# Patient Record
Sex: Male | Born: 1950
Health system: Southern US, Community
[De-identification: ages and names within clinical notes are randomized; demographics above are authoritative.]

## PROBLEM LIST (undated history)

## (undated) DIAGNOSIS — E119 Type 2 diabetes mellitus without complications: Secondary | ICD-10-CM

## (undated) DIAGNOSIS — K219 Gastro-esophageal reflux disease without esophagitis: Secondary | ICD-10-CM

## (undated) DIAGNOSIS — F79 Unspecified intellectual disabilities: Secondary | ICD-10-CM

## (undated) HISTORY — DX: Gastro-esophageal reflux disease without esophagitis: K21.9

## (undated) HISTORY — PX: OTHER SURGICAL HISTORY: SHX169

## (undated) HISTORY — PX: TONSILLECTOMY: SUR1361

## (undated) HISTORY — DX: Type 2 diabetes mellitus without complications: E11.9

## (undated) HISTORY — PX: ANKLE FRACTURE SURGERY: SHX122

## (undated) HISTORY — DX: Unspecified intellectual disabilities: F79

---

## 2005-10-14 ENCOUNTER — Ambulatory Visit: Payer: Self-pay | Admitting: Internal Medicine

## 2005-10-30 HISTORY — PX: ESOPHAGOGASTRODUODENOSCOPY: SHX1529

## 2005-11-03 ENCOUNTER — Ambulatory Visit (HOSPITAL_COMMUNITY): Admission: RE | Admit: 2005-11-03 | Discharge: 2005-11-03 | Payer: Self-pay | Admitting: Internal Medicine

## 2005-11-03 ENCOUNTER — Ambulatory Visit: Payer: Self-pay | Admitting: Internal Medicine

## 2010-12-20 ENCOUNTER — Encounter (INDEPENDENT_AMBULATORY_CARE_PROVIDER_SITE_OTHER): Payer: Self-pay | Admitting: Internal Medicine

## 2011-04-10 ENCOUNTER — Emergency Department (HOSPITAL_COMMUNITY): Payer: Medicare HMO

## 2011-04-10 ENCOUNTER — Emergency Department (HOSPITAL_COMMUNITY)
Admission: EM | Admit: 2011-04-10 | Discharge: 2011-04-10 | Disposition: A | Discharge status: Home / Self Care | Payer: Medicare HMO | Attending: Emergency Medicine | Admitting: Emergency Medicine

## 2011-04-10 DIAGNOSIS — IMO0002 Reserved for concepts with insufficient information to code with codable children: Secondary | ICD-10-CM | POA: Insufficient documentation

## 2011-04-10 DIAGNOSIS — S0990XA Unspecified injury of head, initial encounter: Secondary | ICD-10-CM | POA: Insufficient documentation

## 2011-04-10 DIAGNOSIS — S0180XA Unspecified open wound of other part of head, initial encounter: Secondary | ICD-10-CM | POA: Insufficient documentation

## 2011-04-29 ENCOUNTER — Encounter: Payer: Self-pay | Admitting: Gastroenterology

## 2011-04-29 ENCOUNTER — Ambulatory Visit (INDEPENDENT_AMBULATORY_CARE_PROVIDER_SITE_OTHER): Payer: Medicare HMO | Admitting: Gastroenterology

## 2011-04-29 VITALS — BP 140/77 | HR 66 | Temp 98.4°F | Ht 72.0 in | Wt 197.4 lb

## 2011-04-29 DIAGNOSIS — R197 Diarrhea, unspecified: Secondary | ICD-10-CM

## 2011-04-29 DIAGNOSIS — R1319 Other dysphagia: Secondary | ICD-10-CM

## 2011-04-29 DIAGNOSIS — R1314 Dysphagia, pharyngoesophageal phase: Secondary | ICD-10-CM

## 2011-04-29 DIAGNOSIS — R1310 Dysphagia, unspecified: Secondary | ICD-10-CM | POA: Insufficient documentation

## 2011-04-29 DIAGNOSIS — Z1211 Encounter for screening for malignant neoplasm of colon: Secondary | ICD-10-CM | POA: Insufficient documentation

## 2011-04-29 DIAGNOSIS — K219 Gastro-esophageal reflux disease without esophagitis: Secondary | ICD-10-CM

## 2011-04-29 NOTE — Assessment & Plan Note (Signed)
Chronic postprandial fecal urgency and loose stool. Bowel movement within 45 minutes after each meal. No prior colonoscopy. Recommend colonoscopy primarily for colorectal cancer screening. I have discussed the risks, alternatives, benefits with regards to but not limited to the risk of reaction to medication, bleeding, infection, perforation and the patient is agreeable to proceed. Written consent to be obtained.

## 2011-04-29 NOTE — Progress Notes (Signed)
Primary Care Physician:  Purcell Nails, MD  Primary Gastroenterologist:  Roetta Sessions, MD  Chief Complaint  Patient presents with  . Dysphagia    vomiting when eating sometimes  . Hiatal Hernia  . Diarrhea    HPI:  Henry [name] is a 60 y.o. male to schedule colonoscopy at the request of Dr. Fransico Him. He has never had a colonoscopy. He has problems with postprandial fecal urgency and "blow outs". No melena, brbpr. No real abdominal pain. Appetite good. No weight loss. No heartburn/indigestion. Vomiting most days with meals. Sisters thinks is because of a hiatal hernia. Patient does complain of some dysphagia. Has a history of esophageal stricture requiring dilation. EGD report by Dr. Karilyn Cota back in 2006 mentioned something about achalasia but never had the followup barium pill esophagram.     Current Outpatient Prescriptions  Medication Sig Dispense Refill  . diphenhydramine-acetaminophen (TYLENOL PM) 25-500 MG TABS Take 1 tablet by mouth at bedtime as needed.        Marland Kitchen omeprazole (PRILOSEC) 20 MG capsule Take 20 mg by mouth daily.          Allergies as of 04/29/2011  . (No Known Allergies)    Past Medical History  Diagnosis Date  . Mental disability     Past Surgical History  Procedure Date  . Tonsillectomy   . Cervical fracture     wore halo brace  . Ankle fracture surgery     pin  . Esophagogastroduodenoscopy 12/06    distal esophageal narrowing s/p dilation up to 19mm. ?achalasia?    Family History  Problem Relation Age of Onset  . Colon cancer Neg Hx     History   Social History  . Marital Status: Single    Spouse Name: N/A    Number of Children: 0  . Years of Education: N/A   Occupational History  . unemployed    Social History Main Topics  . Smoking status: Never Smoker   . Smokeless tobacco: Current User    Types: Snuff   Comment: uses dip  . Alcohol Use: No     one beer a week  . Drug Use: No  . Sexually Active: Not on file   Other  Topics Concern  . Not on file   Social History Narrative   Lives with sister.      ROS:  General: Negative for anorexia, weight loss, fever, chills, fatigue, weakness. Eyes: Negative for vision changes.  ENT: Negative for hoarseness, difficulty swallowing , nasal congestion. CV: Negative for chest pain, angina, palpitations, dyspnea on exertion, peripheral edema.  Respiratory: Negative for dyspnea at rest, dyspnea on exertion, cough, sputum, wheezing.  GI: See history of present illness. GU:  Negative for dysuria, hematuria, urinary incontinence, urinary frequency, nocturnal urination.  MS: Negative for joint pain, low back pain.  Derm: Negative for rash or itching.  Neuro: Negative for weakness, abnormal sensation, seizure, frequent headaches, memory loss, confusion.  Psych: Negative for anxiety, depression, suicidal ideation, hallucinations.  Endo: Negative for unusual weight change.  Heme: Negative for bruising or bleeding. Allergy: Negative for rash or hives.    Physical Examination:  BP 140/77  Pulse 66  Temp(Src) 98.4 F (36.9 C) (Temporal)  Ht 6' (1.829 m)  Wt 197 lb 6.4 oz (89.54 kg)  BMI 26.77 kg/m2   General: Well-nourished, well-developed in no acute distress.  Head: Normocephalic, atraumatic.   Eyes: Conjunctiva pink, no icterus. Mouth: Oropharyngeal mucosa moist and pink , no lesions erythema or exudate.  Neck: Supple without thyromegaly, masses, or lymphadenopathy.  Lungs: Clear to auscultation bilaterally.  Heart: Regular rate and rhythm, no murmurs rubs or gallops.  Abdomen: Bowel sounds are normal, nontender, nondistended, no hepatosplenomegaly or masses, no abdominal bruits or    hernia , no rebound or guarding.   Extremities: No lower extremity edema.  Neuro: Alert and oriented x 4 , grossly normal neurologically.  Skin: Warm and dry, no rash or jaundice.   Psych: Alert and cooperative, normal mood and affect.

## 2011-04-29 NOTE — Assessment & Plan Note (Signed)
Postprandial vomiting. Patient mentally disabled but does have a history of subtle stricture. Suspect recurrent esophageal stricture versus achalasia. Patient denies associated abdominal pain. Recommend EGD with dilation in the near future. I have discussed the risks, alternatives, benefits with regards to but not limited to the risk of reaction to medication, bleeding, infection, perforation and the patient is agreeable to proceed. Written consent to be obtained.

## 2011-04-29 NOTE — Assessment & Plan Note (Signed)
Typical GERD symptoms well controlled with PPI. Continue current regimen.

## 2011-04-30 NOTE — Progress Notes (Signed)
Cc to PCP 

## 2011-05-18 ENCOUNTER — Other Ambulatory Visit: Payer: Self-pay | Admitting: Internal Medicine

## 2011-05-18 ENCOUNTER — Ambulatory Visit (HOSPITAL_COMMUNITY)
Admission: RE | Admit: 2011-05-18 | Discharge: 2011-05-18 | Disposition: A | Discharge status: Home / Self Care | Payer: Medicare HMO | Source: Ambulatory Visit | Attending: Internal Medicine | Admitting: Internal Medicine

## 2011-05-18 ENCOUNTER — Encounter: Payer: Medicare HMO | Admitting: Internal Medicine

## 2011-05-18 DIAGNOSIS — K5730 Diverticulosis of large intestine without perforation or abscess without bleeding: Secondary | ICD-10-CM

## 2011-05-18 DIAGNOSIS — Z1211 Encounter for screening for malignant neoplasm of colon: Secondary | ICD-10-CM

## 2011-05-18 DIAGNOSIS — R197 Diarrhea, unspecified: Secondary | ICD-10-CM | POA: Insufficient documentation

## 2011-05-18 DIAGNOSIS — K222 Esophageal obstruction: Secondary | ICD-10-CM | POA: Insufficient documentation

## 2011-05-18 DIAGNOSIS — R1310 Dysphagia, unspecified: Secondary | ICD-10-CM

## 2011-05-18 DIAGNOSIS — K219 Gastro-esophageal reflux disease without esophagitis: Secondary | ICD-10-CM

## 2011-05-18 DIAGNOSIS — K2980 Duodenitis without bleeding: Secondary | ICD-10-CM | POA: Insufficient documentation

## 2011-05-18 HISTORY — PX: COLONOSCOPY: SHX174

## 2011-05-18 HISTORY — PX: ESOPHAGOGASTRODUODENOSCOPY: SHX1529

## 2011-06-15 NOTE — Op Note (Signed)
Henry [name], Henry [name]                  ACCOUNT NO.:  [id number]  MEDICAL RECORD NO.:  [id number]  LOCATION:  [facility]                          FACILITY:  [facility]  PHYSICIAN:  R. Roetta Sessions, MD FACP FACGDATE OF BIRTH:  12-27-1950  DATE OF PROCEDURE:  05/18/2011 DATE OF DISCHARGE:                              OPERATIVE REPORT   PROCEDURE:  Esophagogastroduodenoscopy with Elease Hashimoto dilation followed by a small bowel biopsy, followed by ileocolonoscopy screening.  INDICATIONS FOR PROCEDURE:  A 60 year old gentleman with intermittent esophageal dysphagia in the background setting, on background of chronic GERD, well controlled with omeprazole 20 mg orally daily.  Here for further evaluation of intermittent esophageal dysphagia, intermittent postprandial loose stools, but no chronic diarrhea, hematochezia, or other change in bowel function.  No family history of colon cancer.  No prior colonoscopy.  EGD and colonoscopy now being done.  Risks, benefits, limitations, alternatives, and imponderables have been discussed, questions answered.  Please see the documentation medical record.  PROCEDURE NOTE:  O2 saturation, blood pressure, pulse, and respirations were monitored throughout the entirety of the procedure.  CONSCIOUS SEDATION:  Versed 7 mg IV and Demerol 50 mg IV in divided doses.  INSTRUMENT:  Pentax video chip system.  Cetacaine spray for topical pharyngeal anesthesia.  FINDINGS:  Examination of the tubular esophagus revealed normal mucosa. There was a submucosal ring at the GE junction which the scope "caught on" on passage of the EG junction and this appeared to be benign finding.  The EG junction was patent at this level, however.  Stomach: Gastric cavity was emptied and insufflated well with air.  Thorough examination of the gastric mucosa including retroflex view of proximal stomach and esophagogastric junction demonstrated a 45-cm hiatal hernia only.  Pylorus was patent, easily  traversed.  Examination of the bulb, second and third portion revealed a questionable scalloping of some of the tips of folds, otherwise mucosa appeared normal. Therapeutic/diagnostic maneuvers performed.  Scope was withdrawn.  A 61- French Maloney dilator passed full insertion and a 58-French Maloney dilator subsequently passed with slight resistance.  Look back revealed rupturing of the ring at the 5 o'clock position without apparent complication and minimal bleeding.  Subsequent biopsies of the duodenal mucosa were taken for histology.  The patient tolerated this procedure and was prepared for colonoscopy.  Digital rectal exam revealed no abnormalities.  Endoscopic findings, prep was good.  Colon:  Colonic mucosa was surveyed from the rectosigmoid junction through the left transverse right colon to the appendiceal orifice, ileocecal valve/cecum.  These structures were well seen and photographed for the record.  Terminal was intubated to 5 cm.  From this level, scope was slowly and cautiously withdrawn.  All previously mentioned mucosal surfaces were seen.  The patient had left-sided diverticulum.  Remaining colonic mucosa appeared normal.  There were 3 capsule fragments in the base of the cecum which were not totally washout or I could not pull them way but the cecal mucosa was felt to have been seen adequately. The remainder of the colonic mucosa appeared normal as did terminal mucosa.  The scope was pulled down the rectum where thorough examination of the rectal mucosa including retroflex  view of the anal verge demonstrated no abnormalities.  The patient tolerated the procedure well.  Cecal withdrawal time 7 minutes.  IMPRESSION: 1. Esophagogastroduodenoscopy, submucosal, esophageal ring in the GE     junction status post disruption as described above, otherwise     normal-appearing esophagus. 2. Hiatal hernia, otherwise normal stomach, questions scalloping at D2     and D3,  status post biopsy.  Colonoscopy findings, normal rectum,     left-sided diverticula.  Remaining of the colonic mucosa and     terminal mucosa appeared normal.  RECOMMENDATIONS: 1. Continue omeprazole 20 mg orally daily. 2. Add Align probiotic 1 capsule daily to regimen to help normalize     bowel function. 3. Repeat screening colonoscopy in 10 years. 4. Literature on diverticulosis, GERD and hiatal hernia provided to     Mr. Orea 5. Follow up on path.     Jonathon Bellows, MD Caleen Essex     RMR/MEDQ  D:  05/18/2011  T:  05/18/2011  Job:  [id number]  cc:   Purcell Nails, MD Fax: [phone]  Electronically Signed by Lorrin Goodell M.D. on 06/15/2011 09:18:35 AM

## 2017-02-04 DIAGNOSIS — J189 Pneumonia, unspecified organism: Secondary | ICD-10-CM | POA: Diagnosis not present

## 2017-02-04 DIAGNOSIS — N390 Urinary tract infection, site not specified: Secondary | ICD-10-CM | POA: Diagnosis not present

## 2017-02-08 DIAGNOSIS — J189 Pneumonia, unspecified organism: Secondary | ICD-10-CM | POA: Diagnosis not present

## 2017-02-08 DIAGNOSIS — H6123 Impacted cerumen, bilateral: Secondary | ICD-10-CM | POA: Diagnosis not present

## 2017-02-08 DIAGNOSIS — N390 Urinary tract infection, site not specified: Secondary | ICD-10-CM | POA: Diagnosis not present

## 2017-03-05 DIAGNOSIS — L728 Other follicular cysts of the skin and subcutaneous tissue: Secondary | ICD-10-CM | POA: Diagnosis not present

## 2017-03-05 DIAGNOSIS — L814 Other melanin hyperpigmentation: Secondary | ICD-10-CM | POA: Diagnosis not present

## 2017-03-05 DIAGNOSIS — D485 Neoplasm of uncertain behavior of skin: Secondary | ICD-10-CM | POA: Diagnosis not present

## 2017-03-05 DIAGNOSIS — L02411 Cutaneous abscess of right axilla: Secondary | ICD-10-CM | POA: Diagnosis not present

## 2017-03-05 DIAGNOSIS — L570 Actinic keratosis: Secondary | ICD-10-CM | POA: Diagnosis not present

## 2017-04-15 DIAGNOSIS — Z6826 Body mass index (BMI) 26.0-26.9, adult: Secondary | ICD-10-CM | POA: Diagnosis not present

## 2017-04-15 DIAGNOSIS — C4490 Unspecified malignant neoplasm of skin, unspecified: Secondary | ICD-10-CM | POA: Diagnosis not present

## 2017-04-21 DIAGNOSIS — L03116 Cellulitis of left lower limb: Secondary | ICD-10-CM | POA: Diagnosis not present

## 2017-04-21 DIAGNOSIS — Z6827 Body mass index (BMI) 27.0-27.9, adult: Secondary | ICD-10-CM | POA: Diagnosis not present

## 2017-05-06 DIAGNOSIS — E162 Hypoglycemia, unspecified: Secondary | ICD-10-CM | POA: Diagnosis not present

## 2017-05-06 DIAGNOSIS — K219 Gastro-esophageal reflux disease without esophagitis: Secondary | ICD-10-CM | POA: Diagnosis not present

## 2017-05-10 DIAGNOSIS — F79 Unspecified intellectual disabilities: Secondary | ICD-10-CM | POA: Diagnosis not present

## 2017-05-10 DIAGNOSIS — Z0001 Encounter for general adult medical examination with abnormal findings: Secondary | ICD-10-CM | POA: Diagnosis not present

## 2017-05-10 DIAGNOSIS — K219 Gastro-esophageal reflux disease without esophagitis: Secondary | ICD-10-CM | POA: Diagnosis not present

## 2017-05-10 DIAGNOSIS — Z6827 Body mass index (BMI) 27.0-27.9, adult: Secondary | ICD-10-CM | POA: Diagnosis not present

## 2017-05-26 ENCOUNTER — Telehealth: Payer: Self-pay

## 2017-05-26 NOTE — Telephone Encounter (Signed)
[phone] patient sister freda called to schedule his tcs   Received letter

## 2017-05-27 NOTE — Telephone Encounter (Signed)
I spoke to [place] and she said pt is not having any problems now and no family hx of colon cancer.  She was just not sure when his next colonoscopy was due. Last one was done by Dr. Gala Romney on 05/18/2011 and he recommended the next one in 10 years. She is aware pt is on Recall.

## 2017-07-08 DIAGNOSIS — C4442 Squamous cell carcinoma of skin of scalp and neck: Secondary | ICD-10-CM | POA: Diagnosis not present

## 2017-07-08 DIAGNOSIS — Z6827 Body mass index (BMI) 27.0-27.9, adult: Secondary | ICD-10-CM | POA: Diagnosis not present

## 2017-10-04 DIAGNOSIS — M25551 Pain in right hip: Secondary | ICD-10-CM | POA: Diagnosis not present

## 2017-10-04 DIAGNOSIS — Z6827 Body mass index (BMI) 27.0-27.9, adult: Secondary | ICD-10-CM | POA: Diagnosis not present

## 2018-05-19 DIAGNOSIS — E162 Hypoglycemia, unspecified: Secondary | ICD-10-CM | POA: Diagnosis not present

## 2018-05-19 DIAGNOSIS — K219 Gastro-esophageal reflux disease without esophagitis: Secondary | ICD-10-CM | POA: Diagnosis not present

## 2018-05-19 DIAGNOSIS — C4442 Squamous cell carcinoma of skin of scalp and neck: Secondary | ICD-10-CM | POA: Diagnosis not present

## 2018-05-19 DIAGNOSIS — F79 Unspecified intellectual disabilities: Secondary | ICD-10-CM | POA: Diagnosis not present

## 2018-05-24 DIAGNOSIS — Z6828 Body mass index (BMI) 28.0-28.9, adult: Secondary | ICD-10-CM | POA: Diagnosis not present

## 2018-05-24 DIAGNOSIS — Z0001 Encounter for general adult medical examination with abnormal findings: Secondary | ICD-10-CM | POA: Diagnosis not present

## 2018-05-24 DIAGNOSIS — K219 Gastro-esophageal reflux disease without esophagitis: Secondary | ICD-10-CM | POA: Diagnosis not present

## 2018-05-24 DIAGNOSIS — Z23 Encounter for immunization: Secondary | ICD-10-CM | POA: Diagnosis not present

## 2018-05-24 DIAGNOSIS — Z1339 Encounter for screening examination for other mental health and behavioral disorders: Secondary | ICD-10-CM | POA: Diagnosis not present

## 2018-05-24 DIAGNOSIS — F79 Unspecified intellectual disabilities: Secondary | ICD-10-CM | POA: Diagnosis not present

## 2019-06-14 DIAGNOSIS — F79 Unspecified intellectual disabilities: Secondary | ICD-10-CM | POA: Diagnosis not present

## 2019-06-14 DIAGNOSIS — K219 Gastro-esophageal reflux disease without esophagitis: Secondary | ICD-10-CM | POA: Diagnosis not present

## 2019-06-14 DIAGNOSIS — E162 Hypoglycemia, unspecified: Secondary | ICD-10-CM | POA: Diagnosis not present

## 2019-06-21 DIAGNOSIS — C4442 Squamous cell carcinoma of skin of scalp and neck: Secondary | ICD-10-CM | POA: Diagnosis not present

## 2019-06-21 DIAGNOSIS — Z6828 Body mass index (BMI) 28.0-28.9, adult: Secondary | ICD-10-CM | POA: Diagnosis not present

## 2019-06-21 DIAGNOSIS — Z0001 Encounter for general adult medical examination with abnormal findings: Secondary | ICD-10-CM | POA: Diagnosis not present

## 2019-06-21 DIAGNOSIS — H9190 Unspecified hearing loss, unspecified ear: Secondary | ICD-10-CM | POA: Diagnosis not present

## 2019-06-21 DIAGNOSIS — K449 Diaphragmatic hernia without obstruction or gangrene: Secondary | ICD-10-CM | POA: Diagnosis not present

## 2019-06-21 DIAGNOSIS — K219 Gastro-esophageal reflux disease without esophagitis: Secondary | ICD-10-CM | POA: Diagnosis not present

## 2019-06-21 DIAGNOSIS — R7303 Prediabetes: Secondary | ICD-10-CM | POA: Diagnosis not present

## 2019-06-21 DIAGNOSIS — K5730 Diverticulosis of large intestine without perforation or abscess without bleeding: Secondary | ICD-10-CM | POA: Diagnosis not present

## 2019-10-25 ENCOUNTER — Other Ambulatory Visit: Payer: Self-pay

## 2019-10-31 HISTORY — PX: ESOPHAGOGASTRODUODENOSCOPY: SHX1529

## 2019-11-24 DIAGNOSIS — Z20828 Contact with and (suspected) exposure to other viral communicable diseases: Secondary | ICD-10-CM | POA: Diagnosis not present

## 2019-11-24 DIAGNOSIS — R633 Feeding difficulties: Secondary | ICD-10-CM | POA: Diagnosis not present

## 2019-11-24 DIAGNOSIS — H109 Unspecified conjunctivitis: Secondary | ICD-10-CM | POA: Diagnosis not present

## 2019-11-24 DIAGNOSIS — Z79899 Other long term (current) drug therapy: Secondary | ICD-10-CM | POA: Diagnosis not present

## 2019-11-24 DIAGNOSIS — M6281 Muscle weakness (generalized): Secondary | ICD-10-CM | POA: Diagnosis not present

## 2019-11-24 DIAGNOSIS — K219 Gastro-esophageal reflux disease without esophagitis: Secondary | ICD-10-CM | POA: Diagnosis not present

## 2019-11-24 DIAGNOSIS — K224 Dyskinesia of esophagus: Secondary | ICD-10-CM | POA: Diagnosis not present

## 2019-11-24 DIAGNOSIS — R29810 Facial weakness: Secondary | ICD-10-CM | POA: Diagnosis not present

## 2019-11-24 DIAGNOSIS — R2990 Unspecified symptoms and signs involving the nervous system: Secondary | ICD-10-CM | POA: Diagnosis not present

## 2019-11-24 DIAGNOSIS — Z23 Encounter for immunization: Secondary | ICD-10-CM | POA: Diagnosis not present

## 2019-11-24 DIAGNOSIS — R1312 Dysphagia, oropharyngeal phase: Secondary | ICD-10-CM | POA: Diagnosis not present

## 2019-11-24 DIAGNOSIS — R1310 Dysphagia, unspecified: Secondary | ICD-10-CM | POA: Diagnosis not present

## 2019-11-24 DIAGNOSIS — R7303 Prediabetes: Secondary | ICD-10-CM | POA: Diagnosis not present

## 2019-11-24 DIAGNOSIS — G510 Bell's palsy: Secondary | ICD-10-CM | POA: Diagnosis not present

## 2019-11-24 DIAGNOSIS — K228 Other specified diseases of esophagus: Secondary | ICD-10-CM | POA: Diagnosis not present

## 2019-11-24 DIAGNOSIS — Z8249 Family history of ischemic heart disease and other diseases of the circulatory system: Secondary | ICD-10-CM | POA: Diagnosis not present

## 2019-11-24 DIAGNOSIS — F17290 Nicotine dependence, other tobacco product, uncomplicated: Secondary | ICD-10-CM | POA: Diagnosis not present

## 2019-11-24 DIAGNOSIS — H6123 Impacted cerumen, bilateral: Secondary | ICD-10-CM | POA: Diagnosis not present

## 2019-11-24 DIAGNOSIS — F79 Unspecified intellectual disabilities: Secondary | ICD-10-CM | POA: Diagnosis not present

## 2019-11-24 DIAGNOSIS — R4189 Other symptoms and signs involving cognitive functions and awareness: Secondary | ICD-10-CM | POA: Diagnosis not present

## 2019-11-24 DIAGNOSIS — I6523 Occlusion and stenosis of bilateral carotid arteries: Secondary | ICD-10-CM | POA: Diagnosis not present

## 2019-11-24 DIAGNOSIS — Z83438 Family history of other disorder of lipoprotein metabolism and other lipidemia: Secondary | ICD-10-CM | POA: Diagnosis not present

## 2019-11-24 DIAGNOSIS — K449 Diaphragmatic hernia without obstruction or gangrene: Secondary | ICD-10-CM | POA: Diagnosis not present

## 2019-11-24 DIAGNOSIS — R29818 Other symptoms and signs involving the nervous system: Secondary | ICD-10-CM | POA: Diagnosis not present

## 2019-11-25 DIAGNOSIS — R29810 Facial weakness: Secondary | ICD-10-CM | POA: Diagnosis not present

## 2019-11-25 DIAGNOSIS — F79 Unspecified intellectual disabilities: Secondary | ICD-10-CM | POA: Diagnosis not present

## 2019-11-25 DIAGNOSIS — R29818 Other symptoms and signs involving the nervous system: Secondary | ICD-10-CM | POA: Diagnosis not present

## 2019-11-25 DIAGNOSIS — R633 Feeding difficulties: Secondary | ICD-10-CM | POA: Diagnosis not present

## 2019-11-25 DIAGNOSIS — R1310 Dysphagia, unspecified: Secondary | ICD-10-CM | POA: Diagnosis not present

## 2019-11-25 DIAGNOSIS — I6523 Occlusion and stenosis of bilateral carotid arteries: Secondary | ICD-10-CM | POA: Diagnosis not present

## 2019-11-26 DIAGNOSIS — R29810 Facial weakness: Secondary | ICD-10-CM | POA: Diagnosis not present

## 2019-11-27 DIAGNOSIS — K219 Gastro-esophageal reflux disease without esophagitis: Secondary | ICD-10-CM | POA: Diagnosis not present

## 2019-11-27 DIAGNOSIS — I6523 Occlusion and stenosis of bilateral carotid arteries: Secondary | ICD-10-CM | POA: Diagnosis not present

## 2019-11-27 DIAGNOSIS — G510 Bell's palsy: Secondary | ICD-10-CM | POA: Diagnosis not present

## 2019-11-27 DIAGNOSIS — R633 Feeding difficulties: Secondary | ICD-10-CM | POA: Diagnosis not present

## 2019-11-27 DIAGNOSIS — K224 Dyskinesia of esophagus: Secondary | ICD-10-CM | POA: Diagnosis not present

## 2019-11-27 DIAGNOSIS — R29810 Facial weakness: Secondary | ICD-10-CM | POA: Diagnosis not present

## 2019-11-27 DIAGNOSIS — R4189 Other symptoms and signs involving cognitive functions and awareness: Secondary | ICD-10-CM | POA: Diagnosis not present

## 2019-11-27 DIAGNOSIS — R1310 Dysphagia, unspecified: Secondary | ICD-10-CM | POA: Diagnosis not present

## 2019-11-27 DIAGNOSIS — R29818 Other symptoms and signs involving the nervous system: Secondary | ICD-10-CM | POA: Diagnosis not present

## 2019-11-28 DIAGNOSIS — R1310 Dysphagia, unspecified: Secondary | ICD-10-CM | POA: Diagnosis not present

## 2019-11-28 DIAGNOSIS — K228 Other specified diseases of esophagus: Secondary | ICD-10-CM | POA: Diagnosis not present

## 2019-11-28 DIAGNOSIS — R29810 Facial weakness: Secondary | ICD-10-CM | POA: Diagnosis not present

## 2019-11-28 HISTORY — PX: ESOPHAGOGASTRODUODENOSCOPY: SHX1529

## 2020-01-04 DIAGNOSIS — R1310 Dysphagia, unspecified: Secondary | ICD-10-CM | POA: Diagnosis not present

## 2020-01-04 DIAGNOSIS — Z6828 Body mass index (BMI) 28.0-28.9, adult: Secondary | ICD-10-CM | POA: Diagnosis not present

## 2020-01-04 DIAGNOSIS — G510 Bell's palsy: Secondary | ICD-10-CM | POA: Diagnosis not present

## 2020-01-04 DIAGNOSIS — H18892 Other specified disorders of cornea, left eye: Secondary | ICD-10-CM | POA: Diagnosis not present

## 2020-01-04 DIAGNOSIS — K228 Other specified diseases of esophagus: Secondary | ICD-10-CM | POA: Diagnosis not present

## 2020-01-04 DIAGNOSIS — K219 Gastro-esophageal reflux disease without esophagitis: Secondary | ICD-10-CM | POA: Diagnosis not present

## 2020-01-15 ENCOUNTER — Telehealth (HOSPITAL_COMMUNITY): Payer: Self-pay | Admitting: Speech Pathology

## 2020-01-15 NOTE — Telephone Encounter (Signed)
L/m to cx this apptment due to needing clairification from MD it patient needs a MBSS.

## 2020-01-15 NOTE — Telephone Encounter (Signed)
Called to cx and rs once we get clear inforamtion from MD no answer will call again lat

## 2020-01-18 ENCOUNTER — Encounter (HOSPITAL_COMMUNITY): Payer: Self-pay

## 2020-01-18 ENCOUNTER — Ambulatory Visit (HOSPITAL_COMMUNITY): Payer: PPO | Admitting: Speech Pathology

## 2020-01-18 ENCOUNTER — Ambulatory Visit (HOSPITAL_COMMUNITY): Payer: PPO | Attending: Family Medicine | Admitting: Speech Pathology

## 2020-01-18 DIAGNOSIS — R1319 Other dysphagia: Secondary | ICD-10-CM | POA: Insufficient documentation

## 2020-01-25 ENCOUNTER — Encounter (HOSPITAL_COMMUNITY): Payer: Self-pay | Admitting: Speech Pathology

## 2020-01-25 ENCOUNTER — Other Ambulatory Visit: Payer: Self-pay

## 2020-01-25 ENCOUNTER — Ambulatory Visit (HOSPITAL_COMMUNITY): Payer: PPO | Admitting: Speech Pathology

## 2020-01-25 DIAGNOSIS — R1319 Other dysphagia: Secondary | ICD-10-CM

## 2020-01-25 NOTE — Therapy (Signed)
[place], [place], [address] Phone: [phone]   Fax:  [phone]  Speech Language Pathology Evaluation/Clinical Swallow Evaluation  Patient Details  Name: Henry [name] Date of Birth: 19-Apr-1951 No data recorded  Encounter Date: 01/25/2020  End of Session - 01/25/20 1158    Visit Number  1    Number of Visits  1    Authorization Type  [employer]    SLP Start Time  1000    SLP Stop Time   1032    SLP Time Calculation (min)  32 min    Activity Tolerance  Patient tolerated treatment well       Past Medical History:  Diagnosis Date  . Mental disability     Past Surgical History:  Procedure Laterality Date  . ANKLE FRACTURE SURGERY     pin  . cervical fracture     wore halo brace  . ESOPHAGOGASTRODUODENOSCOPY  12/06   distal esophageal narrowing s/p dilation up to 69mm. ?achalasia?  . TONSILLECTOMY      There were no vitals filed for this visit.  Subjective Assessment - 01/25/20 1002    Subjective  "Sometimes I throw up after I eat."    Patient is accompained by:  Family member   Sister   Currently in Pain?  No/denies       Prior Functional Status - 01/25/20 1136      Prior Functional Status   Cognitive/Linguistic Baseline  Baseline deficits    Baseline deficit details  h/o developmental cognitive impairment    Available Help at Discharge  Family      General - 01/25/20 1150      General Information   Date of Onset  11/27/19    HPI  Henry [name] is a 69 yo male who was referred by Dr. Judd Lien for a clinical swallow evaluation due to h/o Bell's Palsy in December with completion of MBSS (11/27/19) and EGD (11/28/19) at [facility] in [place]). The MBSS reportedly showed penetration without aspiration and asymmetry with patulous pyriform recesses. EGD showed the lumen of the upper third of the esophagus and middle third of the esophagus moderately  dilated, no dilatable stricture seen. A recommendation for consideration of esophageal manometry was made to rule out achalasia vs nonspecific esophageal dysmotility. Pt was discharged home on a puree diet with NTL, however family resumed soft diet and thin liquids at the end of January. Pt is accompanied to today's evaluation by his sister, caregiver. Pt is HOH and has limited literacy skills. His sister reports that they supervise Henry [name] during meals to remind him to slow down. GI consult with Dr. Laural Golden is pending, per sister.    Type of Study  Bedside Swallow Evaluation    Previous Swallow Assessment  MBSS at [facility] 11/27/19 and EGD 11/28/19    Diet Prior to this Study  Dysphagia 3 (soft);Thin liquids    Temperature Spikes Noted  No    Respiratory Status  Room air    History of Recent Intubation  No    Behavior/Cognition  Alert;Cooperative;Pleasant mood    Oral Cavity Assessment  Within Functional Limits    Oral Care Completed by SLP  No    Oral Cavity - Dentition  Dentures, top;Dentures, bottom    Vision  Functional for self-feeding    Self-Feeding Abilities  Able to feed self    Patient Positioning  Upright in chair  Baseline Vocal Quality  Normal    Volitional Cough  Strong    Volitional Swallow  Able to elicit       Oral Motor/Sensory Function - 01/25/20 1156      Oral Motor/Sensory Function   Overall Oral Motor/Sensory Function  Within functional limits      Ice Chips - 01/25/20 1157      Ice Chips   Ice chips  Not tested      Thin Liquid - 01/25/20 1157      Thin Liquid   Thin Liquid  Within functional limits    Presentation  Cup;Straw;Self Fed        Puree - 01/25/20 1157      Puree   Presentation  Self Fed;Spoon      Solid - 01/25/20 1157      Solid   Solid  Within functional limits    Presentation  Self Fed      EATING ASSESSMENT TOOL (EAT-10)  The patient was asked to rate to what extent the following statements are  problematic on a scale of 0-4. 0 = No problem; 4 = Severe problem. A total score of 3 or higher is considered abnormal. My swallowing problem has caused me to lose weight. 0 My swallowing problem interferes with my ability to go out for meals. 2 Swallowing liquids takes extra effort. 0 Swallowing solids takes extra effort. 1 Swallowing pills takes extra effort. 1 Swallowing is painful. 0  The pleasure of eating is affected by my swallowing. 1 When I swallow food sticks in my throat. 0 I cough when I eat. 2 Swallowing is stressful. 0  TOTAL SCORE: 7 [x]  Abnormal (raw score >3) []  Normal   Pt's sister assisted in filling out the EAT-10 above  SLP Education - 01/25/20 1135    Education Details  Written and verbal information provided regarding aspiration, dysphagia, and esophageal precautions    Person(s) Educated  Patient;Caregiver(s)   Sister   Methods  Explanation;Handout    Comprehension  Verbalized understanding       SLP Short Term Goals - 01/25/20 1202      SLP SHORT TERM GOAL #1   Title  N/A; evaluation only         Plan - 01/25/20 1159    Clinical Impression Statement  Clinical swallow evaluation completed in room with sister present. Pt is HOH, limited literacy skills, and has developmental cognitive impairment, and has intermittent supervsion/assist from family primarily to encourage safe swallow precautions. Previous records from OSH were reviewed via [facility] tab (see above for test results). Oral motor examination is remarkable for U/L dentures and mild reduced coordination with lingual lateralization. Pt had previous neck injury and has reduced neck movement now. Pt shows no overt signs or symptoms of aspiration during presentation of thin liquids, puree, and regular textures. He reports occasional "vomiting" after consuming solid foods (sounds like regurgitation).   Pt and sister were provided information regarding aspiration, dysphagia, soft  solid recommendations, and reflux precautions. SLP suggested that family focus on having him eat slowly and take small bites to mitigate cognitive overload. Pt and family were appreciative of recommendations. Pt's symptoms appear more consistent with esophageal dysphagia and f/u with GI is recommended. His sister indicates that Henry [name] has had 4 episodes of PNA in the past few years, which could be due to post prandial aspiration given esophageal concerns. SLP can complete MBSS if MD desires, however do not feel it is indicated  at this time. Pt/family were given my contact information should they have further questions or concerns.    Treatment/Interventions  Patient/family education;SLP instruction and feedback;Aspiration precaution training    Potential to Achieve Goals  Good    SLP Home Exercise Plan  N/A; Evaluation only    Consulted and Agree with Plan of Care  Patient;Family member/caregiver    Family Member Consulted  Sister       Patient will benefit from skilled therapeutic intervention in order to improve the following deficits and impairments:   Other dysphagia    Problem List Patient Active Problem List   Diagnosis Date Noted  . GERD (gastroesophageal reflux disease) 04/29/2011  . Colon cancer screening 04/29/2011  . Esophageal dysphagia 04/29/2011  . Diarrhea 04/29/2011   Thank you,  Genene Churn, [place]  [facility] 01/25/2020, 12:03 PM  [place] [address] [place], [place], [address] Phone: [phone]   Fax:  [phone]  Name: Henry [name] MRN: [name] Date of Birth: [birth year]

## 2020-03-04 ENCOUNTER — Encounter (INDEPENDENT_AMBULATORY_CARE_PROVIDER_SITE_OTHER): Payer: Self-pay | Admitting: Gastroenterology

## 2020-03-04 NOTE — Progress Notes (Signed)
Patient needs office visit regarding dysphagia

## 2020-06-10 ENCOUNTER — Ambulatory Visit (INDEPENDENT_AMBULATORY_CARE_PROVIDER_SITE_OTHER): Payer: PPO | Admitting: Gastroenterology

## 2020-06-21 DIAGNOSIS — K219 Gastro-esophageal reflux disease without esophagitis: Secondary | ICD-10-CM | POA: Diagnosis not present

## 2020-06-21 DIAGNOSIS — R5382 Chronic fatigue, unspecified: Secondary | ICD-10-CM | POA: Diagnosis not present

## 2020-06-21 DIAGNOSIS — Z13220 Encounter for screening for lipoid disorders: Secondary | ICD-10-CM | POA: Diagnosis not present

## 2020-06-21 DIAGNOSIS — K5730 Diverticulosis of large intestine without perforation or abscess without bleeding: Secondary | ICD-10-CM | POA: Diagnosis not present

## 2020-06-21 DIAGNOSIS — G510 Bell's palsy: Secondary | ICD-10-CM | POA: Diagnosis not present

## 2020-06-21 DIAGNOSIS — R7303 Prediabetes: Secondary | ICD-10-CM | POA: Diagnosis not present

## 2020-06-25 DIAGNOSIS — F79 Unspecified intellectual disabilities: Secondary | ICD-10-CM | POA: Diagnosis not present

## 2020-06-25 DIAGNOSIS — Z0001 Encounter for general adult medical examination with abnormal findings: Secondary | ICD-10-CM | POA: Diagnosis not present

## 2020-06-25 DIAGNOSIS — E1165 Type 2 diabetes mellitus with hyperglycemia: Secondary | ICD-10-CM | POA: Diagnosis not present

## 2020-06-25 DIAGNOSIS — R1310 Dysphagia, unspecified: Secondary | ICD-10-CM | POA: Diagnosis not present

## 2020-06-25 DIAGNOSIS — G510 Bell's palsy: Secondary | ICD-10-CM | POA: Diagnosis not present

## 2020-06-25 DIAGNOSIS — H6123 Impacted cerumen, bilateral: Secondary | ICD-10-CM | POA: Diagnosis not present

## 2020-06-25 DIAGNOSIS — Z23 Encounter for immunization: Secondary | ICD-10-CM | POA: Diagnosis not present

## 2020-06-25 DIAGNOSIS — T162XXA Foreign body in left ear, initial encounter: Secondary | ICD-10-CM | POA: Diagnosis not present

## 2021-01-20 DIAGNOSIS — E162 Hypoglycemia, unspecified: Secondary | ICD-10-CM | POA: Diagnosis not present

## 2021-01-20 DIAGNOSIS — Z13220 Encounter for screening for lipoid disorders: Secondary | ICD-10-CM | POA: Diagnosis not present

## 2021-01-20 DIAGNOSIS — K219 Gastro-esophageal reflux disease without esophagitis: Secondary | ICD-10-CM | POA: Diagnosis not present

## 2021-01-20 DIAGNOSIS — R5382 Chronic fatigue, unspecified: Secondary | ICD-10-CM | POA: Diagnosis not present

## 2021-01-20 DIAGNOSIS — E1165 Type 2 diabetes mellitus with hyperglycemia: Secondary | ICD-10-CM | POA: Diagnosis not present

## 2021-01-20 DIAGNOSIS — R7303 Prediabetes: Secondary | ICD-10-CM | POA: Diagnosis not present

## 2021-02-28 DIAGNOSIS — R1310 Dysphagia, unspecified: Secondary | ICD-10-CM | POA: Diagnosis not present

## 2021-02-28 DIAGNOSIS — Z8616 Personal history of COVID-19: Secondary | ICD-10-CM | POA: Diagnosis not present

## 2021-02-28 DIAGNOSIS — K219 Gastro-esophageal reflux disease without esophagitis: Secondary | ICD-10-CM | POA: Diagnosis not present

## 2021-02-28 DIAGNOSIS — Z6826 Body mass index (BMI) 26.0-26.9, adult: Secondary | ICD-10-CM | POA: Diagnosis not present

## 2021-02-28 DIAGNOSIS — K5730 Diverticulosis of large intestine without perforation or abscess without bleeding: Secondary | ICD-10-CM | POA: Diagnosis not present

## 2021-02-28 DIAGNOSIS — E1165 Type 2 diabetes mellitus with hyperglycemia: Secondary | ICD-10-CM | POA: Diagnosis not present

## 2021-02-28 DIAGNOSIS — H9190 Unspecified hearing loss, unspecified ear: Secondary | ICD-10-CM | POA: Diagnosis not present

## 2021-02-28 DIAGNOSIS — F79 Unspecified intellectual disabilities: Secondary | ICD-10-CM | POA: Diagnosis not present

## 2021-03-03 ENCOUNTER — Encounter: Payer: Self-pay | Admitting: Internal Medicine

## 2021-05-26 ENCOUNTER — Encounter: Payer: Self-pay | Admitting: Internal Medicine

## 2021-07-04 DIAGNOSIS — R5382 Chronic fatigue, unspecified: Secondary | ICD-10-CM | POA: Diagnosis not present

## 2021-07-04 DIAGNOSIS — E162 Hypoglycemia, unspecified: Secondary | ICD-10-CM | POA: Diagnosis not present

## 2021-07-18 DIAGNOSIS — K219 Gastro-esophageal reflux disease without esophagitis: Secondary | ICD-10-CM | POA: Diagnosis not present

## 2021-07-18 DIAGNOSIS — Z6827 Body mass index (BMI) 27.0-27.9, adult: Secondary | ICD-10-CM | POA: Diagnosis not present

## 2021-07-18 DIAGNOSIS — R350 Frequency of micturition: Secondary | ICD-10-CM | POA: Diagnosis not present

## 2021-07-18 DIAGNOSIS — F79 Unspecified intellectual disabilities: Secondary | ICD-10-CM | POA: Diagnosis not present

## 2021-07-18 DIAGNOSIS — H9190 Unspecified hearing loss, unspecified ear: Secondary | ICD-10-CM | POA: Diagnosis not present

## 2021-07-18 DIAGNOSIS — Z1331 Encounter for screening for depression: Secondary | ICD-10-CM | POA: Diagnosis not present

## 2021-07-18 DIAGNOSIS — Z1389 Encounter for screening for other disorder: Secondary | ICD-10-CM | POA: Diagnosis not present

## 2021-07-18 DIAGNOSIS — E1165 Type 2 diabetes mellitus with hyperglycemia: Secondary | ICD-10-CM | POA: Diagnosis not present

## 2021-10-04 NOTE — Progress Notes (Deleted)
Referring Provider: Curlene Labrum, MD Primary Care Physician:  Curlene Labrum, MD Primary Gastroenterologist:  Dr. Gala Romney  No chief complaint on file.   HPI:   Henry [name] is a 70 y.o. male with intellectual disability, history of GERD, dysphagia, postprandial loose stools, presenting today at the request of Dr. Judd Lien for consult colonoscopy and heartburn.  We have not seen patient since the EGD and colonoscopy in 2012.  EGD 05/18/2011: Submucosal ring at GE junction s/p dilation, hiatal hernia, normal examined stomach, questionable scalloping at D2 and D3 s/p biopsy.  Pathology with chronic duodenitis consistent with peptic duodenitis. Colonoscopy 05/18/2011: Left sided diverticula, otherwise normal exam.  Repeat in 10 years.  Reviewed records in care everywhere: Modified barium swallow December 2020 with [facility] with transient penetration and no aspiration, asymmetric patulous piriform recess, significant residual barium within a mildly dilated thoracic esophagus which could represent a distal stricture.  [facility]phagia revealed moderate dilation of upper third of the esophagus and middle third of the esophagus s/p biopsied, otherwise normal exam.  Recommended considering manometry to rule out achalasia versus BPE. No dilation performed.    Today:        Past Medical History:  Diagnosis Date   Mental disability     Past Surgical History:  Procedure Laterality Date   ANKLE FRACTURE SURGERY     pin   cervical fracture     wore halo brace   ESOPHAGOGASTRODUODENOSCOPY  12/06   distal esophageal narrowing s/p dilation up to 64mm. ?achalasia?   TONSILLECTOMY      Current Outpatient Medications  Medication Sig Dispense Refill   diphenhydramine-acetaminophen (TYLENOL PM) 25-500 MG TABS Take 1 tablet by mouth at bedtime as needed.       omeprazole (PRILOSEC) 20 MG capsule Take 20 mg by mouth daily.        No current facility-administered medications for this visit.    Allergies as of 10/06/2021   (No Known Allergies)    Family History  Problem Relation Age of Onset   Colon cancer Neg Hx     Social History   Socioeconomic History   Marital status: Single    Spouse name: Not on file   Number of children: 0   Years of education: Not on file   Highest education level: Not on file  Occupational History   Occupation: unemployed  Tobacco Use   Smoking status: Never   Smokeless tobacco: Current    Types: Snuff   Tobacco comments:    uses dip  Substance and Sexual Activity   Alcohol use: No    Comment: one beer a week   Drug use: No   Sexual activity: Not on file  Other Topics Concern   Not on file  Social History Narrative   Lives with sister.   Social Determinants of Health   Financial Resource Strain: Not on file  Food Insecurity: Not on file  Transportation Needs: Not on file  Physical Activity: Not on file  Stress: Not on file  Social Connections: Not on file  Intimate Partner Violence: Not on file    Review of Systems: Gen: Denies any fever, chills, fatigue, weight loss, lack of appetite.  CV: Denies chest pain, heart palpitations, peripheral edema, syncope.  Resp: Denies shortness of breath at rest or with exertion. Denies wheezing or cough.  GI: Denies dysphagia or odynophagia. Denies jaundice, hematemesis, fecal incontinence. GU : Denies urinary burning,  urinary frequency, urinary hesitancy MS: Denies joint pain, muscle weakness, cramps, or limitation of movement.  Derm: Denies rash, itching, dry skin Psych: Denies depression, anxiety, memory loss, and confusion Heme: Denies bruising, bleeding, and enlarged lymph nodes.  Physical Exam: There were no vitals taken for this visit. General:   Alert and oriented. Pleasant and cooperative. Well-nourished and well-developed.  Head:  Normocephalic and atraumatic. Eyes:  Without icterus, sclera clear and  conjunctiva pink.  Ears:  Normal auditory acuity. Nose:  No deformity, discharge,  or lesions. Mouth:  No deformity or lesions, oral mucosa pink.  Neck:  Supple, without mass or thyromegaly. Lungs:  Clear to auscultation bilaterally. No wheezes, rales, or rhonchi. No distress.  Heart:  S1, S2 present without murmurs appreciated.  Abdomen:  +BS, soft, non-tender and non-distended. No HSM noted. No guarding or rebound. No masses appreciated.  Rectal:  Deferred  Msk:  Symmetrical without gross deformities. Normal posture. Pulses:  Normal pulses noted. Extremities:  Without clubbing or edema. Neurologic:  Alert and  oriented x4;  grossly normal neurologically. Skin:  Intact without significant lesions or rashes. Cervical Nodes:  No significant cervical adenopathy. Psych:  Alert and cooperative. Normal mood and affect.

## 2021-10-06 ENCOUNTER — Encounter: Payer: Self-pay | Admitting: Internal Medicine

## 2021-10-06 ENCOUNTER — Encounter: Payer: Self-pay | Admitting: Gastroenterology

## 2021-10-06 ENCOUNTER — Ambulatory Visit: Payer: PPO | Admitting: Gastroenterology

## 2021-10-07 ENCOUNTER — Encounter: Payer: Self-pay | Admitting: Internal Medicine

## 2021-11-27 NOTE — Progress Notes (Signed)
Referring Provider: Dr. Pleas Koch Primary Care Physician:  Curlene Labrum, MD Primary Gastroenterologist:  Dr. Gala Romney  Chief Complaint  Patient presents with   Gastroesophageal Reflux    Occ vomiting if eats too fast or tries to swallow too much   Colonoscopy    No problems    HPI:   Henry [name] is a 70 y.o. male presenting today at the request of Dr. Pleas Koch for consult colonoscopy and heartburn.   Patient has not been seen in our office since 2012.  He was scheduled for EGD and colonoscopy at that time due to postprandial vomiting and postprandial bowel movements.  Procedures were completed in June 2012.  EGD revealed submucosal ring at GE junction s/p dilation, hiatal hernia, question scalloping at D2 and D3 s/p biopsy.  Pathology revealed chronic duodenitis consistent with peptic duodenitis.  Colonoscopy with left-sided diverticula, otherwise normal exam. Recommended continuing omeprazole 20 mg daily, probiotic daily, and repeat colonoscopy in 10 years.  He was admitted in December 2020 at [facility] with left facial paralysis, stroke ruled out and diagnosed with Bell's Palsy. Also noted dysphagia. Completed modified barium swallow with significant residual barium within a mildly dilated thoracic esophagus which could represent a distal stricture, transient penetration without aspiration. Follow-up EGD with moderate dilation of upper and middle third of the esophagus s/p biopsied, no dilatable structure seen, normal examined stomach and duodenum.  Recommended considering esophageal manometry versus formal barium swallow with tablet to evaluate for achalasia morphology versus nonspecific esophageal dysmotility. I do not see any documentation of follow-up.   Today:  Overall presents with his sister today who helps provide history.  Patient is doing well overall.  Bowels moving well, 1-2 times per day.  Stools are soft and formed without BRBPR or melena.  Denies  abdominal pain or unintentional weight loss.  Typical reflux and Tums are well controlled on omeprazole 20 mg daily.  Continues with postprandial vomiting if eating or drinking too quickly.  He can feel foods/liquids sitting in his esophagus which is followed by vomiting.  As long as his sister is watching him and he eats slowly or drink slowly, he has no problems with this.  This is a chronic issue that has been going on for 12+ years without any significant change.  Does not eat within 3 hours of going to bed.  No nocturnal regurgitation/vomiting.  Denies hematemesis.  Rare use of Aleve.  Sister does not remember patient having any significant improvement in his dysphagia symptoms after dilation in 2012.  Plans to go to [place] in March for a couple of months.   Past Medical History:  Diagnosis Date   Diabetes ([place])    GERD (gastroesophageal reflux disease)    Mental disability     Past Surgical History:  Procedure Laterality Date   ANKLE FRACTURE SURGERY     pin   cervical fracture     wore halo brace   COLONOSCOPY  05/18/2011   Rourk; Left sided diverticula, otherwise normal exam.  Repeat in 10 years.   ESOPHAGOGASTRODUODENOSCOPY  10/30/2005   distal esophageal narrowing s/p dilation up to 51mm. ?achalasia?   ESOPHAGOGASTRODUODENOSCOPY  05/18/2011   Rourk; Submucosal ring at GE junction s/p dilation, hiatal hernia, normal examined stomach, questionable scalloping at D2 and D3 s/p biopsy.  Pathology with chronic duodenitis consistent with peptic duodenitis.   ESOPHAGOGASTRODUODENOSCOPY  10/2019   Dr. Staci Righter; moderate dilation of upper third of the esophagus and middle third of  the esophagus s/p biopsied, otherwise normal exam.  Recommended considering manometry to rule out achalasia versus BPE. No dilation performed.   ESOPHAGOGASTRODUODENOSCOPY  11/28/2019   Dr. Jill Alexanders; Moderate dilation of upper and middle third of the esophagus s/p biopsied, no dilatable stricture seen,  normal examined stomach and duodenum. Pathology was benign.   TONSILLECTOMY      Current Outpatient Medications  Medication Sig Dispense Refill   diphenhydramine-acetaminophen (TYLENOL PM) 25-500 MG TABS Take 1 tablet by mouth at bedtime as needed.     metFORMIN (GLUCOPHAGE) 500 MG tablet Take 500 mg by mouth 2 (two) times daily.     omeprazole (PRILOSEC) 20 MG capsule Take 20 mg by mouth daily.       No current facility-administered medications for this visit.    Allergies as of 11/28/2021   (No Known Allergies)    Family History  Problem Relation Age of Onset   Colon cancer Neg Hx     Social History   Socioeconomic History   Marital status: Single    Spouse name: Not on file   Number of children: 0   Years of education: Not on file   Highest education level: Not on file  Occupational History   Occupation: unemployed  Tobacco Use   Smoking status: Never   Smokeless tobacco: Former    Types: Snuff   Tobacco comments:    uses dip  Substance and Sexual Activity   Alcohol use: Yes    Comment: rarely   Drug use: No   Sexual activity: Not on file  Other Topics Concern   Not on file  Social History Narrative   Lives with sister.   Social Determinants of Health   Financial Resource Strain: Not on file  Food Insecurity: Not on file  Transportation Needs: Not on file  Physical Activity: Not on file  Stress: Not on file  Social Connections: Not on file  Intimate Partner Violence: Not on file    Review of Systems: Gen: Denies any fever, chills, cold or flulike symptoms, presyncope, syncope. CV: Denies chest pain, heart palpitations. Resp: Denies shortness of breath or cough. GI: See HPI GU : Denies urinary burning, urinary frequency, urinary hesitancy MS: Denies joint pain Derm: Denies rash Psych: Denies depression, anxiety Heme: See HPI  Physical Exam: BP 132/73    Pulse 72    Temp (!) 97.1 F (36.2 C) (Temporal)    Ht 5\' 11"  (1.803 m)    Wt 198 lb 9.6 oz  (90.1 kg)    BMI 27.70 kg/m  General:   Alert and oriented. Pleasant and cooperative. Well-nourished and well-developed.  Head:  Normocephalic and atraumatic. Eyes:  Without icterus, sclera clear and conjunctiva pink.  Ears:  Normal auditory acuity. Lungs:  Clear to auscultation bilaterally. No wheezes, rales, or rhonchi. No distress.  Heart:  S1, S2 present without murmurs appreciated.  Abdomen:  +BS, soft, non-tender and non-distended. No HSM noted. No guarding or rebound. No masses appreciated.  Rectal:  Deferred  Msk:  Symmetrical without gross deformities. Normal posture. Extremities:  Without edema. Neurologic:  Alert and  oriented x4;  grossly normal neurologically. Skin:  Intact without significant lesions or rashes. Psych:  Normal mood and affect.    Assessment: 70 year old male with history of diabetes, mental disability, GERD, esophageal dysphagia, presenting today to discuss scheduling screening colonoscopy.  Also discussed history of reflux and esophageal dysphagia.  Colon cancer screening:  Due for screening colonoscopy.  Last colonoscopy in June 2012 with  left-sided diverticula, otherwise normal exam.  No significant lower GI symptoms.  No alarm symptoms.  No family history of colon cancer.  GERD:  Typical reflux symptoms are well controlled on omeprazole 20 mg daily.  Ongoing esophageal dysphagia discussed below.  Esophageal dysphagia:  Chronic.  Sister reports 12+ years of postprandial vomiting if patient eats or drinks too quickly.  He feels food/liquids sitting in his esophagus during these times which is followed by vomiting.  As long as he eats slowly and drink slowly, he has no trouble.  EGD in 2012 with submucosal ring at GE junction s/p dilation, hiatal hernia.  Patient sister does not remember any significant improvement following dilation in 2012.  Modified barium swallow December 2020 while hospitalized following an episode of Bell's palsy at [facility] revealed significant residual barium within a mildly dilated thoracic esophagus.  Follow-up EGD the following day with moderate dilation of upper and middle third of the esophagus s/p biopsied, no dilatable stricture seen, normal examined stomach and duodenum. Pathology was benign.   Suspect patient may very well have achalasia vs other underlying motility disorder. Can't rule out development of an underlying esophageal structure/ring. Less likely malignancy. Discussed case with Dr. Gala Romney.  As it has been 2 years since he has had any formal evaluation of his esophagus, recommend barium pill esophagram first.  Pending results, may refer for manometry.   Plan:  Proceed with colonoscopy with propofol with Dr. Gala Romney in the near future. The risks, benefits, and alternatives have been discussed with the patient in detail. The patient states understanding and desires to proceed. ASA 2 Propofol as last EGD was completed with propofol at outside hospital. He will take one half dose of metformin day prior to his procedure and hold morning metformin dose the day of his procedure. Arrange barium pill esophagram. Continue omeprazole 20 mg daily. Recommended small frequent meals, drinking small quantities of fluids at a time, eat slowly, take small bites, chew thoroughly.  Sit upright 90 degrees while eating and remain upright for 2-3 hours after eating. Further recommendations to follow barium pill esophagram.   Aliene Altes, PA-C [facility] 11/28/2021

## 2021-11-28 ENCOUNTER — Encounter: Payer: Self-pay | Admitting: Gastroenterology

## 2021-11-28 ENCOUNTER — Ambulatory Visit: Payer: PPO | Admitting: Gastroenterology

## 2021-11-28 ENCOUNTER — Other Ambulatory Visit: Payer: Self-pay

## 2021-11-28 VITALS — BP 132/73 | HR 72 | Temp 97.1°F | Ht 71.0 in | Wt 198.6 lb

## 2021-11-28 DIAGNOSIS — Z1211 Encounter for screening for malignant neoplasm of colon: Secondary | ICD-10-CM | POA: Diagnosis not present

## 2021-11-28 DIAGNOSIS — R1319 Other dysphagia: Secondary | ICD-10-CM | POA: Diagnosis not present

## 2021-11-28 DIAGNOSIS — K219 Gastro-esophageal reflux disease without esophagitis: Secondary | ICD-10-CM | POA: Diagnosis not present

## 2021-11-28 NOTE — Patient Instructions (Addendum)
We will arrange for you to have a colonoscopy in the near future with Dr. Gala Romney.   We will also arrange for you to have a swallowing study at [facility] to further evaluate your swallowing trouble.  Continue taking Prilosec 20 mg daily 30 minutes before breakfast.  Continue eating slowly, taking small bites, chewing thoroughly.   Try eating small frequent meals and drinking small amounts of fluids at a time to allow them to pass through your esophagus.  Sit upright 90 degrees while you are eating and remain upright for couple of hours after eating.  Do not eat within 3 hours of laying down.  Further recommendations including time to follow-up pending your swallowing study.  It was a pleasure seeing you today!  I hope you have a very happy new year!  Enjoy your upcoming trip to [place]!  Aliene Altes, PA-C [facility]

## 2021-12-05 ENCOUNTER — Ambulatory Visit (HOSPITAL_COMMUNITY): Payer: PPO

## 2021-12-11 ENCOUNTER — Other Ambulatory Visit: Payer: Self-pay

## 2021-12-11 ENCOUNTER — Ambulatory Visit (HOSPITAL_COMMUNITY)
Admission: RE | Admit: 2021-12-11 | Discharge: 2021-12-11 | Disposition: A | Discharge status: Home / Self Care | Payer: Medicare Other | Source: Ambulatory Visit | Attending: Gastroenterology | Admitting: Gastroenterology

## 2021-12-11 DIAGNOSIS — R1319 Other dysphagia: Secondary | ICD-10-CM | POA: Insufficient documentation

## 2021-12-11 DIAGNOSIS — R1310 Dysphagia, unspecified: Secondary | ICD-10-CM | POA: Diagnosis not present

## 2021-12-11 DIAGNOSIS — R6339 Other feeding difficulties: Secondary | ICD-10-CM | POA: Diagnosis not present

## 2021-12-12 ENCOUNTER — Telehealth: Payer: Self-pay | Admitting: *Deleted

## 2021-12-12 NOTE — Telephone Encounter (Signed)
Called sister Joanne Chars at [phone] and LMOVM to call back to schedule TCS with propofol, Dr. Gala Romney, ASA 2

## 2021-12-15 ENCOUNTER — Other Ambulatory Visit: Payer: Self-pay

## 2021-12-15 MED ORDER — CLENPIQ 10-3.5-12 MG-GM -GM/160ML PO SOLN: 1.0000 | Freq: Once | ORAL | 0 refills | Status: AC

## 2021-12-30 ENCOUNTER — Encounter (HOSPITAL_COMMUNITY)
Admission: RE | Disposition: A | Discharge status: Home / Self Care | Payer: Self-pay | Source: Home / Self Care | Attending: Internal Medicine

## 2021-12-30 ENCOUNTER — Other Ambulatory Visit: Payer: Self-pay

## 2021-12-30 ENCOUNTER — Ambulatory Visit (HOSPITAL_COMMUNITY): Payer: Medicare Other | Admitting: Anesthesiology

## 2021-12-30 ENCOUNTER — Ambulatory Visit (HOSPITAL_COMMUNITY)
Admission: RE | Admit: 2021-12-30 | Discharge: 2021-12-30 | Disposition: A | Discharge status: Home / Self Care | Payer: Medicare Other | Attending: Internal Medicine | Admitting: Internal Medicine

## 2021-12-30 ENCOUNTER — Encounter (HOSPITAL_COMMUNITY): Payer: Self-pay

## 2021-12-30 DIAGNOSIS — R933 Abnormal findings on diagnostic imaging of other parts of digestive tract: Secondary | ICD-10-CM | POA: Diagnosis not present

## 2021-12-30 DIAGNOSIS — K635 Polyp of colon: Secondary | ICD-10-CM

## 2021-12-30 DIAGNOSIS — D123 Benign neoplasm of transverse colon: Secondary | ICD-10-CM | POA: Insufficient documentation

## 2021-12-30 DIAGNOSIS — E119 Type 2 diabetes mellitus without complications: Secondary | ICD-10-CM | POA: Diagnosis not present

## 2021-12-30 DIAGNOSIS — K648 Other hemorrhoids: Secondary | ICD-10-CM | POA: Insufficient documentation

## 2021-12-30 DIAGNOSIS — F419 Anxiety disorder, unspecified: Secondary | ICD-10-CM | POA: Diagnosis not present

## 2021-12-30 DIAGNOSIS — R1310 Dysphagia, unspecified: Secondary | ICD-10-CM | POA: Diagnosis not present

## 2021-12-30 DIAGNOSIS — Z7984 Long term (current) use of oral hypoglycemic drugs: Secondary | ICD-10-CM | POA: Insufficient documentation

## 2021-12-30 DIAGNOSIS — Z87891 Personal history of nicotine dependence: Secondary | ICD-10-CM | POA: Insufficient documentation

## 2021-12-30 DIAGNOSIS — K219 Gastro-esophageal reflux disease without esophagitis: Secondary | ICD-10-CM | POA: Insufficient documentation

## 2021-12-30 DIAGNOSIS — Z1211 Encounter for screening for malignant neoplasm of colon: Secondary | ICD-10-CM | POA: Insufficient documentation

## 2021-12-30 DIAGNOSIS — K2289 Other specified disease of esophagus: Secondary | ICD-10-CM | POA: Insufficient documentation

## 2021-12-30 HISTORY — PX: BALLOON DILATION: SHX5330

## 2021-12-30 HISTORY — PX: POLYPECTOMY: SHX5525

## 2021-12-30 HISTORY — PX: COLONOSCOPY WITH PROPOFOL: SHX5780

## 2021-12-30 HISTORY — PX: ESOPHAGOGASTRODUODENOSCOPY (EGD) WITH PROPOFOL: SHX5813

## 2021-12-30 LAB — GLUCOSE, CAPILLARY: Glucose-Capillary: 115 mg/dL — ABNORMAL HIGH (ref 70–99)

## 2021-12-30 SURGERY — COLONOSCOPY WITH PROPOFOL
Anesthesia: General

## 2021-12-30 MED ORDER — LIDOCAINE HCL (CARDIAC) PF 100 MG/5ML IV SOSY
PREFILLED_SYRINGE | INTRAVENOUS | Status: DC | PRN
  Administered 2021-12-30: 50 mg via INTRAVENOUS

## 2021-12-30 MED ORDER — PROPOFOL 500 MG/50ML IV EMUL
INTRAVENOUS | Status: DC | PRN
  Administered 2021-12-30: 100 ug/kg/min via INTRAVENOUS

## 2021-12-30 MED ORDER — PROPOFOL 10 MG/ML IV BOLUS
INTRAVENOUS | Status: DC | PRN
  Administered 2021-12-30: 20 mg via INTRAVENOUS
  Administered 2021-12-30: 70 mg via INTRAVENOUS

## 2021-12-30 MED ORDER — LACTATED RINGERS IV SOLN: INTRAVENOUS | Status: DC

## 2021-12-30 NOTE — Anesthesia Preprocedure Evaluation (Signed)
Anesthesia Evaluation  Patient identified by MRN, date of birth, ID band Patient awake    Reviewed: Allergy & Precautions, NPO status , Patient's Chart, lab work & pertinent test results  Airway Mallampati: II  TM Distance: >3 FB Neck ROM: Full   Comment: Cervical fx Dental  (+) Edentulous Upper, Edentulous Lower   Pulmonary neg pulmonary ROS,    Pulmonary exam normal breath sounds clear to auscultation       Cardiovascular negative cardio ROS Normal cardiovascular exam Rhythm:Regular Rate:Normal     Neuro/Psych PSYCHIATRIC DISORDERS Anxiety negative neurological ROS     GI/Hepatic Neg liver ROS, GERD  Medicated,  Endo/Other  diabetes, Well Controlled, Type 2, Oral Hypoglycemic Agents  Renal/GU negative Renal ROS  negative genitourinary   Musculoskeletal negative musculoskeletal ROS (+)   Abdominal   Peds negative pediatric ROS (+)  Hematology negative hematology ROS (+)   Anesthesia Other Findings Hard of hearing  Reproductive/Obstetrics negative OB ROS                             Anesthesia Physical Anesthesia Plan  ASA: 2  Anesthesia Plan: General   Post-op Pain Management: Minimal or no pain anticipated   Induction: Intravenous  PONV Risk Score and Plan: TIVA  Airway Management Planned: Nasal Cannula and Natural Airway  Additional Equipment:   Intra-op Plan:   Post-operative Plan:   Informed Consent: I have reviewed the patients History and Physical, chart, labs and discussed the procedure including the risks, benefits and alternatives for the proposed anesthesia with the patient or authorized representative who has indicated his/her understanding and acceptance.     Dental advisory given  Plan Discussed with: CRNA and Surgeon  Anesthesia Plan Comments:         Anesthesia Quick Evaluation

## 2021-12-30 NOTE — Discharge Instructions (Addendum)
EGD Discharge instructions Please read the instructions outlined below and refer to this sheet in the next few weeks. These discharge instructions provide you with general information on caring for yourself after you leave the hospital. Your doctor may also give you specific instructions. While your treatment has been planned according to the most current medical practices available, unavoidable complications occasionally occur. If you have any problems or questions after discharge, please call your doctor. ACTIVITY You may resume your regular activity but move at a slower pace for the next 24 hours.  Take frequent rest periods for the next 24 hours.  Walking will help expel (get rid of) the air and reduce the bloated feeling in your abdomen.  No driving for 24 hours (because of the anesthesia (medicine) used during the test).  You may shower.  Do not sign any important legal documents or operate any machinery for 24 hours (because of the anesthesia used during the test).  NUTRITION Drink plenty of fluids.  You may resume your normal diet.  Begin with a light meal and progress to your normal diet.  Avoid alcoholic beverages for 24 hours or as instructed by your caregiver.  MEDICATIONS You may resume your normal medications unless your caregiver tells you otherwise.  WHAT YOU CAN EXPECT TODAY You may experience abdominal discomfort such as a feeling of fullness or gas pains.  FOLLOW-UP Your doctor will discuss the results of your test with you.  SEEK IMMEDIATE MEDICAL ATTENTION IF ANY OF THE FOLLOWING OCCUR: Excessive nausea (feeling sick to your stomach) and/or vomiting.  Severe abdominal pain and distention (swelling).  Trouble swallowing.  Temperature over 101 F (37.8 C).  Rectal bleeding or vomiting of blood.    Colonoscopy Discharge Instructions  Read the instructions outlined below and refer to this sheet in the next few weeks. These discharge instructions provide you with  general information on caring for yourself after you leave the hospital. Your doctor may also give you specific instructions. While your treatment has been planned according to the most current medical practices available, unavoidable complications occasionally occur.   ACTIVITY You may resume your regular activity, but move at a slower pace for the next 24 hours.  Take frequent rest periods for the next 24 hours.  Walking will help get rid of the air and reduce the bloated feeling in your belly (abdomen).  No driving for 24 hours (because of the medicine (anesthesia) used during the test).   Do not sign any important legal documents or operate any machinery for 24 hours (because of the anesthesia used during the test).  NUTRITION Drink plenty of fluids.  You may resume your normal diet as instructed by your doctor.  Begin with a light meal and progress to your normal diet. Heavy or fried foods are harder to digest and may make you feel sick to your stomach (nauseated).  Avoid alcoholic beverages for 24 hours or as instructed.  MEDICATIONS You may resume your normal medications unless your doctor tells you otherwise.  WHAT YOU CAN EXPECT TODAY Some feelings of bloating in the abdomen.  Passage of more gas than usual.  Spotting of blood in your stool or on the toilet paper.  IF YOU HAD POLYPS REMOVED DURING THE COLONOSCOPY: No aspirin products for 7 days or as instructed.  No alcohol for 7 days or as instructed.  Eat a soft diet for the next 24 hours.  FINDING OUT THE RESULTS OF YOUR TEST Not all test results are available  during your visit. If your test results are not back during the visit, make an appointment with your caregiver to find out the results. Do not assume everything is normal if you have not heard from your caregiver or the medical facility. It is important for you to follow up on all of your test results.  SEEK IMMEDIATE MEDICAL ATTENTION IF: You have more than a spotting of  blood in your stool.  Your belly is swollen (abdominal distention).  You are nauseated or vomiting.  You have a temperature over 101.  You have abdominal pain or discomfort that is severe or gets worse throughout the day.    Your EGD showed findings consistent with a condition called achalasia which can lead to chronic difficulties with swallowing.  I did dilate your esophagus.  Hopefully this helps.  I think the next step we need to have done is a test called esophageal manometry to confirm this condition.  We can then discuss about more aggressive treatment options.  Your colonoscopy revealed 3 polyp(s) which I removed successfully. Await pathology results, my office will contact you. I recommend repeating colonoscopy in 5 years for surveillance purposes.    I hope you have a great rest of your week!  Elon Alas. Abbey Chatters, D.O. Gastroenterology and Hepatology [facility]

## 2021-12-30 NOTE — Anesthesia Postprocedure Evaluation (Signed)
Anesthesia Post Note  Patient: Henry [name]  Procedure(s) Performed: COLONOSCOPY WITH PROPOFOL ESOPHAGOGASTRODUODENOSCOPY (EGD) WITH PROPOFOL BALLOON DILATION POLYPECTOMY  Patient location during evaluation: Phase II Anesthesia Type: General Level of consciousness: awake and alert and oriented Pain management: pain level controlled Vital Signs Assessment: post-procedure vital signs reviewed and stable Respiratory status: spontaneous breathing, nonlabored ventilation and respiratory function stable Cardiovascular status: blood pressure returned to baseline and stable Postop Assessment: no apparent nausea or vomiting Anesthetic complications: no   No notable events documented.   Last Vitals:  Vitals:   12/30/21 0932 12/30/21 1050  BP: (!) 154/92 (!) 104/57  Pulse:  60  Resp: 18 15  Temp: 37.1 C 36.9 C  SpO2: 97% 97%    Last Pain:  Vitals:   12/30/21 1050  TempSrc: Oral  PainSc: 0-No pain                 Hutton Pellicane C Ranier Coach

## 2021-12-30 NOTE — H&P (Addendum)
Primary Care Physician:  Curlene Labrum, MD Primary Gastroenterologist:  Dr. Abbey Chatters  Pre-Procedure History & Physical: HPI:  Henry [name] is a 71 y.o. male is here for an EGD for dysphagia and abnormal esophagram  and a colonoscopy for colon cancer screening purposes.  Patient denies any family history of colorectal cancer.  No melena or hematochezia.  No abdominal pain or unintentional weight loss.  No change in bowel habits.  Overall feels well from a GI standpoint.  Past Medical History:  Diagnosis Date   Diabetes ([place])    GERD (gastroesophageal reflux disease)    Mental disability     Past Surgical History:  Procedure Laterality Date   ANKLE FRACTURE SURGERY     pin   cervical fracture     wore halo brace   COLONOSCOPY  05/18/2011   Rourk; Left sided diverticula, otherwise normal exam.  Repeat in 10 years.   ESOPHAGOGASTRODUODENOSCOPY  10/30/2005   distal esophageal narrowing s/p dilation up to 15mm. ?achalasia?   ESOPHAGOGASTRODUODENOSCOPY  05/18/2011   Rourk; Submucosal ring at GE junction s/p dilation, hiatal hernia, normal examined stomach, questionable scalloping at D2 and D3 s/p biopsy.  Pathology with chronic duodenitis consistent with peptic duodenitis.   ESOPHAGOGASTRODUODENOSCOPY  10/2019   Dr. Staci Righter; moderate dilation of upper third of the esophagus and middle third of the esophagus s/p biopsied, otherwise normal exam.  Recommended considering manometry to rule out achalasia versus BPE. No dilation performed.   ESOPHAGOGASTRODUODENOSCOPY  11/28/2019   Dr. Jill Alexanders; Moderate dilation of upper and middle third of the esophagus s/p biopsied, no dilatable stricture seen, normal examined stomach and duodenum. Pathology was benign.   TONSILLECTOMY      Prior to Admission medications   Medication Sig Start Date End Date Taking? Authorizing Provider  diphenhydramine-acetaminophen (TYLENOL PM) 25-500 MG TABS Take 1 tablet by mouth at bedtime as needed (pain/sleep).    Yes [provider]  metFORMIN (GLUCOPHAGE) 500 MG tablet Take 500 mg by mouth 2 (two) times daily. 11/01/21  Yes [provider]  Multiple Vitamins-Minerals (MULTIVITAMIN WITH MINERALS) tablet Take 1 tablet by mouth daily.   Yes [provider]  omeprazole (PRILOSEC) 20 MG capsule Take 20 mg by mouth daily.     Yes [provider]    Allergies as of 12/15/2021   (No Known Allergies)    Family History  Problem Relation Age of Onset   Colon cancer Neg Hx     Social History   Socioeconomic History   Marital status: Single    Spouse name: Not on file   Number of children: 0   Years of education: Not on file   Highest education level: Not on file  Occupational History   Occupation: unemployed  Tobacco Use   Smoking status: Never   Smokeless tobacco: Former    Types: Snuff   Tobacco comments:    uses dip  Substance and Sexual Activity   Alcohol use: Yes    Comment: rarely   Drug use: No   Sexual activity: Not on file  Other Topics Concern   Not on file  Social History Narrative   Lives with sister.   Social Determinants of Health   Financial Resource Strain: Not on file  Food Insecurity: Not on file  Transportation Needs: Not on file  Physical Activity: Not on file  Stress: Not on file  Social Connections: Not on file  Intimate Partner Violence: Not on file    Review of  Systems: See HPI, otherwise negative ROS  Physical Exam: Vital signs in last 24 hours: Temp:  [98.7 F (37.1 C)] 98.7 F (37.1 C) (01/31 0932) Resp:  [18] 18 (01/31 0932) BP: (154)/(92) 154/92 (01/31 0932) SpO2:  [97 %] 97 % (01/31 0932)   General:   Alert,  Well-developed, well-nourished, pleasant and cooperative in NAD Head:  Normocephalic and atraumatic. Eyes:  Sclera clear, no icterus.   Conjunctiva pink. Ears:  Normal auditory acuity. Nose:  No deformity, discharge,  or lesions. Mouth:  No deformity or lesions, dentition normal. Neck:  Supple;  no masses or thyromegaly. Lungs:  Clear throughout to auscultation.   No wheezes, crackles, or rhonchi. No acute distress. Heart:  Regular rate and rhythm; no murmurs, clicks, rubs,  or gallops. Abdomen:  Soft, nontender and nondistended. No masses, hepatosplenomegaly or hernias noted. Normal bowel sounds, without guarding, and without rebound.   Msk:  Symmetrical without gross deformities. Normal posture. Extremities:  Without clubbing or edema. Neurologic:  Alert and  oriented x4;  grossly normal neurologically. Skin:  Intact without significant lesions or rashes. Cervical Nodes:  No significant cervical adenopathy. Psych:  Alert and cooperative. Normal [name] and affect.  Impression/Plan: Henry [name] is here for an EGD for dysphagia and abnormal esophagram and a colonoscopy to be performed for colon cancer screening purposes.  The risks of the procedure including infection, bleed, or perforation as well as benefits, limitations, alternatives and imponderables have been reviewed with the patient. Questions have been answered. All parties agreeable.

## 2021-12-30 NOTE — Transfer of Care (Signed)
Immediate Anesthesia Transfer of Care Note  Patient: Henry [name]  Procedure(s) Performed: COLONOSCOPY WITH PROPOFOL ESOPHAGOGASTRODUODENOSCOPY (EGD) WITH PROPOFOL BALLOON DILATION POLYPECTOMY  Patient Location: Endoscopy Unit  Anesthesia Type:General  Level of Consciousness: drowsy  Airway & Oxygen Therapy: Patient Spontanous Breathing  Post-op Assessment: Report given to RN and Post -op Vital signs reviewed and stable  Post vital signs: Reviewed and stable  Last Vitals:  Vitals Value Taken Time  BP    Temp    Pulse 60   Resp 17   SpO2 98%     Last Pain:  Vitals:   12/30/21 1020  TempSrc:   PainSc: 0-No pain      Patients Stated Pain Goal: 8 (41/99/14 [id number])  Complications: No notable events documented.

## 2021-12-30 NOTE — Op Note (Signed)
[facility] Patient Name: Henry [name] Procedure Date: 12/30/2021 10:10 AM MRN: [id number] Date of Birth: 24-Mar-1951 Attending MD: Elon Alas. Abbey Chatters DO CSN: [id number] Age: 71 Admit Type: Outpatient Procedure:                Colonoscopy Indications:              Screening for colorectal malignant neoplasm Providers:                Elon Alas. Abbey Chatters, DO, Caprice Kluver, Thomas Hoff., Technician, Aram Candela Referring MD:              Medicines:                See the Anesthesia note for documentation of the                            administered medications Complications:            No immediate complications. Estimated Blood Loss:     Estimated blood loss was minimal. Procedure:                Pre-Anesthesia Assessment:                           - The anesthesia plan was to use monitored                            anesthesia care (MAC).                           After obtaining informed consent, the colonoscope                            was passed under direct vision. Throughout the                            procedure, the patient's blood pressure, pulse, and                            oxygen saturations were monitored continuously. The                            [id number]-HQ190L ([id number]) scope was introduced through                            the anus and advanced to the the cecum, identified                            by appendiceal orifice and ileocecal valve. The                            colonoscopy was performed without difficulty. The                            patient tolerated the procedure  well. The quality                            of the bowel preparation was evaluated using the                            BBPS [facility] Bowel Preparation Scale) with scores                            of: Right Colon = 2 (minor amount of residual                            staining, small fragments of stool and/or opaque                            liquid,  but mucosa seen well), Transverse Colon = 3                            (entire mucosa seen well with no residual staining,                            small fragments of stool or opaque liquid) and Left                            Colon = 3 (entire mucosa seen well with no residual                            staining, small fragments of stool or opaque                            liquid). The total BBPS score equals 8. The quality                            of the bowel preparation was good. Scope In: 10:35:01 AM Scope Out: 10:46:04 AM Scope Withdrawal Time: 0 hours 9 minutes 2 seconds  Total Procedure Duration: 0 hours 11 minutes 3 seconds  Findings:      The perianal and digital rectal examinations were normal.      Non-bleeding internal hemorrhoids were found during endoscopy.      Three sessile polyps were found in the transverse colon. The polyps were       4 to 5 mm in size. These polyps were removed with a cold snare.       Resection and retrieval were complete.      The exam was otherwise without abnormality. Impression:               - Non-bleeding internal hemorrhoids.                           - Three 4 to 5 mm polyps in the transverse colon,                            removed with a cold snare. Resected and retrieved.                           -  The examination was otherwise normal. Moderate Sedation:      Per Anesthesia Care Recommendation:           - Patient has a contact number available for                            emergencies. The signs and symptoms of potential                            delayed complications were discussed with the                            patient. Return to normal activities tomorrow.                            Written discharge instructions were provided to the                            patient.                           - Resume previous diet.                           - Continue present medications.                           - Await pathology  results.                           - Repeat colonoscopy in 5 years for surveillance. Procedure Code(s):        --- Professional ---                           [phone], Colonoscopy, flexible; with removal of                            tumor(s), polyp(s), or other lesion(s) by snare                            technique Diagnosis Code(s):        --- Professional ---                           Z12.11, Encounter for screening for malignant                            neoplasm of colon                           K63.5, Polyp of colon                           K64.8, Other hemorrhoids CPT copyright 2019 American [employer]. All rights reserved. The codes documented in this report are preliminary and upon coder review may  be revised to meet current compliance requirements. Elon Alas. Abbey Chatters, DO [place], DO 12/30/2021 10:53:52  AM This report has been signed electronically. Number of Addenda: 0

## 2021-12-30 NOTE — Op Note (Signed)
[facility] Patient Name: Henry [name] Procedure Date: 12/30/2021 10:13 AM MRN: [id number] Date of Birth: 1951-01-15 Attending MD: Elon Alas. Abbey Chatters DO CSN: [id number] Age: 71 Admit Type: Outpatient Procedure:                Upper GI endoscopy Indications:              Dysphagia, Abnormal esophagram Providers:                Elon Alas. Abbey Chatters, DO, Caprice Kluver, Raphael Gibney,                            Technician, Aram Candela Referring MD:              Medicines:                See the Anesthesia note for documentation of the                            administered medications Complications:            No immediate complications. Estimated Blood Loss:     Estimated blood loss was minimal. Procedure:                Pre-Anesthesia Assessment:                           - The anesthesia plan was to use monitored                            anesthesia care (MAC).                           After obtaining informed consent, the endoscope was                            passed under direct vision. Throughout the                            procedure, the patient's blood pressure, pulse, and                            oxygen saturations were monitored continuously. The                            [id number]-[id number] ([id number]) scope was introduced through the                            mouth, and advanced to the second part of duodenum.                            The upper GI endoscopy was accomplished without                            difficulty. The patient tolerated the procedure                            well. Scope In: 10:25:44  AM Scope Out: 10:31:53 AM Total Procedure Duration: 0 hours 6 minutes 9 seconds  Findings:      The lumen of the upper third of the esophagus and middle third of the       esophagus was dilated.      No appreciable esophageal motility was noted. In addition, a hypertonic       lower esophageal sphincter was found. There was moderate resistance to       endoscope  advancement into the stomach. The Z-line was regular. A TTS       dilator was passed through the scope. Dilation with an 18-19-20 mm       balloon dilator was performed to 18 mm. The dilation site was examined       and showed mild mucosal disruption and mild improvement in luminal       narrowing.      The entire examined stomach was normal.      The duodenal bulb, first portion of the duodenum and second portion of       the duodenum were normal. Impression:               - Dilation in the upper third of the esophagus and                            in the middle third of the esophagus.                           - The examination was suspicious for achalasia.                            Dilated.                           - Normal stomach.                           - Normal duodenal bulb, first portion of the                            duodenum and second portion of the duodenum.                           - No specimens collected. Moderate Sedation:      Per Anesthesia Care Recommendation:           - Patient has a contact number available for                            emergencies. The signs and symptoms of potential                            delayed complications were discussed with the                            patient. Return to normal activities tomorrow.                            Written discharge instructions were provided to the  patient.                           - Resume previous diet.                           - Continue present medications.                           - Findings suspicious for achalsia. Recommend                            esophageal manometry to confirm. Consider referral                            to foregut surgeon to discuss treatment options                            such as POEM vs less invasive options such as                            pneumatic dilation/botox injection. Procedure Code(s):        --- Professional ---                            [phone], Esophagogastroduodenoscopy, flexible,                            transoral; with transendoscopic balloon dilation of                            esophagus (less than 30 mm diameter) Diagnosis Code(s):        --- Professional ---                           K22.8, Other specified diseases of esophagus                           R13.10, Dysphagia, unspecified                           R93.3, Abnormal findings on diagnostic imaging of                            other parts of digestive tract CPT copyright 2019 American Medical Association. All rights reserved. The codes documented in this report are preliminary and upon coder review may  be revised to meet current compliance requirements. Elon Alas. Abbey Chatters, DO [place] Abbey Chatters, DO 12/30/2021 10:52:03 AM This report has been signed electronically. Number of Addenda: 0

## 2022-01-01 ENCOUNTER — Telehealth: Payer: Self-pay | Admitting: Internal Medicine

## 2022-01-01 DIAGNOSIS — R1319 Other dysphagia: Secondary | ICD-10-CM

## 2022-01-01 DIAGNOSIS — K219 Gastro-esophageal reflux disease without esophagitis: Secondary | ICD-10-CM

## 2022-01-01 LAB — SURGICAL PATHOLOGY

## 2022-01-01 NOTE — Addendum Note (Signed)
Addended by: Hassan Rowan on: 01/01/2022 03:02 PM   Modules accepted: Orders

## 2022-01-01 NOTE — Telephone Encounter (Signed)
This patient needs esophageal manometry to confirm diagnosis of achalasia. Thanks!

## 2022-01-01 NOTE — Telephone Encounter (Signed)
Referral sent to [facility] via Epic.

## 2022-01-01 NOTE — Telephone Encounter (Signed)
[facility] Clinical Pool: Please refer to [facility] for esophageal manometry per Dr. Abbey Chatters to confirm diagnosis of achalasia. Please let me know if there is any problems with getting him scheduled.

## 2022-01-02 ENCOUNTER — Encounter (HOSPITAL_COMMUNITY): Payer: Self-pay | Admitting: Internal Medicine

## 2022-01-27 DIAGNOSIS — R5382 Chronic fatigue, unspecified: Secondary | ICD-10-CM | POA: Diagnosis not present

## 2022-01-27 DIAGNOSIS — K219 Gastro-esophageal reflux disease without esophagitis: Secondary | ICD-10-CM | POA: Diagnosis not present

## 2022-01-27 DIAGNOSIS — E1165 Type 2 diabetes mellitus with hyperglycemia: Secondary | ICD-10-CM | POA: Diagnosis not present

## 2022-01-29 DIAGNOSIS — D126 Benign neoplasm of colon, unspecified: Secondary | ICD-10-CM | POA: Diagnosis not present

## 2022-01-29 DIAGNOSIS — H9190 Unspecified hearing loss, unspecified ear: Secondary | ICD-10-CM | POA: Diagnosis not present

## 2022-01-29 DIAGNOSIS — K219 Gastro-esophageal reflux disease without esophagitis: Secondary | ICD-10-CM | POA: Diagnosis not present

## 2022-01-29 DIAGNOSIS — E1165 Type 2 diabetes mellitus with hyperglycemia: Secondary | ICD-10-CM | POA: Diagnosis not present

## 2022-02-12 ENCOUNTER — Other Ambulatory Visit: Payer: Self-pay

## 2022-04-15 ENCOUNTER — Other Ambulatory Visit: Payer: Self-pay

## 2022-04-15 ENCOUNTER — Encounter (HOSPITAL_COMMUNITY): Payer: Self-pay | Admitting: *Deleted

## 2022-04-15 ENCOUNTER — Inpatient Hospital Stay (HOSPITAL_COMMUNITY)
Admission: EM | Admit: 2022-04-15 | Discharge: 2022-04-18 | DRG: 871 | Disposition: A | Discharge status: Home / Self Care | Payer: Medicare Other | Attending: Internal Medicine | Admitting: Internal Medicine

## 2022-04-15 ENCOUNTER — Emergency Department (HOSPITAL_COMMUNITY): Payer: Medicare Other

## 2022-04-15 DIAGNOSIS — Z79899 Other long term (current) drug therapy: Secondary | ICD-10-CM

## 2022-04-15 DIAGNOSIS — K219 Gastro-esophageal reflux disease without esophagitis: Secondary | ICD-10-CM | POA: Diagnosis present

## 2022-04-15 DIAGNOSIS — R918 Other nonspecific abnormal finding of lung field: Secondary | ICD-10-CM | POA: Diagnosis not present

## 2022-04-15 DIAGNOSIS — E1165 Type 2 diabetes mellitus with hyperglycemia: Secondary | ICD-10-CM | POA: Diagnosis present

## 2022-04-15 DIAGNOSIS — J690 Pneumonitis due to inhalation of food and vomit: Secondary | ICD-10-CM | POA: Diagnosis not present

## 2022-04-15 DIAGNOSIS — Z20822 Contact with and (suspected) exposure to covid-19: Secondary | ICD-10-CM | POA: Diagnosis not present

## 2022-04-15 DIAGNOSIS — E860 Dehydration: Secondary | ICD-10-CM | POA: Diagnosis not present

## 2022-04-15 DIAGNOSIS — E876 Hypokalemia: Secondary | ICD-10-CM | POA: Diagnosis not present

## 2022-04-15 DIAGNOSIS — E872 Acidosis, unspecified: Secondary | ICD-10-CM | POA: Diagnosis present

## 2022-04-15 DIAGNOSIS — A419 Sepsis, unspecified organism: Principal | ICD-10-CM | POA: Diagnosis present

## 2022-04-15 DIAGNOSIS — H9190 Unspecified hearing loss, unspecified ear: Secondary | ICD-10-CM | POA: Diagnosis present

## 2022-04-15 DIAGNOSIS — R509 Fever, unspecified: Secondary | ICD-10-CM

## 2022-04-15 DIAGNOSIS — Y92009 Unspecified place in unspecified non-institutional (private) residence as the place of occurrence of the external cause: Secondary | ICD-10-CM

## 2022-04-15 DIAGNOSIS — M47814 Spondylosis without myelopathy or radiculopathy, thoracic region: Secondary | ICD-10-CM | POA: Diagnosis not present

## 2022-04-15 DIAGNOSIS — Z7984 Long term (current) use of oral hypoglycemic drugs: Secondary | ICD-10-CM | POA: Diagnosis not present

## 2022-04-15 DIAGNOSIS — I517 Cardiomegaly: Secondary | ICD-10-CM | POA: Diagnosis not present

## 2022-04-15 DIAGNOSIS — Z87891 Personal history of nicotine dependence: Secondary | ICD-10-CM

## 2022-04-15 DIAGNOSIS — Z743 Need for continuous supervision: Secondary | ICD-10-CM | POA: Diagnosis not present

## 2022-04-15 DIAGNOSIS — K220 Achalasia of cardia: Secondary | ICD-10-CM | POA: Diagnosis not present

## 2022-04-15 DIAGNOSIS — W19XXXA Unspecified fall, initial encounter: Secondary | ICD-10-CM

## 2022-04-15 DIAGNOSIS — R0902 Hypoxemia: Secondary | ICD-10-CM | POA: Diagnosis not present

## 2022-04-15 DIAGNOSIS — R1310 Dysphagia, unspecified: Secondary | ICD-10-CM | POA: Diagnosis not present

## 2022-04-15 DIAGNOSIS — R1314 Dysphagia, pharyngoesophageal phase: Secondary | ICD-10-CM | POA: Diagnosis present

## 2022-04-15 DIAGNOSIS — Z9181 History of falling: Secondary | ICD-10-CM | POA: Diagnosis not present

## 2022-04-15 DIAGNOSIS — J9601 Acute respiratory failure with hypoxia: Secondary | ICD-10-CM | POA: Diagnosis present

## 2022-04-15 DIAGNOSIS — K222 Esophageal obstruction: Secondary | ICD-10-CM | POA: Diagnosis not present

## 2022-04-15 DIAGNOSIS — R6520 Severe sepsis without septic shock: Secondary | ICD-10-CM | POA: Diagnosis not present

## 2022-04-15 DIAGNOSIS — B9789 Other viral agents as the cause of diseases classified elsewhere: Secondary | ICD-10-CM | POA: Diagnosis present

## 2022-04-15 DIAGNOSIS — R000 Tachycardia, unspecified: Secondary | ICD-10-CM | POA: Diagnosis not present

## 2022-04-15 LAB — CBC WITH DIFFERENTIAL/PLATELET
Abs Immature Granulocytes: 0.93 10*3/uL — ABNORMAL HIGH (ref 0.00–0.07)
Basophils Absolute: 0.1 10*3/uL (ref 0.0–0.1)
Basophils Relative: 0 %
Eosinophils Absolute: 0 10*3/uL (ref 0.0–0.5)
Eosinophils Relative: 0 %
HCT: 42.1 % (ref 39.0–52.0)
Hemoglobin: 13.6 g/dL (ref 13.0–17.0)
Immature Granulocytes: 5 %
Lymphocytes Relative: 2 %
Lymphs Abs: 0.4 10*3/uL — ABNORMAL LOW (ref 0.7–4.0)
MCH: 29.3 pg (ref 26.0–34.0)
MCHC: 32.3 g/dL (ref 30.0–36.0)
MCV: 90.7 fL (ref 80.0–100.0)
Monocytes Absolute: 0.3 10*3/uL (ref 0.1–1.0)
Monocytes Relative: 1 %
Neutro Abs: 16.3 10*3/uL — ABNORMAL HIGH (ref 1.7–7.7)
Neutrophils Relative %: 92 %
Platelets: 200 10*3/uL (ref 150–400)
RBC: 4.64 MIL/uL (ref 4.22–5.81)
RDW: 14.5 % (ref 11.5–15.5)
WBC: 17.9 10*3/uL — ABNORMAL HIGH (ref 4.0–10.5)
nRBC: 0 % (ref 0.0–0.2)

## 2022-04-15 LAB — COMPREHENSIVE METABOLIC PANEL
ALT: 20 U/L (ref 0–44)
AST: 44 U/L — ABNORMAL HIGH (ref 15–41)
Albumin: 3.8 g/dL (ref 3.5–5.0)
Alkaline Phosphatase: 76 U/L (ref 38–126)
Anion gap: 14 (ref 5–15)
BUN: 26 mg/dL — ABNORMAL HIGH (ref 8–23)
CO2: 24 mmol/L (ref 22–32)
Calcium: 9.1 mg/dL (ref 8.9–10.3)
Chloride: 98 mmol/L (ref 98–111)
Creatinine, Ser: 0.85 mg/dL (ref 0.61–1.24)
GFR, Estimated: >60 mL/min (ref >60)
Glucose, Bld: 199 mg/dL — ABNORMAL HIGH (ref 70–99)
Potassium: 3.3 mmol/L — ABNORMAL LOW (ref 3.5–5.1)
Sodium: 136 mmol/L (ref 135–145)
Total Bilirubin: 1 mg/dL (ref 0.3–1.2)
Total Protein: 7.6 g/dL (ref 6.5–8.1)

## 2022-04-15 LAB — APTT: aPTT: 36 seconds (ref 24–36)

## 2022-04-15 LAB — URINALYSIS, ROUTINE W REFLEX MICROSCOPIC
Bilirubin Urine: NEGATIVE
Glucose, UA: NEGATIVE mg/dL
Ketones, ur: 5 mg/dL — AB
Leukocytes,Ua: NEGATIVE
Nitrite: NEGATIVE
Protein, ur: 30 mg/dL — AB
Specific Gravity, Urine: 1.018 (ref 1.005–1.030)
pH: 5 (ref 5.0–8.0)

## 2022-04-15 LAB — RESP PANEL BY RT-PCR (FLU A&B, COVID) ARPGX2
Influenza A by PCR: NEGATIVE
Influenza B by PCR: NEGATIVE
SARS Coronavirus 2 by RT PCR: NEGATIVE

## 2022-04-15 LAB — LACTIC ACID, PLASMA
Lactic Acid, Venous: 2.3 mmol/L (ref 0.5–1.9)
Lactic Acid, Venous: 2.7 mmol/L (ref 0.5–1.9)

## 2022-04-15 LAB — PROTIME-INR
INR: 1.2 (ref 0.8–1.2)
Prothrombin Time: 15 seconds (ref 11.4–15.2)

## 2022-04-15 MED ORDER — LORAZEPAM 2 MG/ML IJ SOLN
0.5000 mg | Freq: Once | INTRAMUSCULAR | Status: AC
  Administered 2022-04-15: 0.5 mg via INTRAVENOUS
  Filled 2022-04-15: qty 1

## 2022-04-15 MED ORDER — ACETAMINOPHEN 650 MG RE SUPP: 650.0000 mg | Freq: Four times a day (QID) | RECTAL | Status: DC | PRN

## 2022-04-15 MED ORDER — ONDANSETRON HCL 4 MG/2ML IJ SOLN: 4.0000 mg | Freq: Four times a day (QID) | INTRAMUSCULAR | Status: DC | PRN

## 2022-04-15 MED ORDER — ONDANSETRON HCL 4 MG PO TABS: 4.0000 mg | ORAL_TABLET | Freq: Four times a day (QID) | ORAL | Status: DC | PRN

## 2022-04-15 MED ORDER — ACETAMINOPHEN 325 MG PO TABS
650.0000 mg | ORAL_TABLET | Freq: Four times a day (QID) | ORAL | Status: DC | PRN
Start: 2022-04-15 — End: 2022-04-18
  Administered 2022-04-15: 650 mg via ORAL
  Filled 2022-04-15: qty 2

## 2022-04-15 MED ORDER — SODIUM CHLORIDE 0.9 % IV BOLUS (SEPSIS)
1000.0000 mL | Freq: Once | INTRAVENOUS | Status: AC
  Administered 2022-04-15: 1000 mL via INTRAVENOUS

## 2022-04-15 MED ORDER — INSULIN ASPART 100 UNIT/ML IJ SOLN: 0.0000 [IU] | Freq: Every day | INTRAMUSCULAR | Status: DC

## 2022-04-15 MED ORDER — SODIUM CHLORIDE 0.9 % IV SOLN
500.0000 mg | INTRAVENOUS | Status: DC
  Administered 2022-04-15: 500 mg via INTRAVENOUS
  Filled 2022-04-15: qty 5

## 2022-04-15 MED ORDER — SODIUM CHLORIDE 0.9 % IV SOLN
2.0000 g | INTRAVENOUS | Status: DC
  Administered 2022-04-15: 2 g via INTRAVENOUS
  Filled 2022-04-15: qty 20

## 2022-04-15 MED ORDER — POTASSIUM CHLORIDE 10 MEQ/100ML IV SOLN
10.0000 meq | INTRAVENOUS | Status: AC
  Administered 2022-04-16 (×2): 10 meq via INTRAVENOUS
  Filled 2022-04-15 (×2): qty 100

## 2022-04-15 MED ORDER — DM-GUAIFENESIN ER 30-600 MG PO TB12
1.0000 | ORAL_TABLET | Freq: Two times a day (BID) | ORAL | Status: DC
  Administered 2022-04-15 – 2022-04-18 (×6): 1 via ORAL
  Filled 2022-04-15 (×6): qty 1

## 2022-04-15 MED ORDER — ACETAMINOPHEN 325 MG PO TABS: 650.0000 mg | ORAL_TABLET | Freq: Four times a day (QID) | ORAL | Status: DC | PRN

## 2022-04-15 MED ORDER — DOXYCYCLINE HYCLATE 100 MG PO TABS
100.0000 mg | ORAL_TABLET | Freq: Once | ORAL | Status: AC
  Administered 2022-04-15: 100 mg via ORAL
  Filled 2022-04-15: qty 1

## 2022-04-15 MED ORDER — INSULIN ASPART 100 UNIT/ML IJ SOLN
0.0000 [IU] | Freq: Three times a day (TID) | INTRAMUSCULAR | Status: DC
  Administered 2022-04-16: 2 [IU] via SUBCUTANEOUS
  Administered 2022-04-16: 5 [IU] via SUBCUTANEOUS
  Administered 2022-04-17: 8 [IU] via SUBCUTANEOUS
  Administered 2022-04-17: 3 [IU] via SUBCUTANEOUS
  Filled 2022-04-15 (×2): qty 1

## 2022-04-15 MED ORDER — ENOXAPARIN SODIUM 40 MG/0.4ML IJ SOSY
40.0000 mg | PREFILLED_SYRINGE | INTRAMUSCULAR | Status: DC
  Administered 2022-04-16 – 2022-04-18 (×3): 40 mg via SUBCUTANEOUS
  Filled 2022-04-15 (×3): qty 0.4

## 2022-04-15 MED ORDER — LACTATED RINGERS IV SOLN: INTRAVENOUS | Status: DC

## 2022-04-15 NOTE — ED Triage Notes (Signed)
Pt brought in by [facility] from home with c/o fall at home today getting out of his bed. Denies any injury from the fall. Pt does report generalized weakness and family called EMS because he was unable to get up from the floor. EMS found pt to have a fever of 101 and CBG 197. Pt does report nausea and vomiting started this morning with slight abdominal pain.  ?

## 2022-04-15 NOTE — ED Provider Notes (Signed)
This patient is a 62 ?[facility] ?Provider Note ? ? ?CSN: [id number] ?Arrival date & time: 04/15/22  1750 ? ?  ? ?History ? ?Chief Complaint  ?Patient presents with  ? Fall  ? ? ?Henry [name] is a 71 y.o. male. ? ? ?Fall ? ?71 year old male, presenting to the hospital with generalized weakness and a fall that occurred when he was trying to get out of a chair in his bedroom.  Evidently has been feeling poorly for the last few days, his sister who he lives with has been checking his temperature but he has been afebrile until today when she measured 106.5 temperature at home.  She noticed him to have shaking chills and severe weakness.  The patient has had some vomiting or coughing up of phlegm, feeling generally weak, getting worse, required EMS assistance getting off of the floor due to severe weakness.  Evidently has a daughter that was recently admitted to the hospital with community-acquired pneumonia and he does report picking 3 ticks off of him a week ago 1 of which was embedded.  He has not had any rashes to the skin, denies any difficulty with urination or defecation ?  ? ?Home Medications ?Prior to Admission medications   ?Medication Sig Start Date End Date Taking? Authorizing Provider  ?cetirizine (ZYRTEC) 10 MG tablet Take 10 mg by mouth daily.   Yes [provider]  ?diphenhydramine-acetaminophen (TYLENOL PM) 25-500 MG TABS Take 1 tablet by mouth at bedtime as needed (pain/sleep).   Yes [provider]  ?metFORMIN (GLUCOPHAGE) 500 MG tablet Take 500 mg by mouth 2 (two) times daily. 11/01/21  Yes [provider]  ?Multiple Vitamins-Minerals (MULTIVITAMIN WITH MINERALS) tablet Take 1 tablet by mouth daily.   Yes [provider]  ?omeprazole (PRILOSEC) 20 MG capsule Take 20 mg by mouth daily.     Yes [provider]  ?   ? ?Allergies    ?Patient has no known allergies.   ? ?Review of Systems   ?Review of Systems  ?All other systems reviewed and  are negative. ? ?Physical Exam ?Updated Vital Signs ?BP 140/87   Pulse (!) 117   Temp 99.8 ?F (37.7 ?C) (Oral)   Resp (!) 25   Ht 1.803 m ('5\' 11"'$ )   Wt 90.1 kg   SpO2 97%   BMI 27.70 kg/m?  ?Physical Exam ?Vitals and nursing note reviewed.  ?Constitutional:   ?   General: He is not in acute distress. ?   Appearance: He is well-developed.  ?HENT:  ?   Head: Normocephalic and atraumatic.  ?   Nose: Nose normal.  ?   Mouth/Throat:  ?   Mouth: Mucous membranes are dry.  ?   Pharynx: No oropharyngeal exudate.  ?Eyes:  ?   General: No scleral icterus.    ?   Right eye: No discharge.     ?   Left eye: No discharge.  ?   Conjunctiva/sclera: Conjunctivae normal.  ?   Pupils: Pupils are equal, round, and reactive to light.  ?Neck:  ?   Thyroid: No thyromegaly.  ?   Vascular: No JVD.  ?Cardiovascular:  ?   Rate and Rhythm: Tachycardia present. Rhythm irregular.  ?   Heart sounds: Normal heart sounds. No murmur heard. ?  No friction rub. No gallop.  ?Pulmonary:  ?   Effort: Pulmonary effort is normal. No respiratory distress.  ?   Breath sounds: Normal breath sounds. No wheezing or  rales.  ?Abdominal:  ?   General: Bowel sounds are normal. There is no distension.  ?   Palpations: Abdomen is soft. There is no mass.  ?   Tenderness: There is no abdominal tenderness.  ?Musculoskeletal:     ?   General: No tenderness. Normal range of motion.  ?   Cervical back: Normal range of motion and neck supple.  ?   Right lower leg: No edema.  ?   Left lower leg: No edema.  ?Lymphadenopathy:  ?   Cervical: No cervical adenopathy.  ?Skin: ?   General: Skin is warm and dry.  ?   Findings: No erythema or rash.  ?Neurological:  ?   Mental Status: He is alert.  ?   Coordination: Coordination normal.  ?   Comments: Able to use all 4 extremities, generally weak, awake and alert, mildly somnolent  ?Psychiatric:     ?   Behavior: Behavior normal.  ? ? ?ED Results / Procedures / Treatments   ?Labs ?(all labs ordered are listed, but only abnormal  results are displayed) ?Labs Reviewed  ?LACTIC ACID, PLASMA - Abnormal; Notable for the following components:  ?    Result Value  ? Lactic Acid, Venous 2.3 (*)   ? All other components within normal limits  ?LACTIC ACID, PLASMA - Abnormal; Notable for the following components:  ? Lactic Acid, Venous 2.7 (*)   ? All other components within normal limits  ?COMPREHENSIVE METABOLIC PANEL - Abnormal; Notable for the following components:  ? Potassium 3.3 (*)   ? Glucose, Bld 199 (*)   ? BUN 26 (*)   ? AST 44 (*)   ? All other components within normal limits  ?CBC WITH DIFFERENTIAL/PLATELET - Abnormal; Notable for the following components:  ? WBC 17.9 (*)   ? Neutro Abs 16.3 (*)   ? Lymphs Abs 0.4 (*)   ? Abs Immature Granulocytes 0.93 (*)   ? All other components within normal limits  ?RESP PANEL BY RT-PCR (FLU A&B, COVID) ARPGX2  ?CULTURE, BLOOD (ROUTINE X 2)  ?CULTURE, BLOOD (ROUTINE X 2)  ?URINE CULTURE  ?PROTIME-INR  ?APTT  ?URINALYSIS, ROUTINE W REFLEX MICROSCOPIC  ?ROCKY MTN SPOTTED FVR ABS PNL(IGG+IGM)  ?LYME DISEASE SEROLOGY W/REFLEX  ? ? ?EKG ?EKG Interpretation ? ?Date/Time:  Wednesday Apr 15 2022 18:33:22 EDT ?Ventricular Rate:  124 ?PR Interval:  132 ?QRS Duration: 112 ?QT Interval:  316 ?QTC Calculation: 502 ?R Axis:   69 ?Text Interpretation: Sinus tachycardia Multiple premature complexes, vent & supraven Incomplete left bundle branch block ST depr, consider ischemia, anterolateral lds no old EKG Confirmed by Noemi Chapel [phone]) on 04/15/2022 6:55:22 PM ? ?Radiology ?DG Chest Port 1 View ? ?Result Date: 04/15/2022 ?CLINICAL DATA:  History of questionable sepsis EXAM: PORTABLE CHEST 1 VIEW COMPARISON:  None Available. FINDINGS: The cardiomediastinal silhouette is enlarged in contour. No pleural effusion. No pneumothorax. LEFT retrocardiac opacity, likely atelectasis. Visualized abdomen is unremarkable. Multilevel degenerative changes of the thoracic spine. IMPRESSION: LEFT retrocardiac opacity, favor  atelectasis. Differential considerations include infection. Electronically Signed   By: Valentino Saxon M.D.   On: 04/15/2022 18:58   ? ?Procedures ?Marland KitchenCritical Care ?Performed by: Noemi Chapel, MD ?Authorized by: Noemi Chapel, MD  ? ?Critical care provider statement:  ?  Critical care time (minutes):  30 ?  Critical care was necessary to treat or prevent imminent or life-threatening deterioration of the following conditions:  Sepsis ?  Critical care was time spent personally by me  on the following activities:  Development of treatment plan with patient or surrogate, discussions with consultants, evaluation of patient's response to treatment, examination of patient, ordering and review of laboratory studies, ordering and review of radiographic studies, ordering and performing treatments and interventions, pulse oximetry, re-evaluation of patient's condition, review of old charts and obtaining history from patient or surrogate ?  I assumed direction of critical care for this patient from another provider in my specialty: no   ?  Care discussed with: admitting provider   ?Comments:  ?    ?  ? ? ?Medications Ordered in ED ?Medications  ?lactated ringers infusion ( Intravenous New Bag/Given 04/15/22 2053)  ?cefTRIAXone (ROCEPHIN) 2 g in sodium chloride 0.9 % 100 mL IVPB (0 g Intravenous Stopped 04/15/22 1939)  ?azithromycin (ZITHROMAX) 500 mg in sodium chloride 0.9 % 250 mL IVPB (0 mg Intravenous Stopped 04/15/22 2054)  ?sodium chloride 0.9 % bolus 1,000 mL (0 mLs Intravenous Stopped 04/15/22 1951)  ?  And  ?sodium chloride 0.9 % bolus 1,000 mL (0 mLs Intravenous Stopped 04/15/22 1952)  ?  And  ?sodium chloride 0.9 % bolus 1,000 mL (0 mLs Intravenous Stopped 04/15/22 2048)  ?doxycycline (VIBRA-TABS) tablet 100 mg (100 mg Oral Given 04/15/22 1856)  ?LORazepam (ATIVAN) injection 0.5 mg (0.5 mg Intravenous Given 04/15/22 2002)  ? ? ?ED Course/ Medical Decision Making/ A&P ?  ?                        ?Medical Decision  Making ?Amount and/or Complexity of Data Reviewed ?Labs: ordered. ?Radiology: ordered. ?ECG/medicine tests: ordered. ? ?Risk ?Prescription drug management. ?Decision regarding hospitalization. ? ? ?This patient presents to th

## 2022-04-15 NOTE — ED Notes (Signed)
Sister, Joanne Chars, is leaving but will come back if needed. Other sister, Sondra Barges, is coming to sit with pt ?

## 2022-04-15 NOTE — Sepsis Progress Note (Signed)
Following per sepsis protocol   

## 2022-04-15 NOTE — H&P (Addendum)
History and Physical    Patient: Henry [name] BTD:[id number] DOB: [birth year] DOA: 04/15/2022 DOS: the patient was seen and examined on 04/16/2022 PCP: Curlene Labrum, MD  Patient coming from: Home  Chief Complaint:  Chief Complaint  Patient presents with   Fall   HPI: Henry [name] is a 71 y.o. male with medical history significant of esophageal dysphagia, GERD and type II DM who presents to the emergency department due to 3 day onset of generalized weakness and feeling poorly.  Patient was not able to provide a history, history was obtained from ED physician and ED medical record.  Per report, patient sustained a fall today while trying to get out of a chair in his bedroom.  Patient lives with sister who has been checking his temperature since feeling poorly, he has been afebrile until today when his temperature spiked to 106.16F, this was associated with excessive sweating and shaking as well as some vomiting.  Patient was unable to get up from the floor after the sustained fall, so sister activated EMS who helped to get patient up from the floor and he was taken to the ED for further evaluation and management.  It was reported that patient recently had a recent exposure to ticks, of which 3 were picked off in the last week and 1 of these was embedded.  Patient denies any skin rash.   ED Course:  In the emergency department, he was tachypneic, tachycardic and was on supplemental oxygen via [place] at 2 LPM with an O2 sat of 94-97%.  Work-up in the ED showed normal CBC except for leukocytosis, BMP showed hypokalemia, BUN 26, continue 0.85, glucose 199, lactic acid 2.3 > 2.7.  Urinalysis was unimpressive for UTI.  Influenza A, B, SARS coronavirus 2 was negative.  Blood culture was pending Chest x-ray showed left retrocardiac opacity, favor atelectasis.  Differential considerations include infection. Doxycycline was given, she was started on IV ceftriaxone and azithromycin, IV hydration per sepsis  protocol was provided and IV Ativan 0.5 mg x 1 was given.  Hospitalist was asked to admit patient for further evaluation and management.  Review of Systems: Review of systems as noted in the HPI. All other systems reviewed and are negative.   Past Medical History:  Diagnosis Date   Diabetes ([place])    GERD (gastroesophageal reflux disease)    Mental disability    Past Surgical History:  Procedure Laterality Date   ANKLE FRACTURE SURGERY     pin   BALLOON DILATION N/A 12/30/2021   Procedure: BALLOON DILATION;  Surgeon: Eloise Harman, DO;  [facility];  Service: Endoscopy;  Laterality: N/A;   cervical fracture     wore halo brace   COLONOSCOPY  05/18/2011   Rourk; Left sided diverticula, otherwise normal exam.  Repeat in 10 years.   COLONOSCOPY WITH PROPOFOL N/A 12/30/2021   Procedure: COLONOSCOPY WITH PROPOFOL;  Surgeon: Eloise Harman, DO;  Location: [facility];  Service: Endoscopy;  Laterality: N/A;  10:45am   ESOPHAGOGASTRODUODENOSCOPY  10/30/2005   distal esophageal narrowing s/p dilation up to 33m. ?achalasia?   ESOPHAGOGASTRODUODENOSCOPY  05/18/2011   Rourk; Submucosal ring at GE junction s/p dilation, hiatal hernia, normal examined stomach, questionable scalloping at D2 and D3 s/p biopsy.  Pathology with chronic duodenitis consistent with peptic duodenitis.   ESOPHAGOGASTRODUODENOSCOPY  10/2019   Dr. DStaci Righter moderate dilation of upper third of the esophagus and middle third of the esophagus s/p biopsied, otherwise normal exam.  Recommended considering manometry to rule out achalasia versus BPE. No dilation performed.   ESOPHAGOGASTRODUODENOSCOPY  11/28/2019   Dr. Jill Alexanders; Moderate dilation of upper and middle third of the esophagus s/p biopsied, no dilatable stricture seen, normal examined stomach and duodenum. Pathology was benign.   ESOPHAGOGASTRODUODENOSCOPY (EGD) WITH PROPOFOL N/A 12/30/2021   Procedure: ESOPHAGOGASTRODUODENOSCOPY (EGD) WITH  PROPOFOL;  Surgeon: Eloise Harman, DO;  [facility];  Service: Endoscopy;  Laterality: N/A;   POLYPECTOMY  12/30/2021   Procedure: POLYPECTOMY;  Surgeon: Eloise Harman, DO;  [facility];  Service: Endoscopy;;   TONSILLECTOMY      Social History:  reports that he has never smoked. He has quit using smokeless tobacco.  His smokeless tobacco use included snuff. He reports that he does not currently use alcohol. He reports that he does not use drugs.   No Known Allergies  Family History  Problem Relation Age of Onset   Colon cancer Neg Hx      Prior to Admission medications   Medication Sig Start Date End Date Taking? Authorizing Provider  cetirizine (ZYRTEC) 10 MG tablet Take 10 mg by mouth daily.   Yes [provider]  diphenhydramine-acetaminophen (TYLENOL PM) 25-500 MG TABS Take 1 tablet by mouth at bedtime as needed (pain/sleep).   Yes [provider]  metFORMIN (GLUCOPHAGE) 500 MG tablet Take 500 mg by mouth 2 (two) times daily. 11/01/21  Yes [provider]  Multiple Vitamins-Minerals (MULTIVITAMIN WITH MINERALS) tablet Take 1 tablet by mouth daily.   Yes [provider]  omeprazole (PRILOSEC) 20 MG capsule Take 20 mg by mouth daily.     Yes [provider]    Physical Exam: BP (!) 130/96   Pulse (!) 119   Temp (!) 104 F (40 C) (Rectal)   Resp (!) 39   Ht 5' 11"  (1.803 m)   Wt 90.1 kg   SpO2 93%   BMI 27.70 kg/m   General: 71 y.o. year-old male well developed well nourished in no acute distress.  Alert and oriented x3. HEENT: NCAT, EOMI, dry mucous membrane Neck: Supple, trachea medial Cardiovascular: Tachycardia.  Irregular rate and rhythm with no rubs or gallops.  No thyromegaly or JVD noted.  No lower extremity edema. 2/4 pulses in all 4 extremities. Respiratory: Clear to auscultation with no wheezes or rales. Good inspiratory effort. Abdomen: Soft, nontender nondistended with normal bowel  sounds x4 quadrants. Muskuloskeletal: No cyanosis, clubbing or edema noted bilaterally Neuro: CN II-XII intact, sensation, reflexes intact Skin: No ulcerative lesions noted or rashes Psychiatry: Judgement and insight appear normal. Mood is appropriate for condition and setting          Labs on Admission:  Basic Metabolic Panel: Recent Labs  Lab 04/15/22 1847  NA 136  K 3.3*  CL 98  CO2 24  GLUCOSE 199*  BUN 26*  CREATININE 0.85  CALCIUM 9.1   Liver Function Tests: Recent Labs  Lab 04/15/22 1847  AST 44*  ALT 20  ALKPHOS 76  BILITOT 1.0  PROT 7.6  ALBUMIN 3.8   No results for input(s): LIPASE, AMYLASE in the last 168 hours. No results for input(s): AMMONIA in the last 168 hours. CBC: Recent Labs  Lab 04/15/22 1847  WBC 17.9*  NEUTROABS 16.3*  HGB 13.6  HCT 42.1  MCV 90.7  PLT 200   Cardiac Enzymes: No results for input(s): CKTOTAL, CKMB, CKMBINDEX, TROPONINI in the last 168 hours.  BNP (last 3 results) No  results for input(s): BNP in the last 8760 hours.  ProBNP (last 3 results) No results for input(s): PROBNP in the last 8760 hours.  CBG: No results for input(s): GLUCAP in the last 168 hours.  Radiological Exams on Admission: DG Chest Port 1 View  Result Date: 04/15/2022 CLINICAL DATA:  History of questionable sepsis EXAM: PORTABLE CHEST 1 VIEW COMPARISON:  None Available. FINDINGS: The cardiomediastinal silhouette is enlarged in contour. No pleural effusion. No pneumothorax. LEFT retrocardiac opacity, likely atelectasis. Visualized abdomen is unremarkable. Multilevel degenerative changes of the thoracic spine. IMPRESSION: LEFT retrocardiac opacity, favor atelectasis. Differential considerations include infection. Electronically Signed   By: Valentino Saxon M.D.   On: 04/15/2022 18:58    EKG: I independently viewed the EKG done and my findings are as followed: Sinus tachycardia at rate 124 bpm with multiple ventricular and supraventricular premature  contractions.  Assessment/Plan Present on Admission:  Sepsis due to undetermined organism ([place])  GERD (gastroesophageal reflux disease)  Principal Problem:   Sepsis due to undetermined organism [facility]) Active Problems:   GERD (gastroesophageal reflux disease)   Lactic acidosis   Hypokalemia   Uncontrolled type 2 diabetes mellitus with hyperglycemia, without long-term current use of insulin ([place])   Fall at home, initial encounter   Acute febrile illness  Sepsis due to undetermined organism Acute febrile illness Patient was tachypneic, tachycardic and present with leukocytosis (met SIRS criteria), chest x-ray was suspicious for infectious process.  Lactic acid was elevated. IV hydration per sepsis protocol was provided He was empirically started on IV ceftriaxone and azithromycin.  We shall continue with IV ceftriaxone and doxycycline at this time with plan to de-escalate/as tolerated based on blood culture, sputum culture, urine Legionella, strep pneumo and procalcitonin Continue Tylenol as needed Continue Mucinex, incentive spirometry, flutter valve   Lactic acidosis possibly secondary to above Lactic acid 2.3 > 2.7, and IV hydration Continue to monitor lactic acid  Acute respiratory failure with hypoxia This is possibly secondary to suspected infectious process in the lung Continue supplemental oxygen via  to maintain O2 sats > 94% with goal to de-escalate/discontinue as tolerated (patient does not use supplemental oxygen at baseline)  Fall at home Continue fall precaution neurochecks Continue PT/OT eval and treat  Hypokalemia K+ 3.3, this will be replenished  Uncontrolled type 2 diabetes mellitus with hyperglycemia Continue ISS and hypoglycemia protocol Metformin will be held at this time  Reported tick exposure Patient had no noticeable rash on skin Doxycycline was provided in the ED, continue IV doxycycline  GERD Continue Protonix  DVT prophylaxis:  Lovenox  Code Status: Full code  Consults: None  Family Communication: None at bedside  Severity of Illness: The appropriate patient status for this patient is INPATIENT. Inpatient status is judged to be reasonable and necessary in order to provide the required intensity of service to ensure the patient's safety. The patient's presenting symptoms, physical exam findings, and initial radiographic and laboratory data in the context of their chronic comorbidities is felt to place them at high risk for further clinical deterioration. Furthermore, it is not anticipated that the patient will be medically stable for discharge from the hospital within 2 midnights of admission.   * I certify that at the point of admission it is my clinical judgment that the patient will require inpatient hospital care spanning beyond 2 midnights from the point of admission due to high intensity of service, high risk for further deterioration and high frequency of surveillance required.*  Author: Bernadette Hoit, DO 04/16/2022  12:08 AM  For on call review [email or web address].

## 2022-04-15 NOTE — Progress Notes (Signed)
Elink following sepsis bundle. °

## 2022-04-15 NOTE — ED Notes (Signed)
Pt given a container to put his teeth in ?

## 2022-04-16 ENCOUNTER — Encounter (HOSPITAL_COMMUNITY): Payer: Self-pay | Admitting: Internal Medicine

## 2022-04-16 ENCOUNTER — Inpatient Hospital Stay (HOSPITAL_COMMUNITY): Payer: Medicare Other

## 2022-04-16 DIAGNOSIS — R509 Fever, unspecified: Secondary | ICD-10-CM

## 2022-04-16 DIAGNOSIS — E872 Acidosis, unspecified: Secondary | ICD-10-CM

## 2022-04-16 DIAGNOSIS — E1165 Type 2 diabetes mellitus with hyperglycemia: Secondary | ICD-10-CM

## 2022-04-16 DIAGNOSIS — Y92009 Unspecified place in unspecified non-institutional (private) residence as the place of occurrence of the external cause: Secondary | ICD-10-CM

## 2022-04-16 DIAGNOSIS — E876 Hypokalemia: Secondary | ICD-10-CM

## 2022-04-16 DIAGNOSIS — A419 Sepsis, unspecified organism: Secondary | ICD-10-CM | POA: Diagnosis not present

## 2022-04-16 LAB — GLUCOSE, CAPILLARY
Glucose-Capillary: 75 mg/dL (ref 70–99)
Glucose-Capillary: 112 mg/dL — ABNORMAL HIGH (ref 70–99)

## 2022-04-16 LAB — COMPREHENSIVE METABOLIC PANEL
ALT: 16 U/L (ref 0–44)
AST: 40 U/L (ref 15–41)
Albumin: 2.7 g/dL — ABNORMAL LOW (ref 3.5–5.0)
Alkaline Phosphatase: 54 U/L (ref 38–126)
Anion gap: 8 (ref 5–15)
BUN: 21 mg/dL (ref 8–23)
CO2: 22 mmol/L (ref 22–32)
Calcium: 7.8 mg/dL — ABNORMAL LOW (ref 8.9–10.3)
Chloride: 107 mmol/L (ref 98–111)
Creatinine, Ser: 0.79 mg/dL (ref 0.61–1.24)
GFR, Estimated: >60 mL/min (ref >60)
Glucose, Bld: 178 mg/dL — ABNORMAL HIGH (ref 70–99)
Potassium: 3.6 mmol/L (ref 3.5–5.1)
Sodium: 137 mmol/L (ref 135–145)
Total Bilirubin: 0.7 mg/dL (ref 0.3–1.2)
Total Protein: 5.7 g/dL — ABNORMAL LOW (ref 6.5–8.1)

## 2022-04-16 LAB — EXPECTORATED SPUTUM ASSESSMENT W GRAM STAIN, RFLX TO RESP C

## 2022-04-16 LAB — PROTIME-INR
INR: 1.3 — ABNORMAL HIGH (ref 0.8–1.2)
Prothrombin Time: 16.2 seconds — ABNORMAL HIGH (ref 11.4–15.2)

## 2022-04-16 LAB — PHOSPHORUS: Phosphorus: 1.8 mg/dL — ABNORMAL LOW (ref 2.5–4.6)

## 2022-04-16 LAB — STREP PNEUMONIAE URINARY ANTIGEN: Strep Pneumo Urinary Antigen: POSITIVE — AB

## 2022-04-16 LAB — CBC
HCT: 34.9 % — ABNORMAL LOW (ref 39.0–52.0)
Hemoglobin: 11.9 g/dL — ABNORMAL LOW (ref 13.0–17.0)
MCH: 31 pg (ref 26.0–34.0)
MCHC: 34.1 g/dL (ref 30.0–36.0)
MCV: 90.9 fL (ref 80.0–100.0)
Platelets: 171 10*3/uL (ref 150–400)
RBC: 3.84 MIL/uL — ABNORMAL LOW (ref 4.22–5.81)
RDW: 14.3 % (ref 11.5–15.5)
WBC: 16.3 10*3/uL — ABNORMAL HIGH (ref 4.0–10.5)
nRBC: 0 % (ref 0.0–0.2)

## 2022-04-16 LAB — PROCALCITONIN: Procalcitonin: 15.33 ng/mL

## 2022-04-16 LAB — MAGNESIUM: Magnesium: 1.7 mg/dL (ref 1.7–2.4)

## 2022-04-16 LAB — HEMOGLOBIN A1C
Hgb A1c MFr Bld: 7.1 % — ABNORMAL HIGH (ref 4.8–5.6)
Mean Plasma Glucose: 157.07 mg/dL

## 2022-04-16 LAB — CBG MONITORING, ED
Glucose-Capillary: 133 mg/dL — ABNORMAL HIGH (ref 70–99)
Glucose-Capillary: 170 mg/dL — ABNORMAL HIGH (ref 70–99)
Glucose-Capillary: 222 mg/dL — ABNORMAL HIGH (ref 70–99)

## 2022-04-16 MED ORDER — SODIUM CHLORIDE 0.9 % IV SOLN
100.0000 mg | Freq: Two times a day (BID) | INTRAVENOUS | Status: DC
  Filled 2022-04-16 (×3): qty 100

## 2022-04-16 MED ORDER — KETOROLAC TROMETHAMINE 15 MG/ML IJ SOLN
15.0000 mg | Freq: Once | INTRAMUSCULAR | Status: AC
  Administered 2022-04-16: 15 mg via INTRAVENOUS
  Filled 2022-04-16: qty 1

## 2022-04-16 MED ORDER — LACTATED RINGERS IV SOLN: INTRAVENOUS | Status: AC

## 2022-04-16 MED ORDER — PANTOPRAZOLE SODIUM 40 MG PO TBEC
40.0000 mg | DELAYED_RELEASE_TABLET | Freq: Every day | ORAL | Status: DC
  Administered 2022-04-16 – 2022-04-18 (×3): 40 mg via ORAL
  Filled 2022-04-16 (×3): qty 1

## 2022-04-16 MED ORDER — SODIUM CHLORIDE 0.9 % IV SOLN
3.0000 g | Freq: Four times a day (QID) | INTRAVENOUS | Status: DC
  Administered 2022-04-16 – 2022-04-18 (×9): 3 g via INTRAVENOUS
  Filled 2022-04-16 (×9): qty 8

## 2022-04-16 NOTE — Evaluation (Signed)
Physical Therapy Evaluation Patient Details Name: Henry [name] MRN: [id number] DOB: 15-Feb-1951 Today's Date: 04/16/2022  History of Present Illness  Henry [name] is a 71 y.o. male with medical history significant of esophageal dysphagia, GERD and type II DM who presents to the emergency department due to 3 day onset of generalized weakness and feeling poorly.  Patient was not able to provide a history, history was obtained from ED physician and ED medical record.  Per report, patient sustained a fall today while trying to get out of a chair in his bedroom.  Patient lives with sister who has been checking his temperature since feeling poorly, he has been afebrile until today when his temperature spiked to 106.11F, this was associated with excessive sweating and shaking as well as some vomiting.  Patient was unable to get up from the floor after the sustained fall, so sister activated EMS who helped to get patient up from the floor and he was taken to the ED for further evaluation and management.  It was reported that patient recently had a recent exposure to ticks, of which 3 were picked off in the last week and 1 of these was embedded.  Patient denies any skin rash.   Clinical Impression  Patient demonstrates good return for sitting up at bedside, unsteady on feet without AD, had to use RW for support with fair/good return demonstrated for use during ambulation in room/hallway without loss of balance.  Patient limited for ambulation mostly due to fatigue and tolerated sitting up on commode in bathroom for a bowel movement with nursing staff present in room after therapy.  Patient will benefit from continued skilled physical therapy in hospital and recommended venue below to increase strength, balance, endurance for safe ADLs and gait.        Recommendations for follow up therapy are one component of a multi-disciplinary discharge planning process, led by the attending physician.  Recommendations may be  updated based on patient status, additional functional criteria and insurance authorization.  Follow Up Recommendations Home health PT    Assistance Recommended at Discharge Intermittent Supervision/Assistance  Patient can return home with the following  A little help with walking and/or transfers;A little help with bathing/dressing/bathroom;Assistance with cooking/housework;Help with stairs or ramp for entrance    Equipment Recommendations Rolling walker (2 wheels)  Recommendations for Other Services       Functional Status Assessment Patient has had a recent decline in their functional status and demonstrates the ability to make significant improvements in function in a reasonable and predictable amount of time.     Precautions / Restrictions Precautions Precautions: Fall Restrictions Weight Bearing Restrictions: No      Mobility  Bed Mobility Overal bed mobility: Needs Assistance Bed Mobility: Supine to Sit, Sit to Supine     Supine to sit: Supervision Sit to supine: Supervision   General bed mobility comments: slightly labored movement, increased time    Transfers Overall transfer level: Needs assistance Equipment used: Rolling walker (2 wheels) Transfers: Sit to/from Stand, Bed to chair/wheelchair/BSC Sit to Stand: Min guard   Step pivot transfers: Min guard, Min assist       General transfer comment: slow labored movement requiring verbal/tactile for safty when transferring to low commode    Ambulation/Gait Ambulation/Gait assistance: Min guard, Min assist Gait Distance (Feet): 75 Feet Assistive device: Rolling walker (2 wheels) Gait Pattern/deviations: Decreased step length - right, Decreased step length - left, Decreased stride length, Trunk flexed Gait velocity: decreased  General Gait Details: slow slightly labored cadence without loss of balance, limited mostly due to fatigue  Stairs            Wheelchair Mobility    Modified Rankin  (Stroke Patients Only)       Balance Overall balance assessment: Needs assistance Sitting-balance support: Feet supported, No upper extremity supported Sitting balance-Leahy Scale: Good Sitting balance - Comments: seated at EOB   Standing balance support: During functional activity, No upper extremity supported Standing balance-Leahy Scale: Poor Standing balance comment: fair using RW                             Pertinent Vitals/Pain Pain Assessment Pain Assessment: No/denies pain    Home Living Family/patient expects to be discharged to:: Private residence Living Arrangements: Other relatives Available Help at Discharge: Family;Available 24 hours/day Type of Home: House Home Access: Stairs to enter Entrance Stairs-Rails: None Entrance Stairs-Number of Steps: 1   Home Layout: One level Home Equipment: None      Prior Function Prior Level of Function : Independent/Modified Independent             Mobility Comments: household and short distanced community ambulator without AD ADLs Comments: assisted by family     Hand Dominance        Extremity/Trunk Assessment   Upper Extremity Assessment Upper Extremity Assessment: Defer to OT evaluation    Lower Extremity Assessment Lower Extremity Assessment: Generalized weakness    Cervical / Trunk Assessment Cervical / Trunk Assessment: Kyphotic  Communication   Communication: No difficulties  Cognition Arousal/Alertness: Awake/alert Behavior During Therapy: WFL for tasks assessed/performed Overall Cognitive Status: Within Functional Limits for tasks assessed                                          General Comments      Exercises     Assessment/Plan    PT Assessment Patient needs continued PT services  PT Problem List Decreased strength;Decreased activity tolerance;Decreased balance;Decreased mobility       PT Treatment Interventions DME instruction;Gait training;Stair  training;Functional mobility training;Therapeutic activities;Therapeutic exercise;Balance training;Patient/family education    PT Goals (Current goals can be found in the Care Plan section)  Acute Rehab PT Goals Patient Stated Goal: return home with family to assist PT Goal Formulation: With patient/family Time For Goal Achievement: 04/21/22 Potential to Achieve Goals: Good    Frequency Min 3X/week     Co-evaluation               AM-PAC PT "6 Clicks" Mobility  Outcome Measure Help needed turning from your back to your side while in a flat bed without using bedrails?: None Help needed moving from lying on your back to sitting on the side of a flat bed without using bedrails?: None Help needed moving to and from a bed to a chair (including a wheelchair)?: A Little Help needed standing up from a chair using your arms (e.g., wheelchair or bedside chair)?: A Little Help needed to walk in hospital room?: A Little Help needed climbing 3-5 steps with a railing? : A Little 6 Click Score: 20    End of Session   Activity Tolerance: Patient tolerated treatment well;Patient limited by fatigue Patient left: with call bell/phone within reach;with nursing/sitter in room;Other (comment) Nurse Communication: Mobility status (left sitting on commode  in room with NT present) PT Visit Diagnosis: Unsteadiness on feet (R26.81);Other abnormalities of gait and mobility (R26.89);Muscle weakness (generalized) (M62.81)    Time: 8677-3736 PT Time Calculation (min) (ACUTE ONLY): 32 min   Charges:   PT Evaluation $PT Eval Moderate Complexity: 1 Mod PT Treatments $Therapeutic Activity: 23-37 mins        12:15 PM, 04/16/22 Lonell Grandchild, MPT Physical Therapist with [facility] [phone] office [phone] mobile phone

## 2022-04-16 NOTE — ED Notes (Signed)
Patient transported to X-ray 

## 2022-04-16 NOTE — Progress Notes (Signed)
PROGRESS NOTE    Henry [name]  KGY:[id number] DOB: 08-Nov-1951 DOA: 04/15/2022 PCP: Curlene Labrum, MD  71/M with history of type 2 diabetes mellitus, esophageal dysphagia, GERD presented to the ED with generalized weakness, fall and fevers. -History of vomiting, known esophageal dysphagia, previously had strictures reported requiring dilation, subsequently found to have achalasia and, due to have a repeat endoscopy next month. -In the ED he was febrile to 104, tachycardic, tachypneic, mildly hypoxic, labs noted leukocytosis, lactic acidosis,, influenza and COVID PCR were negative, chest x-ray noted left retrocardiac opacity atelectasis versus infiltrate  Subjective: -Reports vomiting yesterday, denies any abdominal pain, some cough today  Assessment and Plan:  Sepsis, poa Suspected aspiration pneumonia -Known history of esophageal dysphagia -DC current antibiotics, start IV Unasyn -Follow-up blood cultures, Supportive care  Acute hypoxic respiratory failure -Mild in the setting of sepsis and aspiration pneumonia, BNP was tolerated, repeat chest x-ray tomorrow  Esophageal dysphagia -Prior history of strictures requiring dilation and most recently noted to have dilated esophagus on EGD, suspected to have a chalazia -Continue PPI -Check barium esophagram -Has an upcoming appointment with GI for endoscopy in June  Hypokalemia -Replaced  Recent tick exposure -No noticeable skin rash -Monitor clinically  Type 2 diabetes mellitus -Hold metformin, CBGs are stable, continue sliding scale insulin  DVT prophylaxis: Lovenox Code Status: Full code Family Communication: Sister at bedside Disposition Plan: Home likely 2 to 3 days  Consultants:    Procedures:   Antimicrobials:    Objective: Vitals:   04/16/22 0530 04/16/22 0600 04/16/22 0615 04/16/22 0630  BP: 97/65 93/72  95/73  Pulse: 72 73 66 62  Resp: (!) 27 (!) 29 20 (!) 28  Temp:      TempSrc:      SpO2: 96% 98%  96% 96%  Weight:      Height:        Intake/Output Summary (Last 24 hours) at 04/16/2022 0844 Last data filed at 04/16/2022 0255 Gross per 24 hour  Intake 3551.67 ml  Output --  Net 3551.67 ml   Filed Weights   04/15/22 1819  Weight: 90.1 kg    Examination:  General exam: Chronically ill male sitting up in bed, very hard of hearing, awake alert oriented to self and partly to place, HEENT: No JVD CVS: S1-S2, regular rhythm Lungs: Scattered rhonchi in both bases Abdomen: Soft, nontender, bowel sounds present Extremities: No abdominal Skin: No rash. Psych: flat affect   Data Reviewed:   CBC: Recent Labs  Lab 04/15/22 1847 04/16/22 0323  WBC 17.9* 16.3*  NEUTROABS 16.3*  --   HGB 13.6 11.9*  HCT 42.1 34.9*  MCV 90.7 90.9  PLT 200 026   Basic Metabolic Panel: Recent Labs  Lab 04/15/22 1847 04/16/22 0323  NA 136 137  K 3.3* 3.6  CL 98 107  CO2 24 22  GLUCOSE 199* 178*  BUN 26* 21  CREATININE 0.85 0.79  CALCIUM 9.1 7.8*  MG  --  1.7  PHOS  --  1.8*   GFR: Estimated Creatinine Clearance: 88.9 mL/min (by C-G formula based on SCr of 0.79 mg/dL). Liver Function Tests: Recent Labs  Lab 04/15/22 1847 04/16/22 0323  AST 44* 40  ALT 20 16  ALKPHOS 76 54  BILITOT 1.0 0.7  PROT 7.6 5.7*  ALBUMIN 3.8 2.7*   No results for input(s): LIPASE, AMYLASE in the last 168 hours. No results for input(s): AMMONIA in the last 168 hours. Coagulation Profile: Recent Labs  Lab 04/15/22  1847 04/16/22 0323  INR 1.2 1.3*   Cardiac Enzymes: No results for input(s): CKTOTAL, CKMB, CKMBINDEX, TROPONINI in the last 168 hours. BNP (last 3 results) No results for input(s): PROBNP in the last 8760 hours. HbA1C: Recent Labs    04/16/22 0323  HGBA1C 7.1*   CBG: Recent Labs  Lab 04/16/22 0031 04/16/22 0801  GLUCAP 170* 133*   Lipid Profile: No results for input(s): CHOL, HDL, LDLCALC, TRIG, CHOLHDL, LDLDIRECT in the last 72 hours. Thyroid Function Tests: No  results for input(s): TSH, T4TOTAL, FREET4, T3FREE, THYROIDAB in the last 72 hours. Anemia Panel: No results for input(s): VITAMINB12, FOLATE, FERRITIN, TIBC, IRON, RETICCTPCT in the last 72 hours. Urine analysis:    Component Value Date/Time   COLORURINE YELLOW 04/15/2022 2144   APPEARANCEUR HAZY (A) 04/15/2022 2144   LABSPEC 1.018 04/15/2022 2144   PHURINE 5.0 04/15/2022 2144   GLUCOSEU NEGATIVE 04/15/2022 2144   HGBUR SMALL (A) 04/15/2022 2144   BILIRUBINUR NEGATIVE 04/15/2022 2144   KETONESUR 5 (A) 04/15/2022 2144   PROTEINUR 30 (A) 04/15/2022 2144   NITRITE NEGATIVE 04/15/2022 2144   LEUKOCYTESUR NEGATIVE 04/15/2022 2144   Sepsis Labs: '@LABRCNTIP'$ (procalcitonin:4,lacticidven:4)  ) Recent Results (from the past 240 hour(s))  Blood Culture (routine x 2)     Status: None (Preliminary result)   Collection Time: 04/15/22  6:47 PM   Specimen: Blood  Result Value Ref Range Status   Specimen Description BLOOD RIGHT FOREARM  Final   Special Requests   Final    BOTTLES DRAWN AEROBIC AND ANAEROBIC Blood Culture adequate volume Performed at [facility], [address] [place], [place] [address]    Culture PENDING  Incomplete   Report Status PENDING  Incomplete  Blood Culture (routine x 2)     Status: None (Preliminary result)   Collection Time: 04/15/22  6:47 PM   Specimen: Blood  Result Value Ref Range Status   Specimen Description BLOOD LEFT FOREARM  Final   Special Requests   Final    BOTTLES DRAWN AEROBIC AND ANAEROBIC Blood Culture results may not be optimal due to an excessive volume of blood received in culture bottles Performed at [facility], [address], [place], [place] [address]    Culture PENDING  Incomplete   Report Status PENDING  Incomplete  Resp Panel by RT-PCR (Flu A&B, Covid) Nasopharyngeal Swab     Status: None   Collection Time: 04/15/22  7:22 PM   Specimen: Nasopharyngeal Swab; Nasopharyngeal(NP) swabs in vial transport medium  Result Value Ref Range  Status   SARS Coronavirus 2 by RT PCR NEGATIVE NEGATIVE Final    Comment: (NOTE) SARS-CoV-2 target nucleic acids are NOT DETECTED.  The SARS-CoV-2 RNA is generally detectable in upper respiratory specimens during the acute phase of infection. The lowest concentration of SARS-CoV-2 viral copies this assay can detect is 138 copies/mL. A negative result does not preclude SARS-Cov-2 infection and should not be used as the sole basis for treatment or other patient management decisions. A negative result may occur with  improper specimen collection/handling, submission of specimen other than nasopharyngeal swab, presence of viral mutation(s) within the areas targeted by this assay, and inadequate number of viral copies(<138 copies/mL). A negative result must be combined with clinical observations, patient history, and epidemiological information. The expected result is Negative.  Fact Sheet for Patients:  EntrepreneurPulse.com.au  Fact Sheet for Healthcare Providers:  IncredibleEmployment.be  This test is no t yet approved or cleared by the [place] and  has been authorized for detection and/or diagnosis of SARS-CoV-2 by FDA under an Emergency Use Authorization (EUA). This EUA will remain  in effect (meaning this test can be used) for the duration of the COVID-19 declaration under Section 564(b)(1) of the Act, 21 U.S.C.section 360bbb-3(b)(1), unless the authorization is terminated  or revoked sooner.       Influenza A by PCR NEGATIVE NEGATIVE Final   Influenza B by PCR NEGATIVE NEGATIVE Final    Comment: (NOTE) The Xpert Xpress SARS-CoV-2/FLU/RSV plus assay is intended as an aid in the diagnosis of influenza from Nasopharyngeal swab specimens and should not be used as a sole basis for treatment. Nasal washings and aspirates are unacceptable for Xpert Xpress SARS-CoV-2/FLU/RSV testing.  Fact Sheet for  Patients: EntrepreneurPulse.com.au  Fact Sheet for Healthcare Providers: IncredibleEmployment.be  This test is not yet approved or cleared by the [place] FDA and has been authorized for detection and/or diagnosis of SARS-CoV-2 by FDA under an Emergency Use Authorization (EUA). This EUA will remain in effect (meaning this test can be used) for the duration of the COVID-19 declaration under Section 564(b)(1) of the Act, 21 U.S.C. section 360bbb-3(b)(1), unless the authorization is terminated or revoked.  Performed at [facility], [address] [place], [place] [address]   Expectorated Sputum Assessment w Gram Stain, Rflx to Resp Cult     Status: None   Collection Time: 04/15/22 11:54 PM   Specimen: Expectorated Sputum  Result Value Ref Range Status   Specimen Description EXPECTORATED SPUTUM  Final   Special Requests NONE  Final   Sputum evaluation   Final    THIS SPECIMEN IS ACCEPTABLE FOR SPUTUM CULTURE Performed at [facility], [address], [place],  [address]    Report Status 04/16/2022 FINAL  Final     Radiology Studies: [facility] Chest Port 1 View  Result Date: 04/15/2022 CLINICAL DATA:  History of questionable sepsis EXAM: PORTABLE CHEST 1 VIEW COMPARISON:  None Available. FINDINGS: The cardiomediastinal silhouette is enlarged in contour. No pleural effusion. No pneumothorax. LEFT retrocardiac opacity, likely atelectasis. Visualized abdomen is unremarkable. Multilevel degenerative changes of the thoracic spine. IMPRESSION: LEFT retrocardiac opacity, favor atelectasis. Differential considerations include infection. Electronically Signed   By: Valentino Saxon M.D.   On: 04/15/2022 18:58     Scheduled Meds:  dextromethorphan-guaiFENesin  1 tablet Oral BID   enoxaparin (LOVENOX) injection  40 mg Subcutaneous Q24H   insulin aspart  0-15 Units Subcutaneous TID WC   insulin aspart  0-5 Units Subcutaneous QHS   pantoprazole  40 mg  Oral Daily   Continuous Infusions:  cefTRIAXone (ROCEPHIN)  IV Stopped (04/15/22 1939)   doxycycline (VIBRAMYCIN) IV     lactated ringers 150 mL/hr at 04/16/22 0708     LOS: 1 day    Time spent: 71mn    PDomenic Polite MD [facility]   04/16/2022, 8:44 AM

## 2022-04-16 NOTE — Progress Notes (Signed)
Spoke with patient's sister, Ms. Poppleton, regarding PT recommendation for HHPT. She is agreeable to service. Indicates that patient was ambulating well independently prior to illness onset. Attempted to arrange HHPT with [facility], [facility], [facility], [facility], [facility], and [facility], however no agency was able to accept due to [facility] Medicare. PT recommends RW. RW ordered via Caryl Pina with [facility].  Discussed with Ms. Poppleton inability to arrange [facility] services, offered OPPT. Ms. Lolita Patella states that she will wait until patient is closer to d/c befor deciding on OPPT as he may get back to his baseline.    Betania Dizon, Clydene Pugh, LCSW

## 2022-04-16 NOTE — ED Notes (Signed)
PT/OT at bedside.

## 2022-04-16 NOTE — Plan of Care (Signed)
  Problem: Acute Rehab PT Goals(only PT should resolve) Goal: Pt Will Go Supine/Side To Sit Outcome: Progressing Flowsheets (Taken 04/16/2022 1216) Pt will go Supine/Side to Sit:  Independently  with modified independence Goal: Patient Will Transfer Sit To/From Stand Outcome: Progressing Flowsheets (Taken 04/16/2022 1216) Patient will transfer sit to/from stand:  with modified independence  with supervision Goal: Pt Will Transfer Bed To Chair/Chair To Bed Outcome: Progressing Flowsheets (Taken 04/16/2022 1216) Pt will Transfer Bed to Chair/Chair to Bed: with supervision Goal: Pt Will Ambulate Outcome: Progressing Flowsheets (Taken 04/16/2022 1216) Pt will Ambulate:  100 feet  with min guard assist  with rolling walker   12:17 PM, 04/16/22 Lonell Grandchild, MPT Physical Therapist with [facility] [phone] office [phone] mobile phone

## 2022-04-16 NOTE — Progress Notes (Signed)
  Transition of Care [facility]) Screening Note   Patient Details  Name: UZAIR GODLEY Date of Birth: Nov 11, 1951   Transition of [facility]) CM/SW Contact:    Iona Beard, [place] Phone Number: 04/16/2022, 10:14 AM   Transition of Care Department [facility]) has reviewed patient and no TOC needs have been identified at this time. We will continue to monitor patient advancement through interdisciplinary progression rounds. If new patient transition needs arise, please place a TOC consult.

## 2022-04-16 NOTE — Progress Notes (Addendum)
Pharmacy Antibiotic Note  Henry [name] is a 71 y.o. male admitted on 04/15/2022 with aspiration pneumonia.  Pharmacy has been consulted for unasyn dosing.  Plan: Unasyn '3mg'$  IV q8h F/U cxs and clinical progress Monitor V/S, labs  Height: '5\' 11"'$  (180.3 cm) Weight: 90.1 kg (198 lb 10.2 oz) IBW/kg (Calculated) : 75.3  Temp (24hrs), Avg:100.9 F (38.3 C), Min:98.7 F (37.1 C), Max:104 F (40 C)  Recent Labs  Lab 04/15/22 1847 04/15/22 2019 04/16/22 0323  WBC 17.9*  --  16.3*  CREATININE 0.85  --  0.79  LATICACIDVEN 2.3* 2.7*  --     Estimated Creatinine Clearance: 88.9 mL/min (by C-G formula based on SCr of 0.79 mg/dL).    No Known Allergies  Antimicrobials this admission: Unasyn 5/18 >>   azithromycin 5/17> 5/18 Ceftriaxone 5/17> 5/18 Doxycyline 5/18> 5/18 Microbiology results: 5/17 BCx: ngtd 5/17 UCx: pending   Thank you for allowing pharmacy to be a part of this patient's care.  Isac Sarna, BS Pharm D, BCPS Clinical Pharmacist 04/16/2022 9:20 AM

## 2022-04-17 DIAGNOSIS — A419 Sepsis, unspecified organism: Secondary | ICD-10-CM | POA: Diagnosis not present

## 2022-04-17 LAB — LYME DISEASE SEROLOGY W/REFLEX: Lyme Total Antibody EIA: NEGATIVE

## 2022-04-17 LAB — BASIC METABOLIC PANEL
Anion gap: 7 (ref 5–15)
BUN: 23 mg/dL (ref 8–23)
CO2: 22 mmol/L (ref 22–32)
Calcium: 7.8 mg/dL — ABNORMAL LOW (ref 8.9–10.3)
Chloride: 108 mmol/L (ref 98–111)
Creatinine, Ser: 0.66 mg/dL (ref 0.61–1.24)
GFR, Estimated: >60 mL/min (ref >60)
Glucose, Bld: 105 mg/dL — ABNORMAL HIGH (ref 70–99)
Potassium: 3.3 mmol/L — ABNORMAL LOW (ref 3.5–5.1)
Sodium: 137 mmol/L (ref 135–145)

## 2022-04-17 LAB — CBC
HCT: 33.1 % — ABNORMAL LOW (ref 39.0–52.0)
Hemoglobin: 10.6 g/dL — ABNORMAL LOW (ref 13.0–17.0)
MCH: 28.8 pg (ref 26.0–34.0)
MCHC: 32 g/dL (ref 30.0–36.0)
MCV: 89.9 fL (ref 80.0–100.0)
Platelets: 171 10*3/uL (ref 150–400)
RBC: 3.68 MIL/uL — ABNORMAL LOW (ref 4.22–5.81)
RDW: 14.4 % (ref 11.5–15.5)
WBC: 9 10*3/uL (ref 4.0–10.5)
nRBC: 0 % (ref 0.0–0.2)

## 2022-04-17 LAB — GLUCOSE, CAPILLARY
Glucose-Capillary: 104 mg/dL — ABNORMAL HIGH (ref 70–99)
Glucose-Capillary: 163 mg/dL — ABNORMAL HIGH (ref 70–99)
Glucose-Capillary: 272 mg/dL — ABNORMAL HIGH (ref 70–99)
Glucose-Capillary: 71 mg/dL (ref 70–99)

## 2022-04-17 LAB — LEGIONELLA PNEUMOPHILA SEROGP 1 UR AG: L. pneumophila Serogp 1 Ur Ag: NEGATIVE

## 2022-04-17 LAB — URINE CULTURE: Culture: NO GROWTH

## 2022-04-17 MED ORDER — POTASSIUM CHLORIDE CRYS ER 20 MEQ PO TBCR
40.0000 meq | EXTENDED_RELEASE_TABLET | Freq: Once | ORAL | Status: AC
  Administered 2022-04-17: 40 meq via ORAL
  Filled 2022-04-17: qty 2

## 2022-04-17 MED ORDER — METOPROLOL TARTRATE 5 MG/5ML IV SOLN: 5.0000 mg | Freq: Four times a day (QID) | INTRAVENOUS | Status: DC | PRN

## 2022-04-17 NOTE — Progress Notes (Signed)
Patient assisted in standing at the bedside and washing his gums.

## 2022-04-17 NOTE — Progress Notes (Signed)
Dr. Manuella Ghazi notified of patients irregular heart rhythm as reported by telemetry. Vitals stable. Patient alert/oriented with no s/s distress.

## 2022-04-17 NOTE — Progress Notes (Signed)
PROGRESS NOTE    Henry [name]  CLE:[id number] DOB: 1951-02-27 DOA: 04/15/2022 PCP: Curlene Labrum, MD   Brief Narrative:    71/M with history of type 2 diabetes mellitus, esophageal dysphagia, GERD presented to the ED with generalized weakness, fall and fevers. -History of vomiting, known esophageal dysphagia, previously had strictures reported requiring dilation, subsequently found to have achalasia and, due to have a repeat endoscopy next month. -In the ED he was febrile to 104, tachycardic, tachypneic, mildly hypoxic, labs noted leukocytosis, lactic acidosis,, influenza and COVID PCR were negative, chest x-ray noted left retrocardiac opacity atelectasis versus infiltrate.  Patient admitted with sepsis, present on admission with aspiration pneumonia.  Assessment & Plan:   Principal Problem:   Sepsis due to undetermined organism [facility]) Active Problems:   GERD (gastroesophageal reflux disease)   Lactic acidosis   Hypokalemia   Uncontrolled type 2 diabetes mellitus with hyperglycemia, without long-term current use of insulin ([place])   Fall at home, initial encounter   Acute febrile illness  Assessment and Plan:   Sepsis, poa Suspected aspiration pneumonia -Known history of esophageal dysphagia -Continue IV Unasyn -Follow-up blood cultures, Supportive care   Acute hypoxic respiratory failure -Currently resolved   Esophageal dysphagia -Prior history of strictures requiring dilation and most recently noted to have dilated esophagus on EGD, suspected to have a chalazia -Continue PPI -Barium esophagram with concerns for achalasia/presbyesophagus.  Discussed with GI with plans to undergo endoscopy next month with manometry  Hypokalemia -Replaced -Follow a.m. labs   Recent tick exposure -No noticeable skin rash -Monitor clinically   Type 2 diabetes mellitus -Hold metformin, CBGs are stable, continue sliding scale insulin   DVT prophylaxis: Lovenox Code Status: Full  code Family Communication: Sister at bedside Disposition Plan: Home likely in 24 hours with home health     Consultants:  Discussed with GI Dr. Laural Golden  Procedures:  None  Antimicrobials:  Anti-infectives (From admission, onward)    Start     Dose/Rate Route Frequency Ordered Stop   04/16/22 1000  Ampicillin-Sulbactam (UNASYN) 3 g in sodium chloride 0.9 % 100 mL IVPB        3 g 200 mL/hr over 30 Minutes Intravenous Every 6 hours 04/16/22 0900     04/16/22 0800  doxycycline (VIBRAMYCIN) 100 mg in sodium chloride 0.9 % 250 mL IVPB  Status:  Discontinued        100 mg 125 mL/hr over 120 Minutes Intravenous Every 12 hours 04/16/22 0000 04/16/22 0853   04/15/22 1845  cefTRIAXone (ROCEPHIN) 2 g in sodium chloride 0.9 % 100 mL IVPB  Status:  Discontinued        2 g 200 mL/hr over 30 Minutes Intravenous Every 24 hours 04/15/22 1831 04/16/22 0853   04/15/22 1845  azithromycin (ZITHROMAX) 500 mg in sodium chloride 0.9 % 250 mL IVPB  Status:  Discontinued        500 mg 250 mL/hr over 60 Minutes Intravenous Every 24 hours 04/15/22 1831 04/16/22 0000   04/15/22 1845  doxycycline (VIBRA-TABS) tablet 100 mg        100 mg Oral  Once 04/15/22 1831 04/15/22 1856      Subjective: Patient seen and evaluated today and appears to be tolerating breakfast well.  His sister is at bedside and she is worried about his capacity to ambulate prior to discharge home.  No acute overnight events noted.  Objective: Vitals:   04/16/22 1710 04/16/22 2115 04/17/22 0124 04/17/22 0557  BP: (!) 112/91 114/67  114/71 107/71  Pulse: (!) 104 80 90 87  Resp: '20 20 17 16  '$ Temp: 98.2 F (36.8 C) 99.6 F (37.6 C) 98 F (36.7 C) 98.2 F (36.8 C)  TempSrc: Oral  Oral Oral  SpO2: 100% 96% 93% 93%  Weight:      Height:        Intake/Output Summary (Last 24 hours) at 04/17/2022 1355 Last data filed at 04/17/2022 0600 Gross per 24 hour  Intake 740.07 ml  Output --  Net 740.07 ml   Filed Weights   04/15/22 1819   Weight: 90.1 kg    Examination:  General exam: Appears calm and comfortable  Respiratory system: Clear to auscultation. Respiratory effort normal. Cardiovascular system: S1 & S2 heard, RRR.  Gastrointestinal system: Abdomen is soft Central nervous system: Alert and awake Extremities: No edema Skin: No significant lesions noted Psychiatry: Flat affect.    Data Reviewed: I have personally reviewed following labs and imaging studies  CBC: Recent Labs  Lab 04/15/22 1847 04/16/22 0323 04/17/22 0445  WBC 17.9* 16.3* 9.0  NEUTROABS 16.3*  --   --   HGB 13.6 11.9* 10.6*  HCT 42.1 34.9* 33.1*  MCV 90.7 90.9 89.9  PLT 200 171 732   Basic Metabolic Panel: Recent Labs  Lab 04/15/22 1847 04/16/22 0323 04/17/22 0445  NA 136 137 137  K 3.3* 3.6 3.3*  CL 98 107 108  CO2 '24 22 22  '$ GLUCOSE 199* 178* 105*  BUN 26* 21 23  CREATININE 0.85 0.79 0.66  CALCIUM 9.1 7.8* 7.8*  MG  --  1.7  --   PHOS  --  1.8*  --    GFR: Estimated Creatinine Clearance: 88.9 mL/min (by C-G formula based on SCr of 0.66 mg/dL). Liver Function Tests: Recent Labs  Lab 04/15/22 1847 04/16/22 0323  AST 44* 40  ALT 20 16  ALKPHOS 76 54  BILITOT 1.0 0.7  PROT 7.6 5.7*  ALBUMIN 3.8 2.7*   No results for input(s): LIPASE, AMYLASE in the last 168 hours. No results for input(s): AMMONIA in the last 168 hours. Coagulation Profile: Recent Labs  Lab 04/15/22 1847 04/16/22 0323  INR 1.2 1.3*   Cardiac Enzymes: No results for input(s): CKTOTAL, CKMB, CKMBINDEX, TROPONINI in the last 168 hours. BNP (last 3 results) No results for input(s): PROBNP in the last 8760 hours. HbA1C: Recent Labs    04/16/22 0323  HGBA1C 7.1*   CBG: Recent Labs  Lab 04/16/22 1119 04/16/22 1730 04/16/22 2122 04/17/22 0745 04/17/22 1157  GLUCAP 222* 75 112* 104* 163*   Lipid Profile: No results for input(s): CHOL, HDL, LDLCALC, TRIG, CHOLHDL, LDLDIRECT in the last 72 hours. Thyroid Function Tests: No results  for input(s): TSH, T4TOTAL, FREET4, T3FREE, THYROIDAB in the last 72 hours. Anemia Panel: No results for input(s): VITAMINB12, FOLATE, FERRITIN, TIBC, IRON, RETICCTPCT in the last 72 hours. Sepsis Labs: Recent Labs  Lab 04/15/22 1847 04/15/22 2019 04/16/22 0323  PROCALCITON  --   --  15.33  LATICACIDVEN 2.3* 2.7*  --     Recent Results (from the past 240 hour(s))  Blood Culture (routine x 2)     Status: None (Preliminary result)   Collection Time: 04/15/22  6:47 PM   Specimen: BLOOD RIGHT FOREARM  Result Value Ref Range Status   Specimen Description BLOOD RIGHT FOREARM  Final   Special Requests   Final    BOTTLES DRAWN AEROBIC AND ANAEROBIC Blood Culture adequate volume   Culture   Final  NO GROWTH 2 DAYS Performed at [facility], [address], [place], [place] [address]    Report Status PENDING  Incomplete  Blood Culture (routine x 2)     Status: None (Preliminary result)   Collection Time: 04/15/22  6:47 PM   Specimen: BLOOD LEFT FOREARM  Result Value Ref Range Status   Specimen Description BLOOD LEFT FOREARM  Final   Special Requests   Final    BOTTLES DRAWN AEROBIC AND ANAEROBIC Blood Culture results may not be optimal due to an excessive volume of blood received in culture bottles   Culture   Final    NO GROWTH 2 DAYS Performed at [facility], [address], [place], [place] [address]    Report Status PENDING  Incomplete  Resp Panel by RT-PCR (Flu A&B, Covid) Nasopharyngeal Swab     Status: None   Collection Time: 04/15/22  7:22 PM   Specimen: Nasopharyngeal Swab; Nasopharyngeal(NP) swabs in vial transport medium  Result Value Ref Range Status   SARS Coronavirus 2 by RT PCR NEGATIVE NEGATIVE Final    Comment: (NOTE) SARS-CoV-2 target nucleic acids are NOT DETECTED.  The SARS-CoV-2 RNA is generally detectable in upper respiratory specimens during the acute phase of infection. The lowest concentration of SARS-CoV-2 viral copies this assay can detect is 138  copies/mL. A negative result does not preclude SARS-Cov-2 infection and should not be used as the sole basis for treatment or other patient management decisions. A negative result may occur with  improper specimen collection/handling, submission of specimen other than nasopharyngeal swab, presence of viral mutation(s) within the areas targeted by this assay, and inadequate number of viral copies(<138 copies/mL). A negative result must be combined with clinical observations, patient history, and epidemiological information. The expected result is Negative.  Fact Sheet for Patients:  EntrepreneurPulse.com.au  Fact Sheet for Healthcare Providers:  IncredibleEmployment.be  This test is no t yet approved or cleared by the [place] FDA and  has been authorized for detection and/or diagnosis of SARS-CoV-2 by FDA under an Emergency Use Authorization (EUA). This EUA will remain  in effect (meaning this test can be used) for the duration of the COVID-19 declaration under Section 564(b)(1) of the Act, 21 U.S.C.section 360bbb-3(b)(1), unless the authorization is terminated  or revoked sooner.       Influenza A by PCR NEGATIVE NEGATIVE Final   Influenza B by PCR NEGATIVE NEGATIVE Final    Comment: (NOTE) The Xpert Xpress SARS-CoV-2/FLU/RSV plus assay is intended as an aid in the diagnosis of influenza from Nasopharyngeal swab specimens and should not be used as a sole basis for treatment. Nasal washings and aspirates are unacceptable for Xpert Xpress SARS-CoV-2/FLU/RSV testing.  Fact Sheet for Patients: EntrepreneurPulse.com.au  Fact Sheet for Healthcare Providers: IncredibleEmployment.be  This test is not yet approved or cleared by the [place] FDA and has been authorized for detection and/or diagnosis of SARS-CoV-2 by FDA under an Emergency Use Authorization (EUA). This EUA will remain in effect (meaning  this test can be used) for the duration of the COVID-19 declaration under Section 564(b)(1) of the Act, 21 U.S.C. section 360bbb-3(b)(1), unless the authorization is terminated or revoked.  Performed at [facility], [address], [place], [place] [address]   Urine Culture     Status: None   Collection Time: 04/15/22  9:44 PM   Specimen: Urine, Clean Catch  Result Value Ref Range Status   Specimen Description   Final    URINE, CLEAN CATCH Performed at  [facility], [address] [place], [place] [address]    Special Requests   Final    NONE Performed at [facility], [address] [place], [place] [address]    Culture   Final    NO GROWTH Performed at [facility], [place] [address], [place], [place] [address]    Report Status 04/17/2022 FINAL  Final  Expectorated Sputum Assessment w Gram Stain, Rflx to Resp Cult     Status: None   Collection Time: 04/15/22 11:54 PM   Specimen: Expectorated Sputum  Result Value Ref Range Status   Specimen Description EXPECTORATED SPUTUM  Final   Special Requests NONE  Final   Sputum evaluation   Final    THIS SPECIMEN IS ACCEPTABLE FOR SPUTUM CULTURE Performed at [facility], [address], [place], [place] [address]    Report Status 04/16/2022 FINAL  Final  Culture, Respiratory w Gram Stain     Status: None (Preliminary result)   Collection Time: 04/15/22 11:54 PM  Result Value Ref Range Status   Specimen Description   Final    EXPECTORATED SPUTUM Performed at [facility], [address] [place], [place] [address]    Special Requests   Final    NONE Reflexed from [id number] Performed at [facility], [address], [place], [place] [address]    Gram Stain NO WBC SEEN RARE [place]   Final   Culture   Final    CULTURE REINCUBATED FOR BETTER GROWTH Performed at [facility], [place] [address], [place], [place] [address]    Report Status PENDING  Incomplete         Radiology Studies: DG Chest Port 1  View  Result Date: 04/15/2022 CLINICAL DATA:  History of questionable sepsis EXAM: PORTABLE CHEST 1 VIEW COMPARISON:  None Available. FINDINGS: The cardiomediastinal silhouette is enlarged in contour. No pleural effusion. No pneumothorax. LEFT retrocardiac opacity, likely atelectasis. Visualized abdomen is unremarkable. Multilevel degenerative changes of the thoracic spine. IMPRESSION: LEFT retrocardiac opacity, favor atelectasis. Differential considerations include infection. Electronically Signed   By: Valentino Saxon M.D.   On: 04/15/2022 18:58   DG ESOPHAGUS W SINGLE CM (SOL OR THIN BA)  Result Date: 04/16/2022 CLINICAL DATA:  Dysphagia. EXAM: ESOPHOGRAM/BARIUM SWALLOW TECHNIQUE: Single contrast examination was performed using  thin barium. FLUOROSCOPY: Radiation Exposure Index (as provided by the fluoroscopic device): 71.7 mGy mGy Kerma COMPARISON:  December 11, 2021. FINDINGS: Mild tertiary contractions are noted suggesting presbyesophagus. There is severe persistent narrowing involving the gastroesophageal junction. Small amount of contrast was seen to passed through this area into the stomach. This is most consistent with achalasia as noted on prior exam. Barium tablet was not administered given this finding. IMPRESSION: Severe persistent narrowing of the gastroesophageal junction is again noted consistent with achalasia, as noted on prior exam of December 11, 2021. Mild tertiary contractions are noted suggesting presbyesophagus. Electronically Signed   By: Marijo Conception M.D.   On: 04/16/2022 10:28        Scheduled Meds:  dextromethorphan-guaiFENesin  1 tablet Oral BID   enoxaparin (LOVENOX) injection  40 mg Subcutaneous Q24H   insulin aspart  0-15 Units Subcutaneous TID WC   insulin aspart  0-5 Units Subcutaneous QHS   pantoprazole  40 mg Oral Daily   Continuous Infusions:  ampicillin-sulbactam (UNASYN) IV 3 g (04/17/22 0840)     LOS: 2 days    Time spent: 35  minutes    Quin Mcpherson Darleen Crocker,  DO [facility]  If 7PM-7AM, please contact night-coverage [email or web address] 04/17/2022, 1:55 PM

## 2022-04-17 NOTE — Plan of Care (Signed)
  Problem: Activity: Goal: Ability to tolerate increased activity will improve 04/17/2022 0942 by Deberah Castle, RN Outcome: Progressing 04/17/2022 0942 by Deberah Castle, RN Outcome: Progressing   Problem: Clinical Measurements: Goal: Ability to maintain a body temperature in the normal range will improve 04/17/2022 0942 by Deberah Castle, RN Outcome: Progressing 04/17/2022 0942 by Deberah Castle, RN Outcome: Progressing   Problem: Respiratory: Goal: Ability to maintain adequate ventilation will improve 04/17/2022 0942 by Deberah Castle, RN Outcome: Progressing 04/17/2022 0942 by Deberah Castle, RN Outcome: Progressing Goal: Ability to maintain a clear airway will improve 04/17/2022 0942 by Deberah Castle, RN Outcome: Progressing 04/17/2022 0942 by Deberah Castle, RN Outcome: Progressing

## 2022-04-17 NOTE — Care Management Important Message (Signed)
Important Message  Patient Details  Name: Henry [name] MRN: [id number] Date of Birth: 10-15-1951   Medicare Important Message Given:  Yes     Francisco Medal 04/17/2022, 1:03 PM

## 2022-04-18 LAB — BASIC METABOLIC PANEL
Anion gap: 8 (ref 5–15)
BUN: 17 mg/dL (ref 8–23)
CO2: 24 mmol/L (ref 22–32)
Calcium: 7.7 mg/dL — ABNORMAL LOW (ref 8.9–10.3)
Chloride: 106 mmol/L (ref 98–111)
Creatinine, Ser: 0.57 mg/dL — ABNORMAL LOW (ref 0.61–1.24)
GFR, Estimated: >60 mL/min (ref >60)
Glucose, Bld: 110 mg/dL — ABNORMAL HIGH (ref 70–99)
Potassium: 3.4 mmol/L — ABNORMAL LOW (ref 3.5–5.1)
Sodium: 138 mmol/L (ref 135–145)

## 2022-04-18 LAB — CULTURE, RESPIRATORY W GRAM STAIN
Culture: NORMAL
Gram Stain: NONE SEEN

## 2022-04-18 LAB — MAGNESIUM: Magnesium: 1.8 mg/dL (ref 1.7–2.4)

## 2022-04-18 LAB — CBC
HCT: 33.1 % — ABNORMAL LOW (ref 39.0–52.0)
Hemoglobin: 10.6 g/dL — ABNORMAL LOW (ref 13.0–17.0)
MCH: 28.8 pg (ref 26.0–34.0)
MCHC: 32 g/dL (ref 30.0–36.0)
MCV: 89.9 fL (ref 80.0–100.0)
Platelets: 168 10*3/uL (ref 150–400)
RBC: 3.68 MIL/uL — ABNORMAL LOW (ref 4.22–5.81)
RDW: 14.4 % (ref 11.5–15.5)
WBC: 6.6 10*3/uL (ref 4.0–10.5)
nRBC: 0 % (ref 0.0–0.2)

## 2022-04-18 LAB — GLUCOSE, CAPILLARY: Glucose-Capillary: 100 mg/dL — ABNORMAL HIGH (ref 70–99)

## 2022-04-18 MED ORDER — ALBUTEROL SULFATE HFA 108 (90 BASE) MCG/ACT IN AERS: 2.0000 | INHALATION_SPRAY | Freq: Four times a day (QID) | RESPIRATORY_TRACT | 2 refills | Status: DC | PRN

## 2022-04-18 MED ORDER — DM-GUAIFENESIN ER 30-600 MG PO TB12: 1.0000 | ORAL_TABLET | Freq: Two times a day (BID) | ORAL | 0 refills | Status: AC

## 2022-04-18 MED ORDER — AMOXICILLIN-POT CLAVULANATE 875-125 MG PO TABS: 1.0000 | ORAL_TABLET | Freq: Two times a day (BID) | ORAL | 0 refills | Status: AC

## 2022-04-18 MED ORDER — POTASSIUM CHLORIDE CRYS ER 20 MEQ PO TBCR
40.0000 meq | EXTENDED_RELEASE_TABLET | Freq: Once | ORAL | Status: AC
  Administered 2022-04-18: 40 meq via ORAL
  Filled 2022-04-18: qty 2

## 2022-04-18 NOTE — Progress Notes (Signed)
Pharmacy Antibiotic Note  Henry [name] is a 71 y.o. male admitted on 04/15/2022 with aspiration pneumonia.  Pharmacy has been consulted for unasyn dosing.  Plan: Continue Unasyn '3mg'$  IV q6h F/U cxs and clinical progress Monitor V/S, labs  Height: '5\' 11"'$  (180.3 cm) Weight: 90.1 kg (198 lb 10.2 oz) IBW/kg (Calculated) : 75.3  Temp (24hrs), Avg:98.3 F (36.8 C), Min:98.2 F (36.8 C), Max:98.4 F (36.9 C)  Recent Labs  Lab 04/15/22 1847 04/15/22 2019 04/16/22 0323 04/17/22 0445 04/18/22 0408  WBC 17.9*  --  16.3* 9.0 6.6  CREATININE 0.85  --  0.79 0.66 0.57*  LATICACIDVEN 2.3* 2.7*  --   --   --      Estimated Creatinine Clearance: 88.9 mL/min (A) (by C-G formula based on SCr of 0.57 mg/dL (L)).    No Known Allergies  Antimicrobials this admission: Unasyn 5/18 >>   azithromycin 5/17> 5/18 Ceftriaxone 5/17> 5/18 Doxycyline 5/18> 5/18 Microbiology results: 5/17 BCx: ngtd 5/17 UCx: pending   Thank you for allowing pharmacy to be a part of this patient's care.  Hart Robinsons, PharmD Clinical Pharmacist 04/18/2022 9:49 AM

## 2022-04-18 NOTE — Plan of Care (Signed)
  Problem: Activity: Goal: Ability to tolerate increased activity will improve 04/18/2022 1050 by Santa Lighter, RN Outcome: Adequate for Discharge 04/18/2022 1049 by Santa Lighter, RN Outcome: Progressing   Problem: Clinical Measurements: Goal: Ability to maintain a body temperature in the normal range will improve 04/18/2022 1050 by Santa Lighter, RN Outcome: Adequate for Discharge 04/18/2022 1049 by Santa Lighter, RN Outcome: Progressing   Problem: Respiratory: Goal: Ability to maintain adequate ventilation will improve 04/18/2022 1050 by Santa Lighter, RN Outcome: Adequate for Discharge 04/18/2022 1049 by Santa Lighter, RN Outcome: Progressing Goal: Ability to maintain a clear airway will improve 04/18/2022 1050 by Santa Lighter, RN Outcome: Adequate for Discharge 04/18/2022 1049 by Santa Lighter, RN Outcome: Progressing   Problem: Acute Rehab PT Goals(only PT should resolve) Goal: Pt Will Go Supine/Side To Sit Outcome: Adequate for Discharge Goal: Patient Will Transfer Sit To/From Stand Outcome: Adequate for Discharge Goal: Pt Will Transfer Bed To Chair/Chair To Bed Outcome: Adequate for Discharge Goal: Pt Will Ambulate Outcome: Adequate for Discharge

## 2022-04-18 NOTE — Progress Notes (Signed)
Nsg Discharge Note  Admit Date:  04/15/2022 Discharge date: 04/18/2022   Henry [name] to be D/C'd Home per MD order.  AVS completed.   Removed Ivs-CDI. Reviewed d/c paperwork with patient and sister. Answered all questions. Stable patient and belongings were wheeled to ED entrance where he was picked up by his sister.    Discharge Medication: Allergies as of 04/18/2022   No Known Allergies      Medication List     TAKE these medications    albuterol 108 (90 Base) MCG/ACT inhaler Commonly known as: VENTOLIN HFA Inhale 2 puffs into the lungs every 6 (six) hours as needed for wheezing or shortness of breath.   amoxicillin-clavulanate 875-125 MG tablet Commonly known as: AUGMENTIN Take 1 tablet by mouth 2 (two) times daily for 4 days.   cetirizine 10 MG tablet Commonly known as: ZYRTEC Take 10 mg by mouth daily.   dextromethorphan-guaiFENesin 30-600 MG 12hr tablet Commonly known as: MUCINEX DM Take 1 tablet by mouth 2 (two) times daily for 10 days.   diphenhydramine-acetaminophen 25-500 MG Tabs tablet Commonly known as: TYLENOL PM Take 1 tablet by mouth at bedtime as needed (pain/sleep).   metFORMIN 500 MG tablet Commonly known as: GLUCOPHAGE Take 500 mg by mouth 2 (two) times daily.   multivitamin with minerals tablet Take 1 tablet by mouth daily.   omeprazole 20 MG capsule Commonly known as: PRILOSEC Take 20 mg by mouth daily.               Durable Medical Equipment  (From admission, onward)           Start     Ordered   04/16/22 1225  For home use only DME Walker rolling  Once       Question Answer Comment  Walker: With 5 Inch Wheels   Patient needs a walker to treat with the following condition Sepsis due to undetermined organism ([place])      04/16/22 1226            Discharge Assessment: Vitals:   04/17/22 2103 04/18/22 0426  BP: (!) 138/93 118/74  Pulse: 89 85  Resp: 18 19  Temp: 98.4 F (36.9 C) 98.4 F (36.9 C)  SpO2: 100% 97%    Skin clean, dry and intact without evidence of skin break down, no evidence of skin tears noted. IV catheter discontinued intact. Site without signs and symptoms of complications - no redness or edema noted at insertion site, patient denies c/o pain - only slight tenderness at site.  Dressing with slight pressure applied.  D/c Instructions-Education: Discharge instructions given to patient/family with verbalized understanding. D/c education completed with patient/family including follow up instructions, medication list, d/c activities limitations if indicated, with other d/c instructions as indicated by MD - patient able to verbalize understanding, all questions fully answered. Patient instructed to return to ED, call 911, or call MD for any changes in condition.  Patient escorted via [place], and D/C home via private auto.  Santa Lighter, RN 04/18/2022 12:31 PM

## 2022-04-18 NOTE — Discharge Summary (Signed)
Physician Discharge Summary  Henry [name]:[id number] DOB: December 27, 1950 DOA: 04/15/2022  PCP: Henry Labrum, MD  Admit date: 04/15/2022  Discharge date: 04/18/2022  Admitted From:Home  Disposition:  Home  Recommendations for Outpatient Follow-up:  Follow up with PCP in 1-2 weeks Continue on Augmentin for 4 more days to complete course of treatment for suspected aspiration pneumonia Follow-up with GI as scheduled on 6/23 for EGD with manometry Continue other medications as noted below Inhaler provided as needed for any shortness of breath or wheezing  Home Health: None  Equipment/Devices: None  Discharge Condition:Stable  CODE STATUS: Full  Diet recommendation: Heart Healthy  Brief/Interim Summary: 71/M with history of type 2 diabetes mellitus, esophageal dysphagia, GERD presented to the ED with generalized weakness, fall and fevers. -History of vomiting, known esophageal dysphagia, previously had strictures reported requiring dilation, subsequently found to have achalasia and, due to have a repeat endoscopy next month. -In the ED he was febrile to 104, tachycardic, tachypneic, mildly hypoxic, labs noted leukocytosis, lactic acidosis,, influenza and COVID PCR were negative, chest x-ray noted left retrocardiac opacity atelectasis versus infiltrate.  Patient admitted with sepsis, present on admission with aspiration pneumonia.  He was treated with IV antibiotics, primarily Unasyn and has had improvement in his overall symptoms.  He is no longer having any sepsis physiology and is tolerating diet well with no concerns noted.  GI plans to follow-up outpatient for his esophageal dysphagia and he does have scheduled visit next month.  He has ambulated well with PT and will be set up for outpatient physical therapy services.  No other acute events noted.  Discharge Diagnoses:  Principal Problem:   Sepsis due to undetermined organism [facility]) Active Problems:   GERD (gastroesophageal  reflux disease)   Lactic acidosis   Hypokalemia   Uncontrolled type 2 diabetes mellitus with hyperglycemia, without long-term current use of insulin ([place])   Fall at home, initial encounter   Acute febrile illness  Principal discharge diagnosis: Sepsis, present on admission secondary to aspiration pneumonia.  Discharge Instructions  Discharge Instructions     Diet - low sodium heart healthy   Complete by: As directed    Increase activity slowly   Complete by: As directed       Allergies as of 04/18/2022   No Known Allergies      Medication List     TAKE these medications    albuterol 108 (90 Base) MCG/ACT inhaler Commonly known as: VENTOLIN HFA Inhale 2 puffs into the lungs every 6 (six) hours as needed for wheezing or shortness of breath.   amoxicillin-clavulanate 875-125 MG tablet Commonly known as: AUGMENTIN Take 1 tablet by mouth 2 (two) times daily for 4 days.   cetirizine 10 MG tablet Commonly known as: ZYRTEC Take 10 mg by mouth daily.   dextromethorphan-guaiFENesin 30-600 MG 12hr tablet Commonly known as: MUCINEX DM Take 1 tablet by mouth 2 (two) times daily for 10 days.   diphenhydramine-acetaminophen 25-500 MG Tabs tablet Commonly known as: TYLENOL PM Take 1 tablet by mouth at bedtime as needed (pain/sleep).   metFORMIN 500 MG tablet Commonly known as: GLUCOPHAGE Take 500 mg by mouth 2 (two) times daily.   multivitamin with minerals tablet Take 1 tablet by mouth daily.   omeprazole 20 MG capsule Commonly known as: PRILOSEC Take 20 mg by mouth daily.               Durable Medical Equipment  (From admission, onward)  Start     Ordered   04/16/22 1225  For home use only DME Walker rolling  Once       Question Answer Comment  Walker: With [place]   Patient needs a walker to treat with the following condition Sepsis due to undetermined organism ([place])      04/16/22 [place] Follow up.   Why: Rolling walker Contact information: [address] [place] [address] [phone]         Henry Labrum, MD. Schedule an appointment as soon as possible for a visit in 1 week(s).   Specialty: Family Medicine Contact information: [place] [address] [phone]                No Known Allergies  Consultations: None   Procedures/Studies: DG Chest Port 1 View  Result Date: 04/15/2022 CLINICAL DATA:  History of questionable sepsis EXAM: PORTABLE CHEST 1 VIEW COMPARISON:  None Available. FINDINGS: The cardiomediastinal silhouette is enlarged in contour. No pleural effusion. No pneumothorax. LEFT retrocardiac opacity, likely atelectasis. Visualized abdomen is unremarkable. Multilevel degenerative changes of the thoracic spine. IMPRESSION: LEFT retrocardiac opacity, favor atelectasis. Differential considerations include infection. Electronically Signed   By: Valentino Saxon M.D.   On: 04/15/2022 18:58   DG ESOPHAGUS W SINGLE CM (SOL OR THIN BA)  Result Date: 04/16/2022 CLINICAL DATA:  Dysphagia. EXAM: ESOPHOGRAM/BARIUM SWALLOW TECHNIQUE: Single contrast examination was performed using  thin barium. FLUOROSCOPY: Radiation Exposure Index (as provided by the fluoroscopic device): 71.7 mGy mGy Kerma COMPARISON:  December 11, 2021. FINDINGS: Mild tertiary contractions are noted suggesting presbyesophagus. There is severe persistent narrowing involving the gastroesophageal junction. Small amount of contrast was seen to passed through this area into the stomach. This is most consistent with achalasia as noted on prior exam. Barium tablet was not administered given this finding. IMPRESSION: Severe persistent narrowing of the gastroesophageal junction is again noted consistent with achalasia, as noted on prior exam of December 11, 2021. Mild tertiary contractions are noted suggesting presbyesophagus. Electronically  Signed   By: Marijo Conception M.D.   On: 04/16/2022 10:28     Discharge Exam: Vitals:   04/17/22 2103 04/18/22 0426  BP: (!) 138/93 118/74  Pulse: 89 85  Resp: 18 19  Temp: 98.4 F (36.9 C) 98.4 F (36.9 C)  SpO2: 100% 97%   Vitals:   04/17/22 0557 04/17/22 1426 04/17/22 2103 04/18/22 0426  BP: 107/71 117/80 (!) 138/93 118/74  Pulse: 87 81 89 85  Resp: '16 20 18 19  '$ Temp: 98.2 F (36.8 C) 98.2 F (36.8 C) 98.4 F (36.9 C) 98.4 F (36.9 C)  TempSrc: Oral Oral Oral Oral  SpO2: 93% 99% 100% 97%  Weight:      Height:        General: Pt is alert, awake, not in acute distress Cardiovascular: RRR, S1/S2 +, no rubs, no gallops Respiratory: CTA bilaterally, no wheezing, no rhonchi Abdominal: Soft, NT, ND, bowel sounds + Extremities: no edema, no cyanosis    The results of significant diagnostics from this hospitalization (including imaging, microbiology, ancillary and laboratory) are listed below for reference.     Microbiology: Recent Results (from the past 240 hour(s))  Blood Culture (routine x 2)     Status: None (Preliminary result)   Collection Time: 04/15/22  6:47 PM  Specimen: BLOOD RIGHT FOREARM  Result Value Ref Range Status   Specimen Description BLOOD RIGHT FOREARM  Final   Special Requests   Final    BOTTLES DRAWN AEROBIC AND ANAEROBIC Blood Culture adequate volume   Culture   Final    NO GROWTH 3 DAYS Performed at [facility], [address], [place], [place] [address]    Report Status PENDING  Incomplete  Blood Culture (routine x 2)     Status: None (Preliminary result)   Collection Time: 04/15/22  6:47 PM   Specimen: BLOOD LEFT FOREARM  Result Value Ref Range Status   Specimen Description BLOOD LEFT FOREARM  Final   Special Requests   Final    BOTTLES DRAWN AEROBIC AND ANAEROBIC Blood Culture results may not be optimal due to an excessive volume of blood received in culture bottles   Culture   Final    NO GROWTH 3 DAYS Performed at [facility], [address], [place], [place] [address]    Report Status PENDING  Incomplete  Resp Panel by RT-PCR (Flu A&B, Covid) Nasopharyngeal Swab     Status: None   Collection Time: 04/15/22  7:22 PM   Specimen: Nasopharyngeal Swab; Nasopharyngeal(NP) swabs in vial transport medium  Result Value Ref Range Status   SARS Coronavirus 2 by RT PCR NEGATIVE NEGATIVE Final    Comment: (NOTE) SARS-CoV-2 target nucleic acids are NOT DETECTED.  The SARS-CoV-2 RNA is generally detectable in upper respiratory specimens during the acute phase of infection. The lowest concentration of SARS-CoV-2 viral copies this assay can detect is 138 copies/mL. A negative result does not preclude SARS-Cov-2 infection and should not be used as the sole basis for treatment or other patient management decisions. A negative result may occur with  improper specimen collection/handling, submission of specimen other than nasopharyngeal swab, presence of viral mutation(s) within the areas targeted by this assay, and inadequate number of viral copies(<138 copies/mL). A negative result must be combined with clinical observations, patient history, and epidemiological information. The expected result is Negative.  Fact Sheet for Patients:  EntrepreneurPulse.com.au  Fact Sheet for Healthcare Providers:  IncredibleEmployment.be  This test is no t yet approved or cleared by the [place] FDA and  has been authorized for detection and/or diagnosis of SARS-CoV-2 by FDA under an Emergency Use Authorization (EUA). This EUA will remain  in effect (meaning this test can be used) for the duration of the COVID-19 declaration under Section 564(b)(1) of the Act, 21 U.S.C.section 360bbb-3(b)(1), unless the authorization is terminated  or revoked sooner.       Influenza A by PCR NEGATIVE NEGATIVE Final   Influenza B by PCR NEGATIVE NEGATIVE Final    Comment: (NOTE) The Xpert Xpress  SARS-CoV-2/FLU/RSV plus assay is intended as an aid in the diagnosis of influenza from Nasopharyngeal swab specimens and should not be used as a sole basis for treatment. Nasal washings and aspirates are unacceptable for Xpert Xpress SARS-CoV-2/FLU/RSV testing.  Fact Sheet for Patients: EntrepreneurPulse.com.au  Fact Sheet for Healthcare Providers: IncredibleEmployment.be  This test is not yet approved or cleared by the [place] FDA and has been authorized for detection and/or diagnosis of SARS-CoV-2 by FDA under an Emergency Use Authorization (EUA). This EUA will remain in effect (meaning this test can be used) for the duration of the COVID-19 declaration under Section 564(b)(1) of the Act, 21 U.S.C. section 360bbb-3(b)(1), unless the authorization is terminated or revoked.  Performed at [facility], [address] [place],  [place] [address]   Urine Culture     Status: None   Collection Time: 04/15/22  9:44 PM   Specimen: Urine, Clean Catch  Result Value Ref Range Status   Specimen Description   Final    URINE, CLEAN CATCH Performed at [facility], [address], [place], [place] [address]    Special Requests   Final    NONE Performed at [facility], [address] [place], [place] [address]    Culture   Final    NO GROWTH Performed at [facility], [place] [address] [place], [place] [address]    Report Status 04/17/2022 FINAL  Final  Expectorated Sputum Assessment w Gram Stain, Rflx to Resp Cult     Status: None   Collection Time: 04/15/22 11:54 PM   Specimen: Expectorated Sputum  Result Value Ref Range Status   Specimen Description EXPECTORATED SPUTUM  Final   Special Requests NONE  Final   Sputum evaluation   Final    THIS SPECIMEN IS ACCEPTABLE FOR SPUTUM CULTURE Performed at [facility], [address], [place], [place] [address]    Report Status 04/16/2022 FINAL  Final  Culture, Respiratory w Gram Stain      Status: None (Preliminary result)   Collection Time: 04/15/22 11:54 PM  Result Value Ref Range Status   Specimen Description   Final    EXPECTORATED SPUTUM Performed at [facility], [address] [place], [place] [address]    Special Requests   Final    NONE Reflexed from [id number] Performed at [facility], [address], [place], [place] [address]    Gram Stain NO WBC SEEN RARE [place]   Final   Culture   Final    CULTURE REINCUBATED FOR BETTER GROWTH Performed at [facility], [place] [address] [place], [place] [address]    Report Status PENDING  Incomplete     Labs: BNP (last 3 results) No results for input(s): BNP in the last 8760 hours. Basic Metabolic Panel: Recent Labs  Lab 04/15/22 1847 04/16/22 0323 04/17/22 0445 04/18/22 0408  NA 136 137 137 138  K 3.3* 3.6 3.3* 3.4*  CL 98 107 108 106  CO2 '24 22 22 24  '$ GLUCOSE 199* 178* 105* 110*  BUN 26* '21 23 17  '$ CREATININE 0.85 0.79 0.66 0.57*  CALCIUM 9.1 7.8* 7.8* 7.7*  MG  --  1.7  --  1.8  PHOS  --  1.8*  --   --    Liver Function Tests: Recent Labs  Lab 04/15/22 1847 04/16/22 0323  AST 44* 40  ALT 20 16  ALKPHOS 76 54  BILITOT 1.0 0.7  PROT 7.6 5.7*  ALBUMIN 3.8 2.7*   No results for input(s): LIPASE, AMYLASE in the last 168 hours. No results for input(s): AMMONIA in the last 168 hours. CBC: Recent Labs  Lab 04/15/22 1847 04/16/22 0323 04/17/22 0445 04/18/22 0408  WBC 17.9* 16.3* 9.0 6.6  NEUTROABS 16.3*  --   --   --   HGB 13.6 11.9* 10.6* 10.6*  HCT 42.1 34.9* 33.1* 33.1*  MCV 90.7 90.9 89.9 89.9  PLT 200 171 171 168   Cardiac Enzymes: No results for input(s): CKTOTAL, CKMB, CKMBINDEX, TROPONINI in the last 168 hours. BNP: Invalid input(s): POCBNP CBG: Recent Labs  Lab 04/17/22 0745 04/17/22 1157 04/17/22 1645 04/17/22 2110 04/18/22 0747  GLUCAP 104* 163* 272* 71 100*   D-Dimer No results for input(s): DDIMER in the last  72 hours. Hgb A1c Recent Labs     04/16/22 0323  HGBA1C 7.1*   Lipid Profile No results for input(s): CHOL, HDL, LDLCALC, TRIG, CHOLHDL, LDLDIRECT in the last 72 hours. Thyroid function studies No results for input(s): TSH, T4TOTAL, T3FREE, THYROIDAB in the last 72 hours.  Invalid input(s): FREET3 Anemia work up No results for input(s): VITAMINB12, FOLATE, FERRITIN, TIBC, IRON, RETICCTPCT in the last 72 hours. Urinalysis    Component Value Date/Time   COLORURINE YELLOW 04/15/2022 2144   APPEARANCEUR HAZY (A) 04/15/2022 2144   LABSPEC 1.018 04/15/2022 2144   PHURINE 5.0 04/15/2022 2144   GLUCOSEU NEGATIVE 04/15/2022 2144   HGBUR SMALL (A) 04/15/2022 2144   BILIRUBINUR NEGATIVE 04/15/2022 2144   KETONESUR 5 (A) 04/15/2022 2144   PROTEINUR 30 (A) 04/15/2022 2144   NITRITE NEGATIVE 04/15/2022 2144   LEUKOCYTESUR NEGATIVE 04/15/2022 2144   Sepsis Labs Invalid input(s): PROCALCITONIN,  WBC,  LACTICIDVEN Microbiology Recent Results (from the past 240 hour(s))  Blood Culture (routine x 2)     Status: None (Preliminary result)   Collection Time: 04/15/22  6:47 PM   Specimen: BLOOD RIGHT FOREARM  Result Value Ref Range Status   Specimen Description BLOOD RIGHT FOREARM  Final   Special Requests   Final    BOTTLES DRAWN AEROBIC AND ANAEROBIC Blood Culture adequate volume   Culture   Final    NO GROWTH 3 DAYS Performed at [facility], [address], [place], [place] [address]    Report Status PENDING  Incomplete  Blood Culture (routine x 2)     Status: None (Preliminary result)   Collection Time: 04/15/22  6:47 PM   Specimen: BLOOD LEFT FOREARM  Result Value Ref Range Status   Specimen Description BLOOD LEFT FOREARM  Final   Special Requests   Final    BOTTLES DRAWN AEROBIC AND ANAEROBIC Blood Culture results may not be optimal due to an excessive volume of blood received in culture bottles   Culture   Final    NO GROWTH 3 DAYS Performed at [facility], [address] [place], [place] [address]    Report  Status PENDING  Incomplete  Resp Panel by RT-PCR (Flu A&B, Covid) Nasopharyngeal Swab     Status: None   Collection Time: 04/15/22  7:22 PM   Specimen: Nasopharyngeal Swab; Nasopharyngeal(NP) swabs in vial transport medium  Result Value Ref Range Status   SARS Coronavirus 2 by RT PCR NEGATIVE NEGATIVE Final    Comment: (NOTE) SARS-CoV-2 target nucleic acids are NOT DETECTED.  The SARS-CoV-2 RNA is generally detectable in upper respiratory specimens during the acute phase of infection. The lowest concentration of SARS-CoV-2 viral copies this assay can detect is 138 copies/mL. A negative result does not preclude SARS-Cov-2 infection and should not be used as the sole basis for treatment or other patient management decisions. A negative result may occur with  improper specimen collection/handling, submission of specimen other than nasopharyngeal swab, presence of viral mutation(s) within the areas targeted by this assay, and inadequate number of viral copies(<138 copies/mL). A negative result must be combined with clinical observations, patient history, and epidemiological information. The expected result is Negative.  Fact Sheet for Patients:  EntrepreneurPulse.com.au  Fact Sheet for Healthcare Providers:  IncredibleEmployment.be  This test is no t yet approved or cleared by the [place] FDA and  has been authorized for detection and/or diagnosis of SARS-CoV-2 by FDA under an Emergency Use Authorization (EUA). This EUA will remain  in effect (  meaning this test can be used) for the duration of the COVID-19 declaration under Section 564(b)(1) of the Act, 21 U.S.C.section 360bbb-3(b)(1), unless the authorization is terminated  or revoked sooner.       Influenza A by PCR NEGATIVE NEGATIVE Final   Influenza B by PCR NEGATIVE NEGATIVE Final    Comment: (NOTE) The Xpert Xpress SARS-CoV-2/FLU/RSV plus assay is intended as an aid in the diagnosis  of influenza from Nasopharyngeal swab specimens and should not be used as a sole basis for treatment. Nasal washings and aspirates are unacceptable for Xpert Xpress SARS-CoV-2/FLU/RSV testing.  Fact Sheet for Patients: EntrepreneurPulse.com.au  Fact Sheet for Healthcare Providers: IncredibleEmployment.be  This test is not yet approved or cleared by the [place] FDA and has been authorized for detection and/or diagnosis of SARS-CoV-2 by FDA under an Emergency Use Authorization (EUA). This EUA will remain in effect (meaning this test can be used) for the duration of the COVID-19 declaration under Section 564(b)(1) of the Act, 21 U.S.C. section 360bbb-3(b)(1), unless the authorization is terminated or revoked.  Performed at [facility], [address], [place], [place] [address]   Urine Culture     Status: None   Collection Time: 04/15/22  9:44 PM   Specimen: Urine, Clean Catch  Result Value Ref Range Status   Specimen Description   Final    URINE, CLEAN CATCH Performed at [facility], [address] [place], [place] [address]    Special Requests   Final    NONE Performed at [facility], [address], [place], [place] [address]    Culture   Final    NO GROWTH Performed at [facility], [place] [address] [place], [place] [address]    Report Status 04/17/2022 FINAL  Final  Expectorated Sputum Assessment w Gram Stain, Rflx to Resp Cult     Status: None   Collection Time: 04/15/22 11:54 PM   Specimen: Expectorated Sputum  Result Value Ref Range Status   Specimen Description EXPECTORATED SPUTUM  Final   Special Requests NONE  Final   Sputum evaluation   Final    THIS SPECIMEN IS ACCEPTABLE FOR SPUTUM CULTURE Performed at [facility], [address], [place], [place] [address]    Report Status 04/16/2022 FINAL  Final  Culture, Respiratory w Gram Stain     Status: None (Preliminary result)   Collection Time: 04/15/22 11:54 PM   Result Value Ref Range Status   Specimen Description   Final    EXPECTORATED SPUTUM Performed at [facility], [address] [place], [place] [address]    Special Requests   Final    NONE Reflexed from [id number] Performed at [facility], [address], [place], [place] [address]    Gram Stain NO WBC SEEN RARE [place]   Final   Culture   Final    CULTURE REINCUBATED FOR BETTER GROWTH Performed at [facility], [place] [address], [place],  [address]    Report Status PENDING  Incomplete     Time coordinating discharge: 35 minutes  SIGNED:   Rodena Goldmann, DO [facility] 04/18/2022, 10:05 AM  If 7PM-7AM, please contact night-coverage [email or web address]

## 2022-04-18 NOTE — Plan of Care (Signed)
°  Problem: Activity: °Goal: Ability to tolerate increased activity will improve °Outcome: Progressing °  °Problem: Respiratory: °Goal: Ability to maintain adequate ventilation will improve °Outcome: Progressing °Goal: Ability to maintain a clear airway will improve °Outcome: Progressing °  °

## 2022-04-20 LAB — CULTURE, BLOOD (ROUTINE X 2)
Culture: NO GROWTH
Culture: NO GROWTH
Special Requests: ADEQUATE

## 2022-04-20 LAB — ROCKY MTN SPOTTED FVR ABS PNL(IGG+IGM)
RMSF IgG: POSITIVE — AB
RMSF IgM: 0.7 index (ref 0.00–0.89)

## 2022-04-20 LAB — RMSF, IGG, IFA: RMSF, IGG, IFA: 1:256 {titer} — ABNORMAL HIGH

## 2022-05-18 DIAGNOSIS — A419 Sepsis, unspecified organism: Secondary | ICD-10-CM | POA: Diagnosis not present

## 2022-05-18 DIAGNOSIS — A770 Spotted fever due to Rickettsia rickettsii: Secondary | ICD-10-CM | POA: Diagnosis not present

## 2022-05-18 DIAGNOSIS — K219 Gastro-esophageal reflux disease without esophagitis: Secondary | ICD-10-CM | POA: Diagnosis not present

## 2022-05-18 DIAGNOSIS — J690 Pneumonitis due to inhalation of food and vomit: Secondary | ICD-10-CM | POA: Diagnosis not present

## 2022-05-18 DIAGNOSIS — H9190 Unspecified hearing loss, unspecified ear: Secondary | ICD-10-CM | POA: Diagnosis not present

## 2022-05-18 DIAGNOSIS — E1165 Type 2 diabetes mellitus with hyperglycemia: Secondary | ICD-10-CM | POA: Diagnosis not present

## 2022-05-27 ENCOUNTER — Ambulatory Visit (HOSPITAL_COMMUNITY)
Admission: RE | Admit: 2022-05-27 | Discharge: 2022-05-27 | Disposition: A | Discharge status: Home / Self Care | Payer: Medicare Other | Attending: Gastroenterology | Admitting: Gastroenterology

## 2022-05-27 ENCOUNTER — Encounter (HOSPITAL_COMMUNITY)
Admission: RE | Disposition: A | Discharge status: Home / Self Care | Payer: Self-pay | Source: Home / Self Care | Attending: Gastroenterology

## 2022-05-27 DIAGNOSIS — E119 Type 2 diabetes mellitus without complications: Secondary | ICD-10-CM | POA: Diagnosis not present

## 2022-05-27 DIAGNOSIS — R1310 Dysphagia, unspecified: Secondary | ICD-10-CM | POA: Insufficient documentation

## 2022-05-27 DIAGNOSIS — Z5309 Procedure and treatment not carried out because of other contraindication: Secondary | ICD-10-CM | POA: Diagnosis not present

## 2022-05-27 LAB — GLUCOSE, CAPILLARY: Glucose-Capillary: 106 mg/dL — ABNORMAL HIGH (ref 70–99)

## 2022-05-27 SURGERY — INVASIVE LAB ABORTED CASE

## 2022-05-27 MED ORDER — LIDOCAINE VISCOUS HCL 2 % MT SOLN
OROMUCOSAL | Status: AC
  Filled 2022-05-27: qty 15

## 2022-05-27 SURGICAL SUPPLY — 2 items
FACESHIELD LNG OPTICON STERILE (SAFETY) IMPLANT
GLOVE BIO SURGEON STRL SZ8 (GLOVE) ×6 IMPLANT

## 2022-05-27 NOTE — Progress Notes (Signed)
Attempted to do esophageal manometry on patient. Sister at bedside during the procedure to help comfort patient as she is his caregiver. Second RN at bedside during attempted manometry to provide support. Pt was just too anxious unable to fully come down during attempted insertion. Patient unable to tolerate the probe past nasal area. Stopped and aborted procedure. Sister at bedside and agrees he cannot tolerate.  Pt sitting on side of bed drinking sprite and states he feels a little woozy. Pt starts to stare off ahead. Pt getting sweaty. He does not loose consciousness. We put his feet up in stretcher. Pt states he was just feeling light headed. CBG checked as pt is diabetic and had not eaten since early. CBG-106. VS per chart are normal. Dr. Silverio Decamp to bedside and talked with patient. Feel he may have just gotten light headed due to stress of attempting manometry. Patient given graham crackers and peanut butter and sipping sprite. He felt better and then was discharged in wheelchair to front with family.

## 2022-06-23 NOTE — Progress Notes (Signed)
Indication for procedure was dysphagia.  Thanks

## 2022-06-29 DIAGNOSIS — A770 Spotted fever due to Rickettsia rickettsii: Secondary | ICD-10-CM | POA: Diagnosis not present

## 2022-06-29 DIAGNOSIS — R2681 Unsteadiness on feet: Secondary | ICD-10-CM | POA: Diagnosis not present

## 2022-06-29 DIAGNOSIS — E1165 Type 2 diabetes mellitus with hyperglycemia: Secondary | ICD-10-CM | POA: Diagnosis not present

## 2022-07-06 DIAGNOSIS — R269 Unspecified abnormalities of gait and mobility: Secondary | ICD-10-CM | POA: Diagnosis not present

## 2022-07-06 DIAGNOSIS — R2681 Unsteadiness on feet: Secondary | ICD-10-CM | POA: Diagnosis not present

## 2022-07-06 DIAGNOSIS — E1165 Type 2 diabetes mellitus with hyperglycemia: Secondary | ICD-10-CM | POA: Diagnosis not present

## 2022-07-06 DIAGNOSIS — K219 Gastro-esophageal reflux disease without esophagitis: Secondary | ICD-10-CM | POA: Diagnosis not present

## 2022-07-06 DIAGNOSIS — A770 Spotted fever due to Rickettsia rickettsii: Secondary | ICD-10-CM | POA: Diagnosis not present

## 2022-07-06 DIAGNOSIS — Z7984 Long term (current) use of oral hypoglycemic drugs: Secondary | ICD-10-CM | POA: Diagnosis not present

## 2022-07-06 DIAGNOSIS — Z8616 Personal history of COVID-19: Secondary | ICD-10-CM | POA: Diagnosis not present

## 2022-07-07 DIAGNOSIS — R2681 Unsteadiness on feet: Secondary | ICD-10-CM | POA: Diagnosis not present

## 2022-07-07 DIAGNOSIS — G319 Degenerative disease of nervous system, unspecified: Secondary | ICD-10-CM | POA: Diagnosis not present

## 2022-07-17 DIAGNOSIS — R269 Unspecified abnormalities of gait and mobility: Secondary | ICD-10-CM | POA: Diagnosis not present

## 2022-07-17 DIAGNOSIS — K219 Gastro-esophageal reflux disease without esophagitis: Secondary | ICD-10-CM | POA: Diagnosis not present

## 2022-07-17 DIAGNOSIS — E1165 Type 2 diabetes mellitus with hyperglycemia: Secondary | ICD-10-CM | POA: Diagnosis not present

## 2022-07-17 DIAGNOSIS — Z8616 Personal history of COVID-19: Secondary | ICD-10-CM | POA: Diagnosis not present

## 2022-07-17 DIAGNOSIS — A770 Spotted fever due to Rickettsia rickettsii: Secondary | ICD-10-CM | POA: Diagnosis not present

## 2022-07-17 DIAGNOSIS — R2681 Unsteadiness on feet: Secondary | ICD-10-CM | POA: Diagnosis not present

## 2022-07-17 DIAGNOSIS — Z7984 Long term (current) use of oral hypoglycemic drugs: Secondary | ICD-10-CM | POA: Diagnosis not present

## 2022-07-24 DIAGNOSIS — Z7984 Long term (current) use of oral hypoglycemic drugs: Secondary | ICD-10-CM | POA: Diagnosis not present

## 2022-07-24 DIAGNOSIS — K219 Gastro-esophageal reflux disease without esophagitis: Secondary | ICD-10-CM | POA: Diagnosis not present

## 2022-07-24 DIAGNOSIS — R2681 Unsteadiness on feet: Secondary | ICD-10-CM | POA: Diagnosis not present

## 2022-07-24 DIAGNOSIS — E1165 Type 2 diabetes mellitus with hyperglycemia: Secondary | ICD-10-CM | POA: Diagnosis not present

## 2022-07-24 DIAGNOSIS — A770 Spotted fever due to Rickettsia rickettsii: Secondary | ICD-10-CM | POA: Diagnosis not present

## 2022-07-24 DIAGNOSIS — R269 Unspecified abnormalities of gait and mobility: Secondary | ICD-10-CM | POA: Diagnosis not present

## 2022-07-24 DIAGNOSIS — Z8616 Personal history of COVID-19: Secondary | ICD-10-CM | POA: Diagnosis not present

## 2022-07-31 DIAGNOSIS — E1165 Type 2 diabetes mellitus with hyperglycemia: Secondary | ICD-10-CM | POA: Diagnosis not present

## 2022-07-31 DIAGNOSIS — R269 Unspecified abnormalities of gait and mobility: Secondary | ICD-10-CM | POA: Diagnosis not present

## 2022-07-31 DIAGNOSIS — Z8616 Personal history of COVID-19: Secondary | ICD-10-CM | POA: Diagnosis not present

## 2022-07-31 DIAGNOSIS — Z7984 Long term (current) use of oral hypoglycemic drugs: Secondary | ICD-10-CM | POA: Diagnosis not present

## 2022-07-31 DIAGNOSIS — A770 Spotted fever due to Rickettsia rickettsii: Secondary | ICD-10-CM | POA: Diagnosis not present

## 2022-07-31 DIAGNOSIS — K219 Gastro-esophageal reflux disease without esophagitis: Secondary | ICD-10-CM | POA: Diagnosis not present

## 2022-07-31 DIAGNOSIS — R2681 Unsteadiness on feet: Secondary | ICD-10-CM | POA: Diagnosis not present

## 2022-08-12 DIAGNOSIS — R5382 Chronic fatigue, unspecified: Secondary | ICD-10-CM | POA: Diagnosis not present

## 2022-08-12 DIAGNOSIS — G510 Bell's palsy: Secondary | ICD-10-CM | POA: Diagnosis not present

## 2022-08-12 DIAGNOSIS — K219 Gastro-esophageal reflux disease without esophagitis: Secondary | ICD-10-CM | POA: Diagnosis not present

## 2022-08-12 DIAGNOSIS — Z1329 Encounter for screening for other suspected endocrine disorder: Secondary | ICD-10-CM | POA: Diagnosis not present

## 2022-08-12 DIAGNOSIS — Z13220 Encounter for screening for lipoid disorders: Secondary | ICD-10-CM | POA: Diagnosis not present

## 2022-08-12 DIAGNOSIS — E1165 Type 2 diabetes mellitus with hyperglycemia: Secondary | ICD-10-CM | POA: Diagnosis not present

## 2022-08-18 DIAGNOSIS — Z23 Encounter for immunization: Secondary | ICD-10-CM | POA: Diagnosis not present

## 2022-08-18 DIAGNOSIS — E1165 Type 2 diabetes mellitus with hyperglycemia: Secondary | ICD-10-CM | POA: Diagnosis not present

## 2022-08-18 DIAGNOSIS — R2681 Unsteadiness on feet: Secondary | ICD-10-CM | POA: Diagnosis not present

## 2022-08-18 DIAGNOSIS — R030 Elevated blood-pressure reading, without diagnosis of hypertension: Secondary | ICD-10-CM | POA: Diagnosis not present

## 2022-08-18 DIAGNOSIS — Z0001 Encounter for general adult medical examination with abnormal findings: Secondary | ICD-10-CM | POA: Diagnosis not present

## 2022-08-26 DIAGNOSIS — E1165 Type 2 diabetes mellitus with hyperglycemia: Secondary | ICD-10-CM | POA: Diagnosis not present

## 2022-08-26 DIAGNOSIS — R269 Unspecified abnormalities of gait and mobility: Secondary | ICD-10-CM | POA: Diagnosis not present

## 2022-08-26 DIAGNOSIS — Z8616 Personal history of COVID-19: Secondary | ICD-10-CM | POA: Diagnosis not present

## 2022-08-26 DIAGNOSIS — K219 Gastro-esophageal reflux disease without esophagitis: Secondary | ICD-10-CM | POA: Diagnosis not present

## 2022-08-26 DIAGNOSIS — Z7984 Long term (current) use of oral hypoglycemic drugs: Secondary | ICD-10-CM | POA: Diagnosis not present

## 2022-08-26 DIAGNOSIS — R2681 Unsteadiness on feet: Secondary | ICD-10-CM | POA: Diagnosis not present

## 2022-08-26 DIAGNOSIS — A770 Spotted fever due to Rickettsia rickettsii: Secondary | ICD-10-CM | POA: Diagnosis not present

## 2022-08-31 DIAGNOSIS — A770 Spotted fever due to Rickettsia rickettsii: Secondary | ICD-10-CM | POA: Diagnosis not present

## 2022-08-31 DIAGNOSIS — R2681 Unsteadiness on feet: Secondary | ICD-10-CM | POA: Diagnosis not present

## 2022-08-31 DIAGNOSIS — E1165 Type 2 diabetes mellitus with hyperglycemia: Secondary | ICD-10-CM | POA: Diagnosis not present

## 2022-08-31 DIAGNOSIS — R269 Unspecified abnormalities of gait and mobility: Secondary | ICD-10-CM | POA: Diagnosis not present

## 2022-08-31 DIAGNOSIS — Z8616 Personal history of COVID-19: Secondary | ICD-10-CM | POA: Diagnosis not present

## 2022-08-31 DIAGNOSIS — K219 Gastro-esophageal reflux disease without esophagitis: Secondary | ICD-10-CM | POA: Diagnosis not present

## 2022-08-31 DIAGNOSIS — Z7984 Long term (current) use of oral hypoglycemic drugs: Secondary | ICD-10-CM | POA: Diagnosis not present

## 2022-10-05 DIAGNOSIS — I739 Peripheral vascular disease, unspecified: Secondary | ICD-10-CM | POA: Diagnosis not present

## 2022-10-05 DIAGNOSIS — M79675 Pain in left toe(s): Secondary | ICD-10-CM | POA: Diagnosis not present

## 2022-10-05 DIAGNOSIS — M79672 Pain in left foot: Secondary | ICD-10-CM | POA: Diagnosis not present

## 2022-10-05 DIAGNOSIS — M79671 Pain in right foot: Secondary | ICD-10-CM | POA: Diagnosis not present

## 2022-10-05 DIAGNOSIS — M79674 Pain in right toe(s): Secondary | ICD-10-CM | POA: Diagnosis not present

## 2022-10-05 DIAGNOSIS — L110 Acquired keratosis follicularis: Secondary | ICD-10-CM | POA: Diagnosis not present

## 2022-10-05 DIAGNOSIS — E1140 Type 2 diabetes mellitus with diabetic neuropathy, unspecified: Secondary | ICD-10-CM | POA: Diagnosis not present

## 2022-11-25 ENCOUNTER — Inpatient Hospital Stay (HOSPITAL_COMMUNITY)
Admission: EM | Admit: 2022-11-25 | Discharge: 2022-12-05 | DRG: 871 | Disposition: A | Discharge status: Home Health | Payer: Medicare Other | Attending: Family Medicine | Admitting: Family Medicine

## 2022-11-25 ENCOUNTER — Other Ambulatory Visit: Payer: Self-pay

## 2022-11-25 ENCOUNTER — Emergency Department (HOSPITAL_COMMUNITY): Payer: Medicare Other

## 2022-11-25 ENCOUNTER — Encounter (HOSPITAL_COMMUNITY): Payer: Self-pay

## 2022-11-25 DIAGNOSIS — R6520 Severe sepsis without septic shock: Secondary | ICD-10-CM | POA: Diagnosis present

## 2022-11-25 DIAGNOSIS — E876 Hypokalemia: Secondary | ICD-10-CM | POA: Diagnosis not present

## 2022-11-25 DIAGNOSIS — R4182 Altered mental status, unspecified: Principal | ICD-10-CM

## 2022-11-25 DIAGNOSIS — F0390 Unspecified dementia without behavioral disturbance: Secondary | ICD-10-CM | POA: Diagnosis present

## 2022-11-25 DIAGNOSIS — J101 Influenza due to other identified influenza virus with other respiratory manifestations: Secondary | ICD-10-CM | POA: Diagnosis not present

## 2022-11-25 DIAGNOSIS — K219 Gastro-esophageal reflux disease without esophagitis: Secondary | ICD-10-CM | POA: Diagnosis not present

## 2022-11-25 DIAGNOSIS — R7881 Bacteremia: Secondary | ICD-10-CM | POA: Insufficient documentation

## 2022-11-25 DIAGNOSIS — Z8701 Personal history of pneumonia (recurrent): Secondary | ICD-10-CM

## 2022-11-25 DIAGNOSIS — K2970 Gastritis, unspecified, without bleeding: Secondary | ICD-10-CM | POA: Diagnosis present

## 2022-11-25 DIAGNOSIS — K220 Achalasia of cardia: Secondary | ICD-10-CM | POA: Diagnosis not present

## 2022-11-25 DIAGNOSIS — K2289 Other specified disease of esophagus: Secondary | ICD-10-CM | POA: Diagnosis present

## 2022-11-25 DIAGNOSIS — H9193 Unspecified hearing loss, bilateral: Secondary | ICD-10-CM | POA: Diagnosis not present

## 2022-11-25 DIAGNOSIS — A4189 Other specified sepsis: Secondary | ICD-10-CM | POA: Diagnosis not present

## 2022-11-25 DIAGNOSIS — Z7984 Long term (current) use of oral hypoglycemic drugs: Secondary | ICD-10-CM

## 2022-11-25 DIAGNOSIS — J9601 Acute respiratory failure with hypoxia: Secondary | ICD-10-CM | POA: Diagnosis present

## 2022-11-25 DIAGNOSIS — R1314 Dysphagia, pharyngoesophageal phase: Secondary | ICD-10-CM | POA: Diagnosis not present

## 2022-11-25 DIAGNOSIS — Z538 Procedure and treatment not carried out for other reasons: Secondary | ICD-10-CM | POA: Diagnosis not present

## 2022-11-25 DIAGNOSIS — R0602 Shortness of breath: Secondary | ICD-10-CM | POA: Diagnosis not present

## 2022-11-25 DIAGNOSIS — G9341 Metabolic encephalopathy: Secondary | ICD-10-CM | POA: Diagnosis not present

## 2022-11-25 DIAGNOSIS — E1165 Type 2 diabetes mellitus with hyperglycemia: Secondary | ICD-10-CM | POA: Diagnosis not present

## 2022-11-25 DIAGNOSIS — J690 Pneumonitis due to inhalation of food and vomit: Secondary | ICD-10-CM | POA: Diagnosis not present

## 2022-11-25 DIAGNOSIS — R000 Tachycardia, unspecified: Secondary | ICD-10-CM | POA: Diagnosis not present

## 2022-11-25 DIAGNOSIS — E872 Acidosis, unspecified: Secondary | ICD-10-CM | POA: Diagnosis not present

## 2022-11-25 DIAGNOSIS — Z66 Do not resuscitate: Secondary | ICD-10-CM | POA: Diagnosis present

## 2022-11-25 DIAGNOSIS — I493 Ventricular premature depolarization: Secondary | ICD-10-CM | POA: Diagnosis present

## 2022-11-25 DIAGNOSIS — I5031 Acute diastolic (congestive) heart failure: Secondary | ICD-10-CM | POA: Diagnosis not present

## 2022-11-25 DIAGNOSIS — J1008 Influenza due to other identified influenza virus with other specified pneumonia: Secondary | ICD-10-CM | POA: Diagnosis present

## 2022-11-25 DIAGNOSIS — Z1152 Encounter for screening for COVID-19: Secondary | ICD-10-CM | POA: Diagnosis not present

## 2022-11-25 DIAGNOSIS — I5021 Acute systolic (congestive) heart failure: Secondary | ICD-10-CM | POA: Diagnosis not present

## 2022-11-25 DIAGNOSIS — Z79899 Other long term (current) drug therapy: Secondary | ICD-10-CM | POA: Diagnosis not present

## 2022-11-25 DIAGNOSIS — A419 Sepsis, unspecified organism: Principal | ICD-10-CM | POA: Diagnosis present

## 2022-11-25 DIAGNOSIS — R1110 Vomiting, unspecified: Secondary | ICD-10-CM | POA: Diagnosis not present

## 2022-11-25 DIAGNOSIS — J111 Influenza due to unidentified influenza virus with other respiratory manifestations: Secondary | ICD-10-CM

## 2022-11-25 DIAGNOSIS — B9789 Other viral agents as the cause of diseases classified elsewhere: Secondary | ICD-10-CM | POA: Diagnosis present

## 2022-11-25 DIAGNOSIS — Z86010 Personal history of colonic polyps: Secondary | ICD-10-CM

## 2022-11-25 DIAGNOSIS — I4719 Other supraventricular tachycardia: Secondary | ICD-10-CM | POA: Diagnosis not present

## 2022-11-25 DIAGNOSIS — K449 Diaphragmatic hernia without obstruction or gangrene: Secondary | ICD-10-CM | POA: Diagnosis not present

## 2022-11-25 DIAGNOSIS — R1310 Dysphagia, unspecified: Secondary | ICD-10-CM | POA: Diagnosis not present

## 2022-11-25 DIAGNOSIS — I5020 Unspecified systolic (congestive) heart failure: Secondary | ICD-10-CM | POA: Diagnosis not present

## 2022-11-25 DIAGNOSIS — B953 Streptococcus pneumoniae as the cause of diseases classified elsewhere: Secondary | ICD-10-CM | POA: Diagnosis not present

## 2022-11-25 DIAGNOSIS — A403 Sepsis due to Streptococcus pneumoniae: Secondary | ICD-10-CM | POA: Insufficient documentation

## 2022-11-25 LAB — CBC WITH DIFFERENTIAL/PLATELET
Abs Immature Granulocytes: 0.1 10*3/uL — ABNORMAL HIGH (ref 0.00–0.07)
Band Neutrophils: 15 %
Basophils Absolute: 0 10*3/uL (ref 0.0–0.1)
Basophils Relative: 0 %
Eosinophils Absolute: 0.1 10*3/uL (ref 0.0–0.5)
Eosinophils Relative: 1 %
HCT: 40.2 % (ref 39.0–52.0)
Hemoglobin: 12.9 g/dL — ABNORMAL LOW (ref 13.0–17.0)
Lymphocytes Relative: 11 %
Lymphs Abs: 0.8 10*3/uL (ref 0.7–4.0)
MCH: 28.4 pg (ref 26.0–34.0)
MCHC: 32.1 g/dL (ref 30.0–36.0)
MCV: 88.4 fL (ref 80.0–100.0)
Metamyelocytes Relative: 1 %
Monocytes Absolute: 0.1 10*3/uL (ref 0.1–1.0)
Monocytes Relative: 2 %
Myelocytes: 1 %
Neutro Abs: 6.2 10*3/uL (ref 1.7–7.7)
Neutrophils Relative %: 69 %
Platelets: 171 10*3/uL (ref 150–400)
RBC: 4.55 MIL/uL (ref 4.22–5.81)
RDW: 14.6 % (ref 11.5–15.5)
WBC: 7.4 10*3/uL (ref 4.0–10.5)
nRBC: 0 % (ref 0.0–0.2)

## 2022-11-25 LAB — COMPREHENSIVE METABOLIC PANEL
ALT: 32 U/L (ref 0–44)
AST: 71 U/L — ABNORMAL HIGH (ref 15–41)
Albumin: 3.5 g/dL (ref 3.5–5.0)
Alkaline Phosphatase: 84 U/L (ref 38–126)
Anion gap: 12 (ref 5–15)
BUN: 16 mg/dL (ref 8–23)
CO2: 25 mmol/L (ref 22–32)
Calcium: 8.6 mg/dL — ABNORMAL LOW (ref 8.9–10.3)
Chloride: 101 mmol/L (ref 98–111)
Creatinine, Ser: 0.79 mg/dL (ref 0.61–1.24)
GFR, Estimated: >60 mL/min (ref >60)
Glucose, Bld: 205 mg/dL — ABNORMAL HIGH (ref 70–99)
Potassium: 3.9 mmol/L (ref 3.5–5.1)
Sodium: 138 mmol/L (ref 135–145)
Total Bilirubin: 1.2 mg/dL (ref 0.3–1.2)
Total Protein: 7.3 g/dL (ref 6.5–8.1)

## 2022-11-25 LAB — RESP PANEL BY RT-PCR (RSV, FLU A&B, COVID)  RVPGX2
Influenza A by PCR: POSITIVE — AB
Influenza B by PCR: NEGATIVE
Resp Syncytial Virus by PCR: NEGATIVE
SARS Coronavirus 2 by RT PCR: NEGATIVE

## 2022-11-25 LAB — CBG MONITORING, ED: Glucose-Capillary: 178 mg/dL — ABNORMAL HIGH (ref 70–99)

## 2022-11-25 LAB — PROTIME-INR
INR: 1.3 — ABNORMAL HIGH (ref 0.8–1.2)
Prothrombin Time: 15.9 seconds — ABNORMAL HIGH (ref 11.4–15.2)

## 2022-11-25 LAB — LACTIC ACID, PLASMA
Lactic Acid, Venous: 4.4 mmol/L (ref 0.5–1.9)
Lactic Acid, Venous: 4.6 mmol/L (ref 0.5–1.9)

## 2022-11-25 MED ORDER — LORAZEPAM 2 MG/ML IJ SOLN
1.0000 mg | Freq: Once | INTRAMUSCULAR | Status: AC
  Administered 2022-11-26: 1 mg via INTRAVENOUS
  Filled 2022-11-25: qty 1

## 2022-11-25 MED ORDER — LORAZEPAM 1 MG PO TABS
1.0000 mg | ORAL_TABLET | Freq: Once | ORAL | Status: AC
  Administered 2022-11-25: 1 mg via ORAL
  Filled 2022-11-25: qty 1

## 2022-11-25 MED ORDER — HALOPERIDOL LACTATE 5 MG/ML IJ SOLN
3.0000 mg | Freq: Once | INTRAMUSCULAR | Status: AC
  Administered 2022-11-25: 3 mg via INTRAVENOUS
  Filled 2022-11-25: qty 1

## 2022-11-25 MED ORDER — INSULIN ASPART 100 UNIT/ML IJ SOLN
0.0000 [IU] | Freq: Three times a day (TID) | INTRAMUSCULAR | Status: DC
  Administered 2022-11-26: 1 [IU] via SUBCUTANEOUS
  Administered 2022-11-26 – 2022-11-27 (×2): 2 [IU] via SUBCUTANEOUS
  Administered 2022-11-27: 1 [IU] via SUBCUTANEOUS
  Administered 2022-11-28: 2 [IU] via SUBCUTANEOUS
  Administered 2022-11-28: 1 [IU] via SUBCUTANEOUS
  Administered 2022-11-28: 2 [IU] via SUBCUTANEOUS
  Administered 2022-11-29: 1 [IU] via SUBCUTANEOUS
  Administered 2022-11-29 (×2): 3 [IU] via SUBCUTANEOUS
  Administered 2022-11-30 – 2022-12-01 (×6): 2 [IU] via SUBCUTANEOUS
  Administered 2022-12-02 (×2): 1 [IU] via SUBCUTANEOUS
  Administered 2022-12-02 – 2022-12-03 (×2): 2 [IU] via SUBCUTANEOUS
  Administered 2022-12-03: 1 [IU] via SUBCUTANEOUS
  Administered 2022-12-04 (×2): 3 [IU] via SUBCUTANEOUS
  Administered 2022-12-04: 1 [IU] via SUBCUTANEOUS
  Administered 2022-12-05: 3 [IU] via SUBCUTANEOUS
  Filled 2022-11-25 (×3): qty 1

## 2022-11-25 MED ORDER — SODIUM CHLORIDE 0.9 % IV BOLUS
500.0000 mL | Freq: Once | INTRAVENOUS | Status: AC
  Administered 2022-11-25: 500 mL via INTRAVENOUS

## 2022-11-25 MED ORDER — PANTOPRAZOLE SODIUM 40 MG IV SOLR
40.0000 mg | INTRAVENOUS | Status: DC
  Administered 2022-11-26 – 2022-12-05 (×10): 40 mg via INTRAVENOUS
  Filled 2022-11-25 (×10): qty 10

## 2022-11-25 MED ORDER — ONDANSETRON HCL 4 MG PO TABS: 4.0000 mg | ORAL_TABLET | Freq: Four times a day (QID) | ORAL | Status: DC | PRN

## 2022-11-25 MED ORDER — PANTOPRAZOLE SODIUM 40 MG IV SOLR
40.0000 mg | Freq: Once | INTRAVENOUS | Status: AC
  Administered 2022-11-25: 40 mg via INTRAVENOUS
  Filled 2022-11-25: qty 10

## 2022-11-25 MED ORDER — OSELTAMIVIR PHOSPHATE 75 MG PO CAPS
75.0000 mg | ORAL_CAPSULE | Freq: Once | ORAL | Status: AC
  Administered 2022-11-25: 75 mg via ORAL
  Filled 2022-11-25: qty 1

## 2022-11-25 MED ORDER — ONDANSETRON HCL 4 MG/2ML IJ SOLN: 4.0000 mg | Freq: Once | INTRAMUSCULAR | Status: DC

## 2022-11-25 MED ORDER — LACTATED RINGERS IV SOLN: INTRAVENOUS | Status: DC

## 2022-11-25 MED ORDER — INSULIN ASPART 100 UNIT/ML IJ SOLN
0.0000 [IU] | Freq: Every day | INTRAMUSCULAR | Status: DC
  Administered 2022-11-30: 2 [IU] via SUBCUTANEOUS

## 2022-11-25 MED ORDER — ONDANSETRON HCL 4 MG/2ML IJ SOLN
INTRAMUSCULAR | Status: AC
  Filled 2022-11-25: qty 4

## 2022-11-25 MED ORDER — SODIUM CHLORIDE 0.9 % IV SOLN
8.0000 mg | Freq: Once | INTRAVENOUS | Status: AC
  Administered 2022-11-25: 8 mg via INTRAVENOUS
  Filled 2022-11-25: qty 4

## 2022-11-25 MED ORDER — ACETAMINOPHEN 650 MG RE SUPP
650.0000 mg | Freq: Four times a day (QID) | RECTAL | Status: DC | PRN
  Administered 2022-11-26: 650 mg via RECTAL
  Filled 2022-11-25: qty 1

## 2022-11-25 MED ORDER — HEPARIN SODIUM (PORCINE) 5000 UNIT/ML IJ SOLN
5000.0000 [IU] | Freq: Three times a day (TID) | INTRAMUSCULAR | Status: DC
  Administered 2022-11-26 – 2022-12-02 (×21): 5000 [IU] via SUBCUTANEOUS
  Filled 2022-11-25 (×21): qty 1

## 2022-11-25 MED ORDER — SODIUM CHLORIDE 0.9 % IV SOLN: Freq: Once | INTRAVENOUS | Status: AC

## 2022-11-25 MED ORDER — ACETAMINOPHEN 325 MG PO TABS
650.0000 mg | ORAL_TABLET | Freq: Four times a day (QID) | ORAL | Status: DC | PRN
  Administered 2022-11-25 – 2022-12-04 (×8): 650 mg via ORAL
  Filled 2022-11-25 (×8): qty 2

## 2022-11-25 MED ORDER — HEPARIN SODIUM (PORCINE) 5000 UNIT/ML IJ SOLN: 5000.0000 [IU] | Freq: Three times a day (TID) | INTRAMUSCULAR | Status: DC

## 2022-11-25 MED ORDER — ONDANSETRON HCL 4 MG/2ML IJ SOLN
4.0000 mg | Freq: Four times a day (QID) | INTRAMUSCULAR | Status: DC | PRN
  Administered 2022-11-27 – 2022-12-01 (×2): 4 mg via INTRAVENOUS
  Filled 2022-11-25 (×2): qty 2

## 2022-11-25 NOTE — H&P (Signed)
History and Physical    Henry [name] TDV:[id number] DOB: 12/22/1950 DOA: 11/25/2022  DOS: the patient was seen and examined on 11/25/2022  PCP: Curlene Labrum, MD   Patient coming from: Home  I have personally briefly reviewed patient's old medical records in [place]  CC: fever, cough, vomiting HPI: 71 year old Caucasian male history of reflux, type 2 diabetes, hearing loss presents to the ER with several days of fever cough, vomiting.  Increasing shortness of breath.  Was noted to be hypoxic in triage with room air saturations 85%.  Reportedly patient lives with his sister.  Caregiver is not here in the ER now.  Multiple family members have been sick.  Patient tested positive for influenza A.  Patient placed on supplemental oxygen.  Lactic acidosis of 4.4 in the ER.  Normal white count of 7.4.  [facility] hospitalist contacted for admission.  Bedside RN states that family members took the patient's hearing aids home.  Patient has severe hearing loss.  Difficult to communicate.   ED Course: hypoxic with RA sats 85%. + influenza A. Lactic acid 4.4  Review of Systems:  Review of Systems  Unable to perform ROS: Other  Difficult to obtain ROS due to hearing loss. Pt without his hearing aids.  Past Medical History:  Diagnosis Date   Diabetes ([place])    GERD (gastroesophageal reflux disease)    Mental disability     Past Surgical History:  Procedure Laterality Date   ANKLE FRACTURE SURGERY     pin   BALLOON DILATION N/A 12/30/2021   Procedure: BALLOON DILATION;  Surgeon: Eloise Harman, DO;  [facility];  Service: Endoscopy;  Laterality: N/A;   cervical fracture     wore halo brace   COLONOSCOPY  05/18/2011   Rourk; Left sided diverticula, otherwise normal exam.  Repeat in 10 years.   COLONOSCOPY WITH PROPOFOL N/A 12/30/2021   Procedure: COLONOSCOPY WITH PROPOFOL;  Surgeon: Eloise Harman, DO;  [facility];  Service: Endoscopy;   Laterality: N/A;  10:45am   ESOPHAGOGASTRODUODENOSCOPY  10/30/2005   distal esophageal narrowing s/p dilation up to 60m. ?achalasia?   ESOPHAGOGASTRODUODENOSCOPY  05/18/2011   Rourk; Submucosal ring at GE junction s/p dilation, hiatal hernia, normal examined stomach, questionable scalloping at D2 and D3 s/p biopsy.  Pathology with chronic duodenitis consistent with peptic duodenitis.   ESOPHAGOGASTRODUODENOSCOPY  10/2019   Dr. DStaci Righter moderate dilation of upper third of the esophagus and middle third of the esophagus s/p biopsied, otherwise normal exam.  Recommended considering manometry to rule out achalasia versus BPE. No dilation performed.   ESOPHAGOGASTRODUODENOSCOPY  11/28/2019   Dr. BJill Alexanders Moderate dilation of upper and middle third of the esophagus s/p biopsied, no dilatable stricture seen, normal examined stomach and duodenum. Pathology was benign.   ESOPHAGOGASTRODUODENOSCOPY (EGD) WITH PROPOFOL N/A 12/30/2021   Procedure: ESOPHAGOGASTRODUODENOSCOPY (EGD) WITH PROPOFOL;  Surgeon: CEloise Harman DO;  [facility];  Service: Endoscopy;  Laterality: N/A;   POLYPECTOMY  12/30/2021   Procedure: POLYPECTOMY;  Surgeon: CEloise Harman DO;  [facility];  Service: Endoscopy;;   TONSILLECTOMY       reports that he has never smoked. He has quit using smokeless tobacco.  His smokeless tobacco use included snuff. He reports that he does not currently use alcohol. He reports that he does not use drugs.  No Known Allergies  Family History  Problem Relation Age of Onset   Colon cancer Neg Hx  Prior to Admission medications   Medication Sig Start Date End Date Taking? Authorizing Provider  acetaminophen (TYLENOL) 500 MG tablet Take 1,000 mg by mouth every 6 (six) hours as needed for fever.   Yes [provider]  cetirizine (ZYRTEC) 10 MG tablet Take 10 mg by mouth daily.   Yes [provider]  Chlorpheniramine-Acetaminophen (CORICIDIN  HBP COLD/FLU PO) Take 1 tablet by mouth as needed (cough).   Yes [provider]  diphenhydramine-acetaminophen (TYLENOL PM) 25-500 MG TABS Take 1 tablet by mouth at bedtime as needed (pain/sleep).   Yes [provider]  metFORMIN (GLUCOPHAGE) 500 MG tablet Take 500 mg by mouth 2 (two) times daily. 11/01/21  Yes [provider]  Multiple Vitamins-Minerals (MULTIVITAMIN WITH MINERALS) tablet Take 1 tablet by mouth daily.   Yes [provider]  omeprazole (PRILOSEC) 20 MG capsule Take 20 mg by mouth daily.     Yes [provider]  sertraline (ZOLOFT) 25 MG tablet Take 25 mg by mouth daily. 10/13/22  Yes [provider]  albuterol (VENTOLIN HFA) 108 (90 Base) MCG/ACT inhaler Inhale 2 puffs into the lungs every 6 (six) hours as needed for wheezing or shortness of breath. Patient not taking: Reported on 11/25/2022 04/18/22   Heath Lark D, DO    Physical Exam: Vitals:   11/25/22 2130 11/25/22 2148 11/25/22 2150 11/25/22 2200  BP:  120/87  (!) 137/96  Pulse: (!) 109 (!) 124 (!) 110 (!) 104  Resp: (!) 28 (!) 36 (!) 31 (!) 33  Temp:      TempSrc:      SpO2: 93% (!) 89% 98% 95%  Weight:      Height:        Physical Exam Vitals and nursing note reviewed.  Constitutional:      General: He is in acute distress.     Comments: Actively vomiting  HENT:     Head: Normocephalic and atraumatic.  Cardiovascular:     Rate and Rhythm: Regular rhythm. Tachycardia present.     Pulses: Normal pulses.  Pulmonary:     Breath sounds: Normal breath sounds.  Abdominal:     General: Bowel sounds are normal. There is no distension.  Musculoskeletal:     Right lower leg: No edema.     Left lower leg: No edema.  Skin:    General: Skin is warm and dry.     Capillary Refill: Capillary refill takes less than 2 seconds.  Neurological:     Mental Status: He is alert.     Comments: Bilateral hearing loss      Labs on Admission: I have personally reviewed  following labs and imaging studies  CBC: Recent Labs  Lab 11/25/22 1952  WBC 7.4  NEUTROABS 6.2  HGB 12.9*  HCT 40.2  MCV 88.4  PLT 423   Basic Metabolic Panel: Recent Labs  Lab 11/25/22 1952  NA 138  K 3.9  CL 101  CO2 25  GLUCOSE 205*  BUN 16  CREATININE 0.79  CALCIUM 8.6*   GFR: Estimated Creatinine Clearance: 96.3 mL/min (by C-G formula based on SCr of 0.79 mg/dL). Liver Function Tests: Recent Labs  Lab 11/25/22 1952  AST 71*  ALT 32  ALKPHOS 84  BILITOT 1.2  PROT 7.3  ALBUMIN 3.5   No results for input(s): "LIPASE", "AMYLASE" in the last 168 hours. No results for input(s): "AMMONIA" in the last 168 hours. Coagulation Profile: Recent Labs  Lab 11/25/22 1952  INR 1.3*  Cardiac Enzymes: No results for input(s): "CKTOTAL", "CKMB", "CKMBINDEX", "TROPONINI", "TROPONINIHS" in the last 168 hours. BNP (last 3 results) No results for input(s): "PROBNP" in the last 8760 hours. HbA1C: No results for input(s): "HGBA1C" in the last 72 hours. CBG: No results for input(s): "GLUCAP" in the last 168 hours. Lipid Profile: No results for input(s): "CHOL", "HDL", "LDLCALC", "TRIG", "CHOLHDL", "LDLDIRECT" in the last 72 hours. Thyroid Function Tests: No results for input(s): "TSH", "T4TOTAL", "FREET4", "T3FREE", "THYROIDAB" in the last 72 hours. Anemia Panel: No results for input(s): "VITAMINB12", "FOLATE", "FERRITIN", "TIBC", "IRON", "RETICCTPCT" in the last 72 hours. Urine analysis:    Component Value Date/Time   COLORURINE YELLOW 04/15/2022 2144   APPEARANCEUR HAZY (A) 04/15/2022 2144   LABSPEC 1.018 04/15/2022 2144   PHURINE 5.0 04/15/2022 2144   GLUCOSEU NEGATIVE 04/15/2022 2144   HGBUR SMALL (A) 04/15/2022 2144   BILIRUBINUR NEGATIVE 04/15/2022 2144   KETONESUR 5 (A) 04/15/2022 2144   PROTEINUR 30 (A) 04/15/2022 2144   NITRITE NEGATIVE 04/15/2022 2144   LEUKOCYTESUR NEGATIVE 04/15/2022 2144    Radiological Exams on Admission: I have personally  reviewed images DG Chest Port 1 View  Result Date: 11/25/2022 CLINICAL DATA:  Shortness of breath. EXAM: PORTABLE CHEST 1 VIEW COMPARISON:  Chest radiograph dated 04/15/2022. FINDINGS: Bilateral mid to lower lung field confluent nodular densities most consistent with developing infiltrate. Trace left pleural effusion suspected. No pneumothorax. Stable cardiomegaly. No acute osseous pathology. IMPRESSION: Bilateral mid to lower lung field developing infiltrate. Electronically Signed   By: Anner Crete M.D.   On: 11/25/2022 19:19    EKG: My personal interpretation of EKG shows: tachycardia, sinus arrhythmia      Assessment/Plan Principal Problem:   Influenza A Active Problems:   Sepsis, viral (HCC)   Lactic acidosis   Acute respiratory failure with hypoxia (HCC)   Uncontrolled type 2 diabetes mellitus with hyperglycemia, without long-term current use of insulin (HCC)   GERD (gastroesophageal reflux disease)    Assessment and Plan: * Influenza A Admit to med telemetry bed.  Since the patient has been ill for at least several days now, Tamiflu likely not to help him.  Given that he is also having vomiting, will not start Tamiflu.  Continue with supportive care.  Continue IV fluids.  Antiemetics.  Acute respiratory failure with hypoxia (HCC) Continue with supplemental oxygen.  Lactic acidosis Continue with IV fluids.  Repeat lactic acid in the morning.  Sepsis, viral ([place]) Fulfills sepsis criteria due to tachycardia, lactic acidosis.  Likely due to influenza A not bacterial.  Hold on antibiotics.  Uncontrolled type 2 diabetes mellitus with hyperglycemia, without long-term current use of insulin ([place]) Placed on sliding scale insulin.  Clear liquid diet while he is vomiting.  GERD (gastroesophageal reflux disease) Continue Protonix.  IV while he is vomiting.   DVT prophylaxis: SQ Heparin Code Status: Full Code by default. Pt unable to give consent Family Communication: no  family members at bedside  Disposition Plan: return home  Consults called: none  Admission status: Observation, Telemetry bed   Kristopher Oppenheim, DO [facility] 11/25/2022, 11:10 PM

## 2022-11-25 NOTE — ED Provider Notes (Addendum)
[facility] EMERGENCY DEPARTMENT Provider Note   CSN: [id number] Arrival date & time: 11/25/22  1724     History  Chief Complaint  Patient presents with   Fever   Cough   Emesis    Henry [name] is a 71 y.o. male.  Pt is a 71 yo male presenting with his sister/caregiver for for fever and cough. States patient awoke yesterday with a cough, post-tussive emesis, and a fever of 100.56F orally. States he had a viral syndrome last week after contact with several sick families members that originally resolved for a few days until yesterday. His caregiver endorses fatigue, decreased appetitive, and generalized weakness. States he went to pcp today and was stating at 82% on room air. Was 85% on arrival to ED and was placed on 2L Henry [name].   The history is provided by the patient. No language interpreter was used.  Fever Associated symptoms: cough and vomiting   Associated symptoms: no chest pain, no chills, no dysuria, no ear pain, no rash and no sore throat   Cough Associated symptoms: fever   Associated symptoms: no chest pain, no chills, no ear pain, no rash, no shortness of breath and no sore throat   Emesis Associated symptoms: cough and fever   Associated symptoms: no abdominal pain, no arthralgias, no chills and no sore throat        Home Medications Prior to Admission medications   Medication Sig Start Date End Date Taking? Authorizing Provider  acetaminophen (TYLENOL) 500 MG tablet Take 1,000 mg by mouth every 6 (six) hours as needed for fever.   Yes [provider]  cetirizine (ZYRTEC) 10 MG tablet Take 10 mg by mouth daily.   Yes [provider]  Chlorpheniramine-Acetaminophen (CORICIDIN HBP COLD/FLU PO) Take 1 tablet by mouth as needed (cough).   Yes [provider]  diphenhydramine-acetaminophen (TYLENOL PM) 25-500 MG TABS Take 1 tablet by mouth at bedtime as needed (pain/sleep).   Yes [provider]  metFORMIN (GLUCOPHAGE) 500 MG tablet  Take 500 mg by mouth 2 (two) times daily. 11/01/21  Yes [provider]  Multiple Vitamins-Minerals (MULTIVITAMIN WITH MINERALS) tablet Take 1 tablet by mouth daily.   Yes [provider]  omeprazole (PRILOSEC) 20 MG capsule Take 20 mg by mouth daily.     Yes [provider]  sertraline (ZOLOFT) 25 MG tablet Take 25 mg by mouth daily. 10/13/22  Yes [provider]  albuterol (VENTOLIN HFA) 108 (90 Base) MCG/ACT inhaler Inhale 2 puffs into the lungs every 6 (six) hours as needed for wheezing or shortness of breath. Patient not taking: Reported on 11/25/2022 04/18/22   Heath Lark D, DO      Allergies    Patient has no known allergies.    Review of Systems   Review of Systems  Constitutional:  Positive for fever. Negative for chills.  HENT:  Negative for ear pain and sore throat.   Eyes:  Negative for pain and visual disturbance.  Respiratory:  Positive for cough. Negative for shortness of breath.   Cardiovascular:  Negative for chest pain and palpitations.  Gastrointestinal:  Positive for vomiting. Negative for abdominal pain.  Genitourinary:  Negative for dysuria and hematuria.  Musculoskeletal:  Negative for arthralgias and back pain.  Skin:  Negative for color change and rash.  Neurological:  Positive for weakness. Negative for seizures and syncope.  All other systems reviewed and are negative.   Physical Exam Updated Vital Signs BP (!) 137/96  Pulse (!) 104   Temp 98.9 F (37.2 C) (Oral)   Resp (!) 33   Ht '5\' 11"'$  (1.803 m)   Wt 91 kg   SpO2 95%   BMI 27.98 kg/m  Physical Exam Vitals and nursing note reviewed.  Constitutional:      General: He is not in acute distress.    Appearance: He is well-developed.  HENT:     Head: Normocephalic and atraumatic.  Eyes:     Conjunctiva/sclera: Conjunctivae normal.  Cardiovascular:     Rate and Rhythm: Regular rhythm. Tachycardia present.     Heart sounds: No murmur heard. Pulmonary:      Effort: Tachypnea and respiratory distress present.     Breath sounds: Normal breath sounds.  Abdominal:     Palpations: Abdomen is soft.     Tenderness: There is no abdominal tenderness.  Musculoskeletal:        General: No swelling.     Cervical back: Neck supple.  Skin:    General: Skin is warm and dry.     Capillary Refill: Capillary refill takes less than 2 seconds.  Neurological:     Mental Status: He is alert.  Psychiatric:        Mood and Affect: Mood normal.     ED Results / Procedures / Treatments   Labs (all labs ordered are listed, but only abnormal results are displayed) Labs Reviewed  RESP PANEL BY RT-PCR (RSV, FLU A&B, COVID)  RVPGX2 - Abnormal; Notable for the following components:      Result Value   Influenza A by PCR POSITIVE (*)    All other components within normal limits  COMPREHENSIVE METABOLIC PANEL - Abnormal; Notable for the following components:   Glucose, Bld 205 (*)    Calcium 8.6 (*)    AST 71 (*)    All other components within normal limits  LACTIC ACID, PLASMA - Abnormal; Notable for the following components:   Lactic Acid, Venous 4.4 (*)    All other components within normal limits  LACTIC ACID, PLASMA - Abnormal; Notable for the following components:   Lactic Acid, Venous 4.6 (*)    All other components within normal limits  CBC WITH DIFFERENTIAL/PLATELET - Abnormal; Notable for the following components:   Hemoglobin 12.9 (*)    Abs Immature Granulocytes 0.10 (*)    All other components within normal limits  PROTIME-INR - Abnormal; Notable for the following components:   Prothrombin Time 15.9 (*)    INR 1.3 (*)    All other components within normal limits  CULTURE, BLOOD (ROUTINE X 2)  CULTURE, BLOOD (ROUTINE X 2)  URINALYSIS, ROUTINE W REFLEX MICROSCOPIC    EKG None  Radiology DG Chest Port 1 View  Result Date: 11/25/2022 CLINICAL DATA:  Shortness of breath. EXAM: PORTABLE CHEST 1 VIEW COMPARISON:  Chest radiograph dated  04/15/2022. FINDINGS: Bilateral mid to lower lung field confluent nodular densities most consistent with developing infiltrate. Trace left pleural effusion suspected. No pneumothorax. Stable cardiomegaly. No acute osseous pathology. IMPRESSION: Bilateral mid to lower lung field developing infiltrate. Electronically Signed   By: Anner Crete M.D.   On: 11/25/2022 19:19    Procedures .Critical Care  Performed by: Lianne Cure, DO Authorized by: Lianne Cure, DO   Critical care provider statement:    Critical care time (minutes):  105   Critical care was necessary to treat or prevent imminent or life-threatening deterioration of the following conditions:  Sepsis and respiratory failure  Critical care was time spent personally by me on the following activities:  Development of treatment plan with patient or surrogate, discussions with consultants, evaluation of patient's response to treatment, examination of patient, ordering and review of laboratory studies, ordering and review of radiographic studies, ordering and performing treatments and interventions, pulse oximetry, re-evaluation of patient's condition and review of old Stem discussed with: admitting provider       Medications Ordered in ED Medications  sodium chloride 0.9 % bolus 500 mL (has no administration in time range)  0.9 %  sodium chloride infusion (has no administration in time range)  ondansetron (ZOFRAN) 8 mg in sodium chloride 0.9 % 50 mL IVPB (has no administration in time range)  oseltamivir (TAMIFLU) capsule 75 mg (75 mg Oral Given 11/25/22 2146)  LORazepam (ATIVAN) tablet 1 mg (1 mg Oral Given 11/25/22 2146)    ED Course/ Medical Decision Making/ A&P                           Medical Decision Making Amount and/or Complexity of Data Reviewed Labs: ordered. Radiology: ordered.  Risk Prescription drug management. Decision regarding hospitalization.   10:40 PM 70 yo male presenting with his  sister/caregiver for for fever, cough, and worsening of baseline altered mental status.  Patient is pleasantly demented, afebrile, tachycardia, tachypnea, and hypoxia.  Patient improving on nasal cannula.  Currently 93% on 2 L.  ECG pending. Telemetry demonstrating sinus tachycardia.  Laboratory studies demonstrate no leukocytosis.  Chest x-ray demonstrates developing infiltrate bilaterally in the lower lung fields.  Patient positive for influenza.  Tamiflu ordered.  Patient also has lactic acidosis secondary to sepsis versus dehydration.  500 cc fluid bolus and continuous fluids at 125 mL/h ordered.  Repeat lactic pending.  Blood culture sent.  Recommending admission at this time for acute respiratory failure with hypoxia secondary to influenza and sepsis. NO leukocytosis or neutrophilia-doubt bacterial pneumonia.  Family agreeable to plan. Patient accepted by admitting physician Dr. Bridgett Larsson.        Final Clinical Impression(s) / ED Diagnoses Final diagnoses:  Altered mental status, unspecified altered mental status type  Influenza  Acute respiratory failure with hypoxia (HCC)  Lactic acidosis  Sepsis, due to unspecified organism, unspecified whether acute organ dysfunction present [facility])    Rx / DC Orders ED Discharge Orders     None         Lianne Cure, DO 19/62/22 [id number]    Campbell Stall P, DO 92/11/94 2240

## 2022-11-25 NOTE — ED Triage Notes (Signed)
Patient's sister states fever, cough, emesis and SHOB for a couple of days.

## 2022-11-25 NOTE — Assessment & Plan Note (Signed)
Continue with supplemental oxygen.

## 2022-11-25 NOTE — Subjective & Objective (Signed)
CC: fever, cough, vomiting HPI: 71 year old Caucasian male history of reflux, type 2 diabetes, hearing loss presents to the ER with several days of fever cough, vomiting.  Increasing shortness of breath.  Was noted to be hypoxic in triage with room air saturations 85%.  Reportedly patient lives with his sister.  Caregiver is not here in the ER now.  Multiple family members have been sick.  Patient tested positive for influenza A.  Patient placed on supplemental oxygen.  Lactic acidosis of 4.4 in the ER.  Normal white count of 7.4.  [facility] hospitalist contacted for admission.  Bedside RN states that family members took the patient's hearing aids home.  Patient has severe hearing loss.  Difficult to communicate.

## 2022-11-25 NOTE — Assessment & Plan Note (Signed)
Admit to med telemetry bed.  Since the patient has been ill for at least several days now, Tamiflu likely not to help him.  Given that he is also having vomiting, will not start Tamiflu.  Continue with supportive care.  Continue IV fluids.  Antiemetics.

## 2022-11-25 NOTE — ED Notes (Signed)
85% RA; 2L/[place] applied

## 2022-11-25 NOTE — ED Notes (Signed)
Pt started throwing up after getting medication. EDP made aware

## 2022-11-25 NOTE — Assessment & Plan Note (Signed)
Fulfills sepsis criteria due to tachycardia, lactic acidosis.  Likely due to influenza A not bacterial.  Hold on antibiotics.

## 2022-11-25 NOTE — ED Notes (Signed)
AC called for pt's zofran. AC to bring

## 2022-11-25 NOTE — Assessment & Plan Note (Signed)
Placed on sliding scale insulin.  Clear liquid diet while he is vomiting.

## 2022-11-25 NOTE — Assessment & Plan Note (Signed)
Continue Protonix.  IV while he is vomiting.

## 2022-11-25 NOTE — Assessment & Plan Note (Signed)
Continue with IV fluids.  Repeat lactic acid in the morning.

## 2022-11-26 ENCOUNTER — Other Ambulatory Visit (HOSPITAL_COMMUNITY): Payer: Self-pay | Admitting: *Deleted

## 2022-11-26 ENCOUNTER — Inpatient Hospital Stay (HOSPITAL_COMMUNITY): Payer: Medicare Other

## 2022-11-26 DIAGNOSIS — R7881 Bacteremia: Secondary | ICD-10-CM | POA: Diagnosis not present

## 2022-11-26 DIAGNOSIS — J690 Pneumonitis due to inhalation of food and vomit: Secondary | ICD-10-CM | POA: Diagnosis present

## 2022-11-26 DIAGNOSIS — E119 Type 2 diabetes mellitus without complications: Secondary | ICD-10-CM | POA: Diagnosis not present

## 2022-11-26 DIAGNOSIS — I5021 Acute systolic (congestive) heart failure: Secondary | ICD-10-CM | POA: Diagnosis not present

## 2022-11-26 DIAGNOSIS — Z79899 Other long term (current) drug therapy: Secondary | ICD-10-CM | POA: Diagnosis not present

## 2022-11-26 DIAGNOSIS — Z66 Do not resuscitate: Secondary | ICD-10-CM | POA: Diagnosis present

## 2022-11-26 DIAGNOSIS — I493 Ventricular premature depolarization: Secondary | ICD-10-CM | POA: Diagnosis present

## 2022-11-26 DIAGNOSIS — K449 Diaphragmatic hernia without obstruction or gangrene: Secondary | ICD-10-CM | POA: Diagnosis not present

## 2022-11-26 DIAGNOSIS — Z794 Long term (current) use of insulin: Secondary | ICD-10-CM | POA: Diagnosis not present

## 2022-11-26 DIAGNOSIS — J9601 Acute respiratory failure with hypoxia: Secondary | ICD-10-CM | POA: Diagnosis not present

## 2022-11-26 DIAGNOSIS — J101 Influenza due to other identified influenza virus with other respiratory manifestations: Secondary | ICD-10-CM | POA: Diagnosis not present

## 2022-11-26 DIAGNOSIS — H9193 Unspecified hearing loss, bilateral: Secondary | ICD-10-CM | POA: Diagnosis present

## 2022-11-26 DIAGNOSIS — R6520 Severe sepsis without septic shock: Secondary | ICD-10-CM | POA: Diagnosis not present

## 2022-11-26 DIAGNOSIS — K219 Gastro-esophageal reflux disease without esophagitis: Secondary | ICD-10-CM | POA: Diagnosis not present

## 2022-11-26 DIAGNOSIS — I700 Atherosclerosis of aorta: Secondary | ICD-10-CM | POA: Diagnosis not present

## 2022-11-26 DIAGNOSIS — E872 Acidosis, unspecified: Secondary | ICD-10-CM | POA: Diagnosis not present

## 2022-11-26 DIAGNOSIS — I4719 Other supraventricular tachycardia: Secondary | ICD-10-CM | POA: Diagnosis not present

## 2022-11-26 DIAGNOSIS — R1110 Vomiting, unspecified: Secondary | ICD-10-CM | POA: Diagnosis not present

## 2022-11-26 DIAGNOSIS — K2970 Gastritis, unspecified, without bleeding: Secondary | ICD-10-CM | POA: Diagnosis not present

## 2022-11-26 DIAGNOSIS — E1165 Type 2 diabetes mellitus with hyperglycemia: Secondary | ICD-10-CM | POA: Diagnosis not present

## 2022-11-26 DIAGNOSIS — Z1152 Encounter for screening for COVID-19: Secondary | ICD-10-CM | POA: Diagnosis not present

## 2022-11-26 DIAGNOSIS — A419 Sepsis, unspecified organism: Secondary | ICD-10-CM | POA: Diagnosis not present

## 2022-11-26 DIAGNOSIS — J1008 Influenza due to other identified influenza virus with other specified pneumonia: Secondary | ICD-10-CM | POA: Diagnosis present

## 2022-11-26 DIAGNOSIS — K2289 Other specified disease of esophagus: Secondary | ICD-10-CM | POA: Diagnosis not present

## 2022-11-26 DIAGNOSIS — M6281 Muscle weakness (generalized): Secondary | ICD-10-CM | POA: Diagnosis not present

## 2022-11-26 DIAGNOSIS — K220 Achalasia of cardia: Secondary | ICD-10-CM | POA: Diagnosis not present

## 2022-11-26 DIAGNOSIS — J90 Pleural effusion, not elsewhere classified: Secondary | ICD-10-CM | POA: Diagnosis not present

## 2022-11-26 DIAGNOSIS — G9341 Metabolic encephalopathy: Secondary | ICD-10-CM | POA: Diagnosis not present

## 2022-11-26 DIAGNOSIS — E876 Hypokalemia: Secondary | ICD-10-CM | POA: Diagnosis present

## 2022-11-26 DIAGNOSIS — I5031 Acute diastolic (congestive) heart failure: Secondary | ICD-10-CM

## 2022-11-26 DIAGNOSIS — R1314 Dysphagia, pharyngoesophageal phase: Secondary | ICD-10-CM | POA: Diagnosis present

## 2022-11-26 DIAGNOSIS — F0390 Unspecified dementia without behavioral disturbance: Secondary | ICD-10-CM | POA: Diagnosis present

## 2022-11-26 DIAGNOSIS — I3139 Other pericardial effusion (noninflammatory): Secondary | ICD-10-CM | POA: Diagnosis not present

## 2022-11-26 DIAGNOSIS — B9789 Other viral agents as the cause of diseases classified elsewhere: Secondary | ICD-10-CM | POA: Diagnosis not present

## 2022-11-26 DIAGNOSIS — A403 Sepsis due to Streptococcus pneumoniae: Secondary | ICD-10-CM | POA: Diagnosis not present

## 2022-11-26 DIAGNOSIS — R1310 Dysphagia, unspecified: Secondary | ICD-10-CM | POA: Diagnosis not present

## 2022-11-26 DIAGNOSIS — I5020 Unspecified systolic (congestive) heart failure: Secondary | ICD-10-CM | POA: Diagnosis not present

## 2022-11-26 DIAGNOSIS — A4189 Other specified sepsis: Secondary | ICD-10-CM | POA: Diagnosis not present

## 2022-11-26 LAB — BLOOD CULTURE ID PANEL (REFLEXED) - BCID2

## 2022-11-26 LAB — COMPREHENSIVE METABOLIC PANEL
ALT: 34 U/L (ref 0–44)
AST: 79 U/L — ABNORMAL HIGH (ref 15–41)
Albumin: 3 g/dL — ABNORMAL LOW (ref 3.5–5.0)
Alkaline Phosphatase: 72 U/L (ref 38–126)
Anion gap: 11 (ref 5–15)
BUN: 19 mg/dL (ref 8–23)
CO2: 22 mmol/L (ref 22–32)
Calcium: 8 mg/dL — ABNORMAL LOW (ref 8.9–10.3)
Chloride: 107 mmol/L (ref 98–111)
Creatinine, Ser: 0.81 mg/dL (ref 0.61–1.24)
GFR, Estimated: >60 mL/min (ref >60)
Glucose, Bld: 166 mg/dL — ABNORMAL HIGH (ref 70–99)
Potassium: 4.2 mmol/L (ref 3.5–5.1)
Sodium: 140 mmol/L (ref 135–145)
Total Bilirubin: 0.8 mg/dL (ref 0.3–1.2)
Total Protein: 6.1 g/dL — ABNORMAL LOW (ref 6.5–8.1)

## 2022-11-26 LAB — CBC WITH DIFFERENTIAL/PLATELET
Abs Immature Granulocytes: 0.9 10*3/uL — ABNORMAL HIGH (ref 0.00–0.07)
Band Neutrophils: 31 %
Basophils Absolute: 0 10*3/uL (ref 0.0–0.1)
Basophils Relative: 0 %
Eosinophils Absolute: 0 10*3/uL (ref 0.0–0.5)
Eosinophils Relative: 0 %
HCT: 38.9 % — ABNORMAL LOW (ref 39.0–52.0)
Hemoglobin: 12.5 g/dL — ABNORMAL LOW (ref 13.0–17.0)
Lymphocytes Relative: 13 %
Lymphs Abs: 1.6 10*3/uL (ref 0.7–4.0)
MCH: 28.7 pg (ref 26.0–34.0)
MCHC: 32.1 g/dL (ref 30.0–36.0)
MCV: 89.4 fL (ref 80.0–100.0)
Metamyelocytes Relative: 7 %
Monocytes Absolute: 0.5 10*3/uL (ref 0.1–1.0)
Monocytes Relative: 4 %
Neutro Abs: 9.4 10*3/uL — ABNORMAL HIGH (ref 1.7–7.7)
Neutrophils Relative %: 45 %
Platelets: 165 10*3/uL (ref 150–400)
RBC: 4.35 MIL/uL (ref 4.22–5.81)
RDW: 14.9 % (ref 11.5–15.5)
WBC: 12.4 10*3/uL — ABNORMAL HIGH (ref 4.0–10.5)
nRBC: 0 % (ref 0.0–0.2)

## 2022-11-26 LAB — CBG MONITORING, ED
Glucose-Capillary: 164 mg/dL — ABNORMAL HIGH (ref 70–99)
Glucose-Capillary: 104 mg/dL — ABNORMAL HIGH (ref 70–99)
Glucose-Capillary: 145 mg/dL — ABNORMAL HIGH (ref 70–99)
Glucose-Capillary: 132 mg/dL — ABNORMAL HIGH (ref 70–99)

## 2022-11-26 LAB — URINALYSIS, ROUTINE W REFLEX MICROSCOPIC
Bilirubin Urine: NEGATIVE
Glucose, UA: 50 mg/dL — AB
Hgb urine dipstick: NEGATIVE
Ketones, ur: 5 mg/dL — AB
Leukocytes,Ua: NEGATIVE
Nitrite: NEGATIVE
Protein, ur: >=300 mg/dL — AB
Specific Gravity, Urine: 1.025 (ref 1.005–1.030)
pH: 5 (ref 5.0–8.0)

## 2022-11-26 LAB — LACTIC ACID, PLASMA: Lactic Acid, Venous: 3.7 mmol/L (ref 0.5–1.9)

## 2022-11-26 LAB — ECHOCARDIOGRAM COMPLETE
Area-P 1/2: 4.94 cm2
Calc EF: 30.1 %
Height: 71 in
S' Lateral: 4.6 cm
Single Plane A2C EF: 20.3 %
Single Plane A4C EF: 35.3 %
Weight: 3209.9 oz

## 2022-11-26 LAB — MAGNESIUM: Magnesium: 1.6 mg/dL — ABNORMAL LOW (ref 1.7–2.4)

## 2022-11-26 LAB — BLOOD GAS, ARTERIAL
Acid-base deficit: 0.3 mmol/L (ref 0.0–2.0)
Bicarbonate: 24 mmol/L (ref 20.0–28.0)
Drawn by: 38235
O2 Saturation: 99.9 %
Patient temperature: 37
pCO2 arterial: 37 mmHg (ref 32–48)
pH, Arterial: 7.42 (ref 7.35–7.45)
pO2, Arterial: 188 mmHg — ABNORMAL HIGH (ref 83–108)

## 2022-11-26 LAB — PROCALCITONIN: Procalcitonin: 9.33 ng/mL

## 2022-11-26 LAB — BRAIN NATRIURETIC PEPTIDE: B Natriuretic Peptide: 2649 pg/mL — ABNORMAL HIGH (ref 0.0–100.0)

## 2022-11-26 MED ORDER — MAGNESIUM SULFATE 2 GM/50ML IV SOLN
2.0000 g | Freq: Once | INTRAVENOUS | Status: AC
  Administered 2022-11-26: 2 g via INTRAVENOUS
  Filled 2022-11-26: qty 50

## 2022-11-26 MED ORDER — FUROSEMIDE 10 MG/ML IJ SOLN
INTRAMUSCULAR | Status: AC
  Filled 2022-11-26: qty 4

## 2022-11-26 MED ORDER — IPRATROPIUM-ALBUTEROL 0.5-2.5 (3) MG/3ML IN SOLN
RESPIRATORY_TRACT | Status: AC
  Filled 2022-11-26: qty 3

## 2022-11-26 MED ORDER — VANCOMYCIN HCL 1750 MG/350ML IV SOLN: 1750.0000 mg | INTRAVENOUS | Status: DC

## 2022-11-26 MED ORDER — HALOPERIDOL LACTATE 5 MG/ML IJ SOLN
5.0000 mg | Freq: Once | INTRAMUSCULAR | Status: AC
  Administered 2022-11-26: 5 mg via INTRAVENOUS
  Filled 2022-11-26: qty 1

## 2022-11-26 MED ORDER — VANCOMYCIN HCL 2000 MG/400ML IV SOLN
2000.0000 mg | Freq: Once | INTRAVENOUS | Status: AC
  Administered 2022-11-26: 2000 mg via INTRAVENOUS
  Filled 2022-11-26: qty 400

## 2022-11-26 MED ORDER — PERFLUTREN LIPID MICROSPHERE
1.0000 mL | INTRAVENOUS | Status: AC | PRN
  Administered 2022-11-26: 3 mL via INTRAVENOUS

## 2022-11-26 MED ORDER — IPRATROPIUM-ALBUTEROL 0.5-2.5 (3) MG/3ML IN SOLN
3.0000 mL | Freq: Four times a day (QID) | RESPIRATORY_TRACT | Status: DC
  Administered 2022-11-26 (×4): 3 mL via RESPIRATORY_TRACT
  Filled 2022-11-26 (×3): qty 3

## 2022-11-26 MED ORDER — FUROSEMIDE 10 MG/ML IJ SOLN
40.0000 mg | Freq: Once | INTRAMUSCULAR | Status: AC
  Administered 2022-11-26: 40 mg via INTRAVENOUS

## 2022-11-26 MED ORDER — SODIUM CHLORIDE 0.9 % IV SOLN
3.0000 g | Freq: Four times a day (QID) | INTRAVENOUS | Status: DC
  Administered 2022-11-26 – 2022-11-27 (×6): 3 g via INTRAVENOUS
  Filled 2022-11-26 (×9): qty 8

## 2022-11-26 MED ORDER — SODIUM CHLORIDE 0.9 % IV SOLN
500.0000 mg | INTRAVENOUS | Status: DC
  Administered 2022-11-26 – 2022-11-27 (×2): 500 mg via INTRAVENOUS
  Filled 2022-11-26 (×2): qty 5

## 2022-11-26 MED ORDER — OSELTAMIVIR PHOSPHATE 75 MG PO CAPS
75.0000 mg | ORAL_CAPSULE | Freq: Two times a day (BID) | ORAL | Status: AC
  Administered 2022-11-26 – 2022-11-30 (×10): 75 mg via ORAL
  Filled 2022-11-26 (×11): qty 1

## 2022-11-26 NOTE — ED Notes (Signed)
RT at bedside.

## 2022-11-26 NOTE — Progress Notes (Signed)
Responded to nursing call:  intermittent agitation   Subjective: Pt denies cp, abd pain.  Complains cough and sob.    Vitals:   11/26/22 0959 11/26/22 1300 11/26/22 1354 11/26/22 1436  BP:  102/68    Pulse:  (!) 105    Resp:  (!) 28    Temp: 98.9 F (37.2 C)   (!) 101.2 F (38.4 C)  TempSrc: Oral   Axillary  SpO2:  93% 99%   Weight:      Height:       CV--RRR Lung--bilateral rhonchi Abd--soft+BS/NT   Assessment/Plan: Acute resp failure -influenza -degree of fluid overload -Echo results pending -give lasix IV x 1 40 mg -remains on HFNC 38F Acute metabolic enceph -more alert than this am -pt is easily redirectable -avoid/minimize sedation if possible Bacteremia -GPC in chains -empiric vanc GOC -discussed with pt and sister--confirmed DNR     Orson Eva, DO [facility]

## 2022-11-26 NOTE — Progress Notes (Signed)
*  PRELIMINARY RESULTS* Echocardiogram 2D Echocardiogram has been performed with Definity.  Samuel Germany 11/26/2022, 1:43 PM

## 2022-11-26 NOTE — TOC Progression Note (Signed)
  Transition of Care [facility]) Screening Note   Patient Details  Name: Henry [name] Date of Birth: 1951/08/05   Transition of Care [facility]) CM/SW Contact:    Boneta Lucks, RN Phone Number: 11/26/2022, 1:57 PM    Transition of Care Department [facility]) has reviewed patient and no TOC needs have been identified at this time. We will continue to monitor patient advancement through interdisciplinary progression rounds. If new patient transition needs arise, please place a TOC consult.       Barriers to Discharge: Continued Medical Work up

## 2022-11-26 NOTE — Hospital Course (Addendum)
71 year old male with a history of hearing impairment, esophageal dysphagia, GERD, diabetes mellitus type 2 presenting with fever, coughing, and vomiting for the last 2 to 3 days.  He has had increasing shortness of breath.  Apparently multiple family members have been sick. Reportedly patient lives with his sister. Caregiver is not here in the ER now.   Bedside RN states that family members took the patient's hearing aids home.  Patient has severe hearing loss.  Difficult to communicate.   In the ED, the patient was febrile up to 101.2 F with soft blood pressure down to 90/62.  Oxygen saturation was 85% room air.  WBC 7.4, hemoglobin 12.9, platelets 171,000.  BMP showed sodium 130, potassium 3.9, bicarbonate 25, serum creatinine 0.79.  AST 71, ALT 32, alk phosphatase 84, total bilirubin 1.2.  Lactic acid peaked at 4.6.  The patient was started on oseltamavir.  Influenza PCR was positive.  The patient became increasingly hypoxemic requiring high flow nasal cannula up to 15 L.  The patient has slowly improved with IV abx.  He was found to have acute HFrEF with EF 25-30% and started on IV lasix.  His hospitalization has been prolonged by his slow improvement as well as his intractable vomiting and SVT/atrial tachycardia.

## 2022-11-26 NOTE — Progress Notes (Addendum)
Pharmacy Antibiotic Note  Henry [name] is a 71 y.o. male admitted on 11/25/2022 with  aspiration pneumonia /bacteremia.  Pharmacy has been consulted for Unasyn dosing.  Plan: Vancomycin 2000 mg IV x 1 dose. Vancomycin 1750 mg IV every 24 hours. Unasyn 3000 mg IV every 6 hours. Monitor labs, c/s, and vanco levels as indicated.  Height: '5\' 11"'$  (180.3 cm) Weight: 91 kg (200 lb 9.9 oz) IBW/kg (Calculated) : 75.3  Temp (24hrs), Avg:99.7 F (37.6 C), Min:98.5 F (36.9 C), Max:101.2 F (38.4 C)  Recent Labs  Lab 11/25/22 1952 11/25/22 2209 11/26/22 0450 11/26/22 0455  WBC 7.4  --  12.4*  --   CREATININE 0.79  --  0.81  --   LATICACIDVEN 4.4* 4.6*  --  3.7*    Estimated Creatinine Clearance: 95.1 mL/min (by C-G formula based on SCr of 0.81 mg/dL).    No Known Allergies  Antimicrobials this admission: Vanco 12/28 >> Unasyn 12/28 >> Azith 12/28 >> Tamiflu 12/28 >>    Microbiology results: 12/28 BCx: gram + cocci in 2 sets  Influenza A +  Thank you for allowing pharmacy to be a part of this patient's care.  Ramond Craver 11/26/2022 7:43 AM

## 2022-11-26 NOTE — ED Notes (Signed)
Breakfast tray given. °

## 2022-11-26 NOTE — Progress Notes (Addendum)
PROGRESS NOTE  ALEXIS MIZUNO TFT:[id number] DOB: [birth year] DOA: 11/25/2022 PCP: Curlene Labrum, MD  Brief History:  71 year old male with a history of hearing impairment, esophageal dysphagia, GERD, diabetes mellitus type 2 presenting with fever, coughing, and vomiting for the last 2 to 3 days.  He has had increasing shortness of breath.  Apparently multiple family members have been sick. Reportedly patient lives with his sister. Caregiver is not here in the ER now.   Bedside RN states that family members took the patient's hearing aids home.  Patient has severe hearing loss.  Difficult to communicate.   In the ED, the patient was febrile up to 101.2 F with soft blood pressure down to 90/62.  Oxygen saturation was 85% room air. WBC 7.4, hemoglobin 12.9, platelets 171,000.  BMP showed sodium 130, potassium 3.9, bicarbonate 25, serum creatinine 0.79.  AST 71, ALT 32, alk phosphatase 84, total bilirubin 1.2.  Lactic acid peaked at 4.6.  The patient was started on oseltamavir.  Influenza PCR was positive. The patient became increasingly hypoxemic requiring high flow nasal cannula up to 15 L.     Assessment/Plan: Acute respiratory failure with hypoxia -11/26/22 ABG 7.4 2/37/188/24 (15L) -Secondary to influenza -Concerned about aspiration pneumonitis with a history of esophageal dysphagia and vomiting -Wean oxygen as tolerated -The patient does have signs of volume overload -BNP 2649 -Saline lock IV fluids  Influenza A -Start oseltamavir -Check PCT -Clear liquid diet for now given the patient's vomiting  Esophageal dysphagia -Given the patient's history of this and respiratory failure, concern about aspiration pneumonitis -Start empiric Unasyn  Lactic acidosis -Primarily driven by the patient's hypoxia  Viral sepsis -Presented with fever and tachycardia  Diabetes mellitus type 2 with hyperglycemia -Check A1c -NovoLog sliding scale -Holding  metformin  GERD -Continue pantoprazole  Hypomagnesemia -replete      Family Communication:   Family at bedside  Consultants:  none  Code Status:  FULL  DVT Prophylaxis:  [place] Heparin   Procedures: As Listed in Progress Note Above  Antibiotics: Unasyn 12/28>> Azithro 12/28>>      Subjective: Patient denies any chest pain, vomiting.  He feels nauseous.  He denies any abdominal pain.  Review of systems limited secondary to patient's hearing impairment  Objective: Vitals:   11/26/22 0430 11/26/22 0556 11/26/22 0600 11/26/22 0630  BP: 103/68  123/68 112/65  Pulse: 87  96 97  Resp: (!) 36  (!) 30 (!) 37  Temp:  (!) 101.2 F (38.4 C)    TempSrc:  Axillary    SpO2: 96%  91% 96%  Weight:      Height:        Intake/Output Summary (Last 24 hours) at 11/26/2022 0718 Last data filed at 11/25/2022 2345 Gross per 24 hour  Intake 641.67 ml  Output --  Net 641.67 ml   Weight change:  Exam:  General:  Pt is alert, intermittently follows commands appropriately, HEENT: No icterus, No thrush, No neck mass, [place]/AT Cardiovascular: RRR, S1/S2, no rubs, no gallops Respiratory: Bilateral crackles. Abdomen: Soft/+BS, non tender, non distended, no guarding Extremities: 1 + LE edema, No lymphangitis, No petechiae, No rashes, no synovitis   Data Reviewed: I have personally reviewed following labs and imaging studies Basic Metabolic Panel: Recent Labs  Lab 11/25/22 1952 11/26/22 0450  NA 138 140  K 3.9 4.2  CL 101 107  CO2 25 22  GLUCOSE 205* 166*  BUN 16 19  CREATININE 0.79 0.81  CALCIUM 8.6* 8.0*  MG  --  1.6*   Liver Function Tests: Recent Labs  Lab 11/25/22 1952 11/26/22 0450  AST 71* 79*  ALT 32 34  ALKPHOS 84 72  BILITOT 1.2 0.8  PROT 7.3 6.1*  ALBUMIN 3.5 3.0*   No results for input(s): "LIPASE", "AMYLASE" in the last 168 hours. No results for input(s): "AMMONIA" in the last 168 hours. Coagulation Profile: Recent Labs  Lab 11/25/22 1952  INR  1.3*   CBC: Recent Labs  Lab 11/25/22 1952 11/26/22 0450  WBC 7.4 12.4*  NEUTROABS 6.2 9.4*  HGB 12.9* 12.5*  HCT 40.2 38.9*  MCV 88.4 89.4  PLT 171 165   Cardiac Enzymes: No results for input(s): "CKTOTAL", "CKMB", "CKMBINDEX", "TROPONINI" in the last 168 hours. BNP: Invalid input(s): "POCBNP" CBG: Recent Labs  Lab 11/25/22 2339  GLUCAP 178*   HbA1C: No results for input(s): "HGBA1C" in the last 72 hours. Urine analysis:    Component Value Date/Time   COLORURINE AMBER (A) 11/26/2022 0108   APPEARANCEUR HAZY (A) 11/26/2022 0108   LABSPEC 1.025 11/26/2022 0108   PHURINE 5.0 11/26/2022 0108   GLUCOSEU 50 (A) 11/26/2022 0108   HGBUR NEGATIVE 11/26/2022 0108   BILIRUBINUR NEGATIVE 11/26/2022 0108   KETONESUR 5 (A) 11/26/2022 0108   PROTEINUR >=300 (A) 11/26/2022 0108   NITRITE NEGATIVE 11/26/2022 0108   LEUKOCYTESUR NEGATIVE 11/26/2022 0108   Sepsis Labs: _0 (procalcitonin:4,lacticidven:4) ) Recent Results (from the past 240 hour(s))  Culture, blood (Routine x 2)     Status: None (Preliminary result)   Collection Time: 11/25/22  7:52 PM   Specimen: Blood  Result Value Ref Range Status   Specimen Description BLOOD LEFT ANTECUBITAL  Final   Special Requests   Final    BOTTLES DRAWN AEROBIC AND ANAEROBIC Blood Culture adequate volume Performed at [facility], [address] [place], [place] [address]    Culture PENDING  Incomplete   Report Status PENDING  Incomplete  Culture, blood (Routine x 2)     Status: None (Preliminary result)   Collection Time: 11/25/22  7:53 PM   Specimen: Blood  Result Value Ref Range Status   Specimen Description BLOOD RIGHT ANTECUBITAL  Final   Special Requests   Final    BOTTLES DRAWN AEROBIC AND ANAEROBIC Blood Culture adequate volume Performed at [facility], [address] [place], [place] [address]    Culture PENDING  Incomplete   Report Status PENDING  Incomplete  Resp panel by RT-PCR (RSV, Flu A&B, Covid) Anterior  Nasal Swab     Status: Abnormal   Collection Time: 11/25/22  8:05 PM   Specimen: Anterior Nasal Swab  Result Value Ref Range Status   SARS Coronavirus 2 by RT PCR NEGATIVE NEGATIVE Final    Comment: (NOTE) SARS-CoV-2 target nucleic acids are NOT DETECTED.  The SARS-CoV-2 RNA is generally detectable in upper respiratory specimens during the acute phase of infection. The lowest concentration of SARS-CoV-2 viral copies this assay can detect is 138 copies/mL. A negative result does not preclude SARS-Cov-2 infection and should not be used as the sole basis for treatment or other patient management decisions. A negative result may occur with  improper specimen collection/handling, submission of specimen other than nasopharyngeal swab, presence of viral mutation(s) within the areas targeted by this assay, and inadequate number of viral copies(<138 copies/mL). A negative result must be combined with clinical observations, patient history, and epidemiological information. The expected result is Negative.  Fact Sheet  for Patients:  EntrepreneurPulse.com.au  Fact Sheet for Healthcare Providers:  IncredibleEmployment.be  This test is no t yet approved or cleared by the [place] FDA and  has been authorized for detection and/or diagnosis of SARS-CoV-2 by FDA under an Emergency Use Authorization (EUA). This EUA will remain  in effect (meaning this test can be used) for the duration of the COVID-19 declaration under Section 564(b)(1) of the Act, 21 U.S.C.section 360bbb-3(b)(1), unless the authorization is terminated  or revoked sooner.       Influenza A by PCR POSITIVE (A) NEGATIVE Final   Influenza B by PCR NEGATIVE NEGATIVE Final    Comment: (NOTE) The Xpert Xpress SARS-CoV-2/FLU/RSV plus assay is intended as an aid in the diagnosis of influenza from Nasopharyngeal swab specimens and should not be used as a sole basis for treatment. Nasal washings  and aspirates are unacceptable for Xpert Xpress SARS-CoV-2/FLU/RSV testing.  Fact Sheet for Patients: EntrepreneurPulse.com.au  Fact Sheet for Healthcare Providers: IncredibleEmployment.be  This test is not yet approved or cleared by the [place] FDA and has been authorized for detection and/or diagnosis of SARS-CoV-2 by FDA under an Emergency Use Authorization (EUA). This EUA will remain in effect (meaning this test can be used) for the duration of the COVID-19 declaration under Section 564(b)(1) of the Act, 21 U.S.C. section 360bbb-3(b)(1), unless the authorization is terminated or revoked.     Resp Syncytial Virus by PCR NEGATIVE NEGATIVE Final    Comment: (NOTE) Fact Sheet for Patients: EntrepreneurPulse.com.au  Fact Sheet for Healthcare Providers: IncredibleEmployment.be  This test is not yet approved or cleared by the [place] FDA and has been authorized for detection and/or diagnosis of SARS-CoV-2 by FDA under an Emergency Use Authorization (EUA). This EUA will remain in effect (meaning this test can be used) for the duration of the COVID-19 declaration under Section 564(b)(1) of the Act, 21 U.S.C. section 360bbb-3(b)(1), unless the authorization is terminated or revoked.  Performed at [facility], [address], [place], [place] [address]      Scheduled Meds:  heparin  5,000 Units Subcutaneous Q8H   insulin aspart  0-5 Units Subcutaneous QHS   insulin aspart  0-9 Units Subcutaneous TID WC   ipratropium-albuterol  3 mL Nebulization Q6H   ipratropium-albuterol       ondansetron  4 mg Intravenous Once   pantoprazole (PROTONIX) IV  40 mg Intravenous Q24H   Continuous Infusions:  lactated ringers 100 mL/hr at 11/25/22 2330    Procedures/Studies: DG Chest Port 1 View  Result Date: 11/25/2022 CLINICAL DATA:  Shortness of breath. EXAM: PORTABLE CHEST 1 VIEW COMPARISON:  Chest  radiograph dated 04/15/2022. FINDINGS: Bilateral mid to lower lung field confluent nodular densities most consistent with developing infiltrate. Trace left pleural effusion suspected. No pneumothorax. Stable cardiomegaly. No acute osseous pathology. IMPRESSION: Bilateral mid to lower lung field developing infiltrate. Electronically Signed   By: Anner Crete M.D.   On: 11/25/2022 19:19    Orson Eva, DO  [facility]  If 7PM-7AM, please contact night-coverage [email or web address] Password [email or web address] 11/26/2022, 7:18 AM   LOS: 0 days

## 2022-11-26 NOTE — ED Notes (Signed)
Dinner tray given

## 2022-11-26 NOTE — ED Notes (Signed)
Patient has ripped off pulse oxygen, pulled the call bell out of the wall and is becoming more agitated. Patient warm to the touch with axilary temp 101.2 and wet cough. With any movement there is immediate drop in oxygen saturation. Dr. Carles Collet at bedside to evaluate patient. Patient is confused

## 2022-11-26 NOTE — ED Notes (Signed)
Pt is sleeping. Pt's O2 at 88% on 3LNC. Changed O2 to 6LNC, O2 sat 90%. Placed pt on NRB 14L, pt's O2 at 96%.  Pt tachypnic. EDP made aware.

## 2022-11-26 NOTE — ED Notes (Signed)
Pt keeps attempting to get out of bed. Multiple attempts to redirect pt. Staff are concerned about pt's safety.  See mar

## 2022-11-26 NOTE — ED Notes (Signed)
Lunch tray given. 

## 2022-11-27 DIAGNOSIS — J101 Influenza due to other identified influenza virus with other respiratory manifestations: Secondary | ICD-10-CM

## 2022-11-27 DIAGNOSIS — J9601 Acute respiratory failure with hypoxia: Secondary | ICD-10-CM | POA: Diagnosis not present

## 2022-11-27 DIAGNOSIS — B953 Streptococcus pneumoniae as the cause of diseases classified elsewhere: Secondary | ICD-10-CM | POA: Insufficient documentation

## 2022-11-27 DIAGNOSIS — A419 Sepsis, unspecified organism: Secondary | ICD-10-CM | POA: Diagnosis not present

## 2022-11-27 DIAGNOSIS — I4719 Other supraventricular tachycardia: Secondary | ICD-10-CM

## 2022-11-27 DIAGNOSIS — A403 Sepsis due to Streptococcus pneumoniae: Secondary | ICD-10-CM | POA: Diagnosis not present

## 2022-11-27 DIAGNOSIS — I5021 Acute systolic (congestive) heart failure: Secondary | ICD-10-CM | POA: Diagnosis not present

## 2022-11-27 DIAGNOSIS — R7881 Bacteremia: Secondary | ICD-10-CM | POA: Insufficient documentation

## 2022-11-27 LAB — GLUCOSE, CAPILLARY
Glucose-Capillary: 129 mg/dL — ABNORMAL HIGH (ref 70–99)
Glucose-Capillary: 113 mg/dL — ABNORMAL HIGH (ref 70–99)
Glucose-Capillary: 133 mg/dL — ABNORMAL HIGH (ref 70–99)

## 2022-11-27 LAB — COMPREHENSIVE METABOLIC PANEL
ALT: 29 U/L (ref 0–44)
AST: 69 U/L — ABNORMAL HIGH (ref 15–41)
Albumin: 2.3 g/dL — ABNORMAL LOW (ref 3.5–5.0)
Alkaline Phosphatase: 54 U/L (ref 38–126)
Anion gap: 8 (ref 5–15)
BUN: 32 mg/dL — ABNORMAL HIGH (ref 8–23)
CO2: 25 mmol/L (ref 22–32)
Calcium: 7.3 mg/dL — ABNORMAL LOW (ref 8.9–10.3)
Chloride: 106 mmol/L (ref 98–111)
Creatinine, Ser: 1.07 mg/dL (ref 0.61–1.24)
GFR, Estimated: >60 mL/min (ref >60)
Glucose, Bld: 179 mg/dL — ABNORMAL HIGH (ref 70–99)
Potassium: 3.7 mmol/L (ref 3.5–5.1)
Sodium: 139 mmol/L (ref 135–145)
Total Bilirubin: 1 mg/dL (ref 0.3–1.2)
Total Protein: 5.1 g/dL — ABNORMAL LOW (ref 6.5–8.1)

## 2022-11-27 LAB — CBC
HCT: 35.5 % — ABNORMAL LOW (ref 39.0–52.0)
Hemoglobin: 11.5 g/dL — ABNORMAL LOW (ref 13.0–17.0)
MCH: 28.4 pg (ref 26.0–34.0)
MCHC: 32.4 g/dL (ref 30.0–36.0)
MCV: 87.7 fL (ref 80.0–100.0)
Platelets: 170 10*3/uL (ref 150–400)
RBC: 4.05 MIL/uL — ABNORMAL LOW (ref 4.22–5.81)
RDW: 15 % (ref 11.5–15.5)
WBC: 13.5 10*3/uL — ABNORMAL HIGH (ref 4.0–10.5)
nRBC: 0 % (ref 0.0–0.2)

## 2022-11-27 LAB — MRSA NEXT GEN BY PCR, NASAL: MRSA by PCR Next Gen: NOT DETECTED

## 2022-11-27 LAB — HEMOGLOBIN A1C
Hgb A1c MFr Bld: 8.3 % — ABNORMAL HIGH (ref 4.8–5.6)
Mean Plasma Glucose: 192 mg/dL

## 2022-11-27 LAB — PHOSPHORUS: Phosphorus: 2.9 mg/dL (ref 2.5–4.6)

## 2022-11-27 LAB — CBG MONITORING, ED: Glucose-Capillary: 161 mg/dL — ABNORMAL HIGH (ref 70–99)

## 2022-11-27 LAB — MAGNESIUM: Magnesium: 1.9 mg/dL (ref 1.7–2.4)

## 2022-11-27 MED ORDER — DIGOXIN 125 MCG PO TABS
0.1250 mg | ORAL_TABLET | Freq: Every day | ORAL | Status: DC
  Administered 2022-11-28 – 2022-12-01 (×4): 0.125 mg via ORAL
  Filled 2022-11-27 (×4): qty 1

## 2022-11-27 MED ORDER — SODIUM CHLORIDE 0.9 % IV SOLN
2.0000 g | INTRAVENOUS | Status: AC
  Administered 2022-11-27 – 2022-12-04 (×8): 2 g via INTRAVENOUS
  Filled 2022-11-27 (×8): qty 20

## 2022-11-27 MED ORDER — IPRATROPIUM-ALBUTEROL 0.5-2.5 (3) MG/3ML IN SOLN: 3.0000 mL | Freq: Four times a day (QID) | RESPIRATORY_TRACT | Status: DC | PRN

## 2022-11-27 MED ORDER — DIGOXIN 0.25 MG/ML IJ SOLN
0.5000 mg | Freq: Once | INTRAMUSCULAR | Status: AC
  Administered 2022-11-27: 0.5 mg via INTRAVENOUS
  Filled 2022-11-27: qty 2

## 2022-11-27 MED ORDER — CHLORHEXIDINE GLUCONATE CLOTH 2 % EX PADS
6.0000 | MEDICATED_PAD | Freq: Every day | CUTANEOUS | Status: DC
  Administered 2022-11-27 – 2022-12-02 (×6): 6 via TOPICAL

## 2022-11-27 MED ORDER — DIGOXIN 0.25 MG/ML IJ SOLN
0.2500 mg | Freq: Four times a day (QID) | INTRAMUSCULAR | Status: AC
  Administered 2022-11-27 (×2): 0.25 mg via INTRAVENOUS
  Filled 2022-11-27 (×2): qty 2

## 2022-11-27 MED ORDER — MELATONIN 3 MG PO TABS
9.0000 mg | ORAL_TABLET | Freq: Every evening | ORAL | Status: DC | PRN
  Administered 2022-11-27 – 2022-12-04 (×8): 9 mg via ORAL
  Filled 2022-11-27 (×8): qty 3

## 2022-11-27 NOTE — Evaluation (Addendum)
Clinical/Bedside Swallow Evaluation Patient Details  Name: Henry [name] MRN: [id number] Date of Birth: Jul 01, 1951  Today's Date: 11/27/2022 Time: SLP Start Time (ACUTE ONLY): 1437 SLP Stop Time (ACUTE ONLY): 6629 SLP Time Calculation (min) (ACUTE ONLY): 40 min  Past Medical History:  Past Medical History:  Diagnosis Date   Diabetes ([place])    GERD (gastroesophageal reflux disease)    Mental disability    Past Surgical History:  Past Surgical History:  Procedure Laterality Date   ANKLE FRACTURE SURGERY     pin   BALLOON DILATION N/A 12/30/2021   Procedure: BALLOON DILATION;  Surgeon: Eloise Harman, DO;  [facility];  Service: Endoscopy;  Laterality: N/A;   cervical fracture     wore halo brace   COLONOSCOPY  05/18/2011   Rourk; Left sided diverticula, otherwise normal exam.  Repeat in 10 years.   COLONOSCOPY WITH PROPOFOL N/A 12/30/2021   Procedure: COLONOSCOPY WITH PROPOFOL;  Surgeon: Eloise Harman, DO;  [facility]: [facility];  Service: Endoscopy;  Laterality: N/A;  10:45am   ESOPHAGOGASTRODUODENOSCOPY  10/30/2005   distal esophageal narrowing s/p dilation up to 58m. ?achalasia?   ESOPHAGOGASTRODUODENOSCOPY  05/18/2011   Rourk; Submucosal ring at GE junction s/p dilation, hiatal hernia, normal examined stomach, questionable scalloping at D2 and D3 s/p biopsy.  Pathology with chronic duodenitis consistent with peptic duodenitis.   ESOPHAGOGASTRODUODENOSCOPY  10/2019   Dr. DStaci Righter moderate dilation of upper third of the esophagus and middle third of the esophagus s/p biopsied, otherwise normal exam.  Recommended considering manometry to rule out achalasia versus BPE. No dilation performed.   ESOPHAGOGASTRODUODENOSCOPY  11/28/2019   Dr. BJill Alexanders Moderate dilation of upper and middle third of the esophagus s/p biopsied, no dilatable stricture seen, normal examined stomach and duodenum. Pathology was benign.   ESOPHAGOGASTRODUODENOSCOPY (EGD) WITH PROPOFOL N/A  12/30/2021   Procedure: ESOPHAGOGASTRODUODENOSCOPY (EGD) WITH PROPOFOL;  Surgeon: CEloise Harman DO;  [facility];  Service: Endoscopy;  Laterality: N/A;   POLYPECTOMY  12/30/2021   Procedure: POLYPECTOMY;  Surgeon: CEloise Harman DO;  [facility];  Service: Endoscopy;;   TONSILLECTOMY     HPI:  71year old male with a history of hearing impairment, esophageal dysphagia, GERD, diabetes mellitus type 2 presenting with fever, coughing, and vomiting for the last 2 to 3 days. Influenza PCR was positive.  The patient became increasingly hypoxemic requiring high flow nasal cannula up to 15 L. Pt with known hx of esophageal dysphagia and most recent upper GI endoscopy on 12/30/21 with report noting: "Dilation in the upper third of the esophagus and in the middle third of the esophagus.  - The examination was suspicious for achalasia. Dilated." Pt was reportedly vomiting after all PO in the ED and BSE was requested.    Assessment / Plan / Recommendation  Clinical Impression  Clinical swallowing evaluation completed while Pt was sitting upright in bed; Pt's sister (primary caregiver) was present for evaluation. She reports Pt has a lengthy hx of esophageal problems and has most recently been followed by Dr. CAbbey Chatterswho per chart review most recently completed esophageal dilation on Pt during upper GI endoscopy in January of 2023. Pt's sister reports he is very impulsive while eating/drinking and has a tendency to get "choked". Pt consumed thin liquids initially swallowing without difficulty, however note belching following every sip. Pt consumed puree and regular textures without overt s/sx of aspiration on initial swallows however ? primary esophageal dysphagia per s/sx observed  including belching/bubbly sounds, globus sensation and reports of regurgitation. Recommend GI consult; However, Note Pt did report to me "I don't want no more tubes down my throat". Pt may require additional  education from GI. ST will continue to follow acutely; from an oropharyngeal standpoint recommend initiate D3/mech soft diet with thin liquids - however defer to Attending and/or GI to upgrade diet from clear liquids when medically appropriate. Recommend esophageal precautions including: sit upright, small bites and sips, only consume small amounts at a time. Above reviewed with Pt, RN and Pt's sister. Thank you,   SLP Visit Diagnosis: Dysphagia, unspecified (R13.10)    Aspiration Risk  Mild aspiration risk;Moderate aspiration risk;Risk for inadequate nutrition/hydration    Diet Recommendation Dysphagia 3 (Mech soft);Thin liquid   Liquid Administration via: Cup;Straw Medication Administration: Whole meds with puree Supervision: Staff to assist with self feeding Compensations: Minimize environmental distractions;Slow rate;Small sips/bites Postural Changes: Seated upright at 90 degrees    Other  Recommendations Recommended Consults: Consider GI evaluation;Consider esophageal assessment Oral Care Recommendations: Oral care BID    Recommendations for follow up therapy are one component of a multi-disciplinary discharge planning process, led by the attending physician.  Recommendations may be updated based on patient status, additional functional criteria and insurance authorization.           Frequency and Duration min 2x/week  1 week       Prognosis Prognosis for Safe Diet Advancement: Fair Barriers to Reach Goals: Cognitive deficits      Swallow Study   General HPI: 71 year old male with a history of hearing impairment, esophageal dysphagia, GERD, diabetes mellitus type 2 presenting with fever, coughing, and vomiting for the last 2 to 3 days. Influenza PCR was positive.  The patient became increasingly hypoxemic requiring high flow nasal cannula up to 15 L. Pt with known hx of esophageal dysphagia and most recent upper GI endoscopy on 12/30/21 with report noting: "Dilation in the upper  third of the esophagus and in the middle third of the esophagus.  - The examination was suspicious for achalasia. Dilated." Pt was reportedly vomiting after all PO in the ED and BSE was requested. Type of Study: Bedside Swallow Evaluation Previous Swallow Assessment: OP clinical swallowing evaluation completed in 2021 Diet Prior to this Study: NPO Temperature Spikes Noted: No Respiratory Status: Nasal cannula History of Recent Intubation: No Behavior/Cognition: Alert;Cooperative;Pleasant mood Oral Cavity Assessment: Within Functional Limits Oral Care Completed by SLP: Recent completion by staff Oral Cavity - Dentition: Dentures, top;Dentures, bottom Vision: Functional for self-feeding Self-Feeding Abilities: Able to feed self;Needs assist Patient Positioning: Upright in bed Baseline Vocal Quality: Normal Volitional Cough: Strong Volitional Swallow: Able to elicit    Oral/Motor/Sensory Function Overall Oral Motor/Sensory Function: Within functional limits   Ice Chips Ice chips: Within functional limits Presentation: Cup   Thin Liquid Thin Liquid: Within functional limits Presentation: Cup;Straw    Nectar Thick Nectar Thick Liquid: Not tested   Honey Thick Honey Thick Liquid: Not tested   Puree Puree: Within functional limits   Solid     Solid: Within functional limits     Daneka Lantigua H. Roddie Mc, CCC-SLP Speech Language Pathologist  Wende Bushy 11/27/2022,3:17 PM

## 2022-11-27 NOTE — Progress Notes (Signed)
  X-cover Note: Bedside RN noting rapid afib. Initially admitted for influenza A with hypoxia. Blood cx growing Strep pneumonia. LVEF 25%. On HFNC @ 12 L/min.  Cannot start cardizem due to low EF. Rather not give betablockers due to respiratory failure and potentially to cause more wheezing.  Scr 0.81 and GFR >60. Will load with IV digoxin.   Kristopher Oppenheim, DO [facility]

## 2022-11-27 NOTE — ED Notes (Signed)
Patient given bed bath at this time due to vomiting and diaphoresis. Patient's linen and gown change with sacraal pad applied to due to erythema to sacrum.

## 2022-11-27 NOTE — ED Notes (Addendum)
Patient confused and attempting to remove male wick and pee inside emesis bag. Patient was given water and coughed immediately after sipping and had one episode of vomiting while sitting up. Dr. Carles Collet made aware   Brother at the bedside to assist patient.

## 2022-11-27 NOTE — Consult Note (Signed)
CARDIOLOGY CONSULT NOTE       Patient ID: Henry [name] MRN: [id number] DOB/AGE: 72-13-1952 72 y.o.  Admit date: 11/25/2022 Referring Physician: Tat Primary Physician: Curlene Labrum, MD Primary Cardiologist: none Reason for Consultation: Abnormal Echo   Principal Problem:   Influenza A Active Problems:   GERD (gastroesophageal reflux disease)   Sepsis, viral (HCC)   Lactic acidosis   Uncontrolled type 2 diabetes mellitus with hyperglycemia, without long-term current use of insulin (HCC)   Acute respiratory failure with hypoxia (HCC)   HPI:  71 y.o. seen for Dr Tat for abnormal Echo Read by myself yesterday with global hypokinesis EF 25-30% Suggestion of elevated PA pressures and no significant valve disease. Patient admitted 11/25/22 with cough fever and vomiting Influenza A positive Sasts 72% Multiple family members were sick Most of history from brother he just arrived from [place]. LA up 4.4 WBC normal CXR with bilateral lower lobe infiltrates procalcitionin elevated 9.3 BNP elevated 2649.  ECG with short period of atrial tachycardia then SR with PAC. No troponins done This am breathing better has diuresed some and BP soft Been Rx with Unasyn and Zithromax On digoxin and lasix GDMT hindered by low BP   ROS All other systems reviewed and negative except as noted above  Past Medical History:  Diagnosis Date   Diabetes ([place])    GERD (gastroesophageal reflux disease)    Mental disability     Family History  Problem Relation Age of Onset   Colon cancer Neg Hx     Social History   Socioeconomic History   Marital status: Single    Spouse name: Not on file   Number of children: 0   Years of education: Not on file   Highest education level: Not on file  Occupational History   Occupation: unemployed  Tobacco Use   Smoking status: Never   Smokeless tobacco: Former    Types: Snuff   Tobacco comments:    uses dip  Vaping Use   Vaping Use: Never used  Substance  and Sexual Activity   Alcohol use: Not Currently   Drug use: No   Sexual activity: Not on file  Other Topics Concern   Not on file  Social History Narrative   Lives with sister.   Social Determinants of Health   Financial Resource Strain: Not on file  Food Insecurity: Not on file  Transportation Needs: Not on file  Physical Activity: Not on file  Stress: Not on file  Social Connections: Not on file  Intimate Partner Violence: Not on file    Past Surgical History:  Procedure Laterality Date   ANKLE FRACTURE SURGERY     pin   BALLOON DILATION N/A 12/30/2021   Procedure: BALLOON DILATION;  Surgeon: Eloise Harman, DO;  [facility];  Service: Endoscopy;  Laterality: N/A;   cervical fracture     wore halo brace   COLONOSCOPY  05/18/2011   Rourk; Left sided diverticula, otherwise normal exam.  Repeat in 10 years.   COLONOSCOPY WITH PROPOFOL N/A 12/30/2021   Procedure: COLONOSCOPY WITH PROPOFOL;  Surgeon: Eloise Harman, DO;  [facility]: [facility];  Service: Endoscopy;  Laterality: N/A;  10:45am   ESOPHAGOGASTRODUODENOSCOPY  10/30/2005   distal esophageal narrowing s/p dilation up to 65m. ?achalasia?   ESOPHAGOGASTRODUODENOSCOPY  05/18/2011   Rourk; Submucosal ring at GE junction s/p dilation, hiatal hernia, normal examined stomach, questionable scalloping at D2 and D3 s/p biopsy.  Pathology with chronic duodenitis  consistent with peptic duodenitis.   ESOPHAGOGASTRODUODENOSCOPY  10/2019   Dr. Staci Righter; moderate dilation of upper third of the esophagus and middle third of the esophagus s/p biopsied, otherwise normal exam.  Recommended considering manometry to rule out achalasia versus BPE. No dilation performed.   ESOPHAGOGASTRODUODENOSCOPY  11/28/2019   Dr. Jill Alexanders; Moderate dilation of upper and middle third of the esophagus s/p biopsied, no dilatable stricture seen, normal examined stomach and duodenum. Pathology was benign.   ESOPHAGOGASTRODUODENOSCOPY (EGD)  WITH PROPOFOL N/A 12/30/2021   Procedure: ESOPHAGOGASTRODUODENOSCOPY (EGD) WITH PROPOFOL;  Surgeon: Eloise Harman, DO;  [facility];  Service: Endoscopy;  Laterality: N/A;   POLYPECTOMY  12/30/2021   Procedure: POLYPECTOMY;  Surgeon: Eloise Harman, DO;  [facility]: [facility];  Service: Endoscopy;;   TONSILLECTOMY        Current Facility-Administered Medications:    acetaminophen (TYLENOL) tablet 650 mg, 650 mg, Oral, Q6H PRN, 650 mg at 11/27/22 0322 **OR** acetaminophen (TYLENOL) suppository 650 mg, 650 mg, Rectal, Q6H PRN, Kristopher Oppenheim, DO, 650 mg at 11/26/22 1440   Ampicillin-Sulbactam (UNASYN) 3 g in sodium chloride 0.9 % 100 mL IVPB, 3 g, Intravenous, Q6H, Tat, Shanon Brow, MD, Stopped at 11/27/22 0757   azithromycin (ZITHROMAX) 500 mg in sodium chloride 0.9 % 250 mL IVPB, 500 mg, Intravenous, Q24H, Tat, Shanon Brow, MD, Last Rate: 250 mL/hr at 11/27/22 0821, 500 mg at 11/27/22 7846   [COMPLETED] digoxin (LANOXIN) 0.25 MG/ML injection 0.5 mg, 0.5 mg, Intravenous, Once, 0.5 mg at 11/27/22 0359 **FOLLOWED BY** digoxin (LANOXIN) 0.25 MG/ML injection 0.25 mg, 0.25 mg, Intravenous, Q6H **FOLLOWED BY** [START ON 11/28/2022] digoxin (LANOXIN) tablet 0.125 mg, 0.125 mg, Oral, Daily, Kristopher Oppenheim, DO   heparin injection 5,000 Units, 5,000 Units, Subcutaneous, Q8H, Kristopher Oppenheim, DO, 5,000 Units at 11/27/22 0620   insulin aspart (novoLOG) injection 0-5 Units, 0-5 Units, Subcutaneous, QHS, Bridgett Larsson, Eric, DO   insulin aspart (novoLOG) injection 0-9 Units, 0-9 Units, Subcutaneous, TID WC, Kristopher Oppenheim, DO, 2 Units at 11/27/22 0819   ipratropium-albuterol (DUONEB) 0.5-2.5 (3) MG/3ML nebulizer solution 3 mL, 3 mL, Nebulization, Q6H PRN, Kristopher Oppenheim, DO   melatonin tablet 9 mg, 9 mg, Oral, QHS PRN, Kristopher Oppenheim, DO, 9 mg at 11/27/22 0321   ondansetron (ZOFRAN) injection 4 mg, 4 mg, Intravenous, Once, Kristopher Oppenheim, DO   ondansetron [facility]) tablet 4 mg, 4 mg, Oral, Q6H PRN **OR** ondansetron (ZOFRAN) injection 4 mg, 4  mg, Intravenous, Q6H PRN, Kristopher Oppenheim, DO   oseltamivir (TAMIFLU) capsule 75 mg, 75 mg, Oral, BID, Tat, David, MD, 75 mg at 11/27/22 0108   pantoprazole (PROTONIX) injection 40 mg, 40 mg, Intravenous, Q24H, Kristopher Oppenheim, DO, 40 mg at 11/27/22 0820  Current Outpatient Medications:    acetaminophen (TYLENOL) 500 MG tablet, Take 1,000 mg by mouth every 6 (six) hours as needed for fever., Disp: , Rfl:    cetirizine (ZYRTEC) 10 MG tablet, Take 10 mg by mouth daily., Disp: , Rfl:    Chlorpheniramine-Acetaminophen (CORICIDIN HBP COLD/FLU PO), Take 1 tablet by mouth as needed (cough)., Disp: , Rfl:    diphenhydramine-acetaminophen (TYLENOL PM) 25-500 MG TABS, Take 1 tablet by mouth at bedtime as needed (pain/sleep)., Disp: , Rfl:    metFORMIN (GLUCOPHAGE) 500 MG tablet, Take 500 mg by mouth 2 (two) times daily., Disp: , Rfl:    Multiple Vitamins-Minerals (MULTIVITAMIN WITH MINERALS) tablet, Take 1 tablet by mouth daily., Disp: , Rfl:    omeprazole (PRILOSEC) 20 MG capsule, Take 20 mg by mouth  daily.  , Disp: , Rfl:    sertraline (ZOLOFT) 25 MG tablet, Take 25 mg by mouth daily., Disp: , Rfl:    albuterol (VENTOLIN HFA) 108 (90 Base) MCG/ACT inhaler, Inhale 2 puffs into the lungs every 6 (six) hours as needed for wheezing or shortness of breath. (Patient not taking: Reported on 11/25/2022), Disp: 8 g, Rfl: 2  digoxin  0.25 mg Intravenous Q6H   Followed by   Derrill Memo ON 11/28/2022] digoxin  0.125 mg Oral Daily   heparin  5,000 Units Subcutaneous Q8H   insulin aspart  0-5 Units Subcutaneous QHS   insulin aspart  0-9 Units Subcutaneous TID WC   ondansetron  4 mg Intravenous Once   oseltamivir  75 mg Oral BID   pantoprazole (PROTONIX) IV  40 mg Intravenous Q24H    ampicillin-sulbactam (UNASYN) IV Stopped (11/27/22 0757)   azithromycin 500 mg (11/27/22 7106)    Physical Exam: Blood pressure 106/73, pulse (!) 59, temperature 99.1 F (37.3 C), temperature source Axillary, resp. rate (!) 36, height '5\' 11"'$   (1.803 m), weight 91 kg, SpO2 97 %.    Elderly male Poor hearing  Crackles at base No murmur Abdomen benign Plus one edema   Labs:   Lab Results  Component Value Date   WBC 13.5 (H) 11/27/2022   HGB 11.5 (L) 11/27/2022   HCT 35.5 (L) 11/27/2022   MCV 87.7 11/27/2022   PLT 170 11/27/2022    Recent Labs  Lab 11/27/22 0534  NA 139  K 3.7  CL 106  CO2 25  BUN 32*  CREATININE 1.07  CALCIUM 7.3*  PROT 5.1*  BILITOT 1.0  ALKPHOS 54  ALT 29  AST 69*  GLUCOSE 179*   No results found for: "CKTOTAL", "CKMB", "CKMBINDEX", "TROPONINI" No results found for: "CHOL" No results found for: "HDL" No results found for: "LDLCALC" No results found for: "TRIG" No results found for: "CHOLHDL" No results found for: "LDLDIRECT"    Radiology: ECHOCARDIOGRAM COMPLETE  Result Date: 11/26/2022    ECHOCARDIOGRAM REPORT   Patient Name:   Henry [name] Date of Exam: 11/26/2022 Medical Rec #:  [id number]    Height:       71.0 in Accession #:    [id number]   Weight:       200.6 lb Date of Birth:  10/16/1951    BSA:          2.111 m Patient Age:    29 years     BP:           103/64 mmHg Patient Gender: M            HR:           102 bpm. Exam Location:  [facility] Procedure: 2D Echo, Cardiac Doppler and Color Doppler Indications:    CHF-Acute Diastolic W29.93  History:        Patient has no prior history of Echocardiogram examinations.                 Risk Factors:Diabetes. Acute respiratory failure with hypoxia                 and Positive for FLU A at present, Mental disability (From Hx).  Sonographer:    Alvino Chapel RCS Referring Phys: [phone] DAVID TAT  Sonographer Comments: Patient could NOT follow instructions to "sniff". IMPRESSIONS  1. Global hypokinesis worse in the inferior base . Left ventricular ejection fraction, by estimation, is 25 to 30%. The left  ventricle has severely decreased function. The left ventricle demonstrates global hypokinesis. The left ventricular internal cavity size was  moderately dilated. Left ventricular diastolic parameters are indeterminate.  2. Some septal flattening suggesting elevated PA pressure TR velocity inadequate to estimate . Right ventricular systolic function is mildly reduced. The right ventricular size is mildly enlarged.  3. Left atrial size was moderately dilated.  4. The mitral valve is normal in structure. Trivial mitral valve regurgitation. No evidence of mitral stenosis.  5. The aortic valve is tricuspid. Aortic valve regurgitation is not visualized. No aortic stenosis is present.  6. The inferior vena cava is dilated in size with >50% respiratory variability, suggesting right atrial pressure of 8 mmHg. FINDINGS  Left Ventricle: Global hypokinesis worse in the inferior base. Left ventricular ejection fraction, by estimation, is 25 to 30%. The left ventricle has severely decreased function. The left ventricle demonstrates global hypokinesis. Definity contrast agent was given IV to delineate the left ventricular endocardial borders. The left ventricular internal cavity size was moderately dilated. There is no left ventricular hypertrophy. Left ventricular diastolic parameters are indeterminate. Right Ventricle: Some septal flattening suggesting elevated PA pressure TR velocity inadequate to estimate. The right ventricular size is mildly enlarged. No increase in right ventricular wall thickness. Right ventricular systolic function is mildly reduced. Left Atrium: Left atrial size was moderately dilated. Right Atrium: Right atrial size was normal in size. Pericardium: There is no evidence of pericardial effusion. Mitral Valve: The mitral valve is normal in structure. Trivial mitral valve regurgitation. No evidence of mitral valve stenosis. Tricuspid Valve: The tricuspid valve is normal in structure. Tricuspid valve regurgitation is mild . No evidence of tricuspid stenosis. Aortic Valve: The aortic valve is tricuspid. Aortic valve regurgitation is not visualized.  No aortic stenosis is present. Pulmonic Valve: The pulmonic valve was normal in structure. Pulmonic valve regurgitation is trivial. No evidence of pulmonic stenosis. Aorta: The aortic root is normal in size and structure. Venous: The inferior vena cava is dilated in size with greater than 50% respiratory variability, suggesting right atrial pressure of 8 mmHg. IAS/Shunts: No atrial level shunt detected by color flow Doppler.  LEFT VENTRICLE PLAX 2D LVIDd:         5.60 cm      Diastology LVIDs:         4.60 cm      LV e' medial:    6.64 cm/s LV PW:         1.00 cm      LV E/e' medial:  12.2 LV IVS:        1.10 cm      LV e' lateral:   8.27 cm/s LVOT diam:     2.00 cm      LV E/e' lateral: 9.8 LV SV:         52 LV SV Index:   25 LVOT Area:     3.14 cm  LV Volumes (MOD) LV vol d, MOD A2C: 116.0 ml LV vol d, MOD A4C: 126.0 ml LV vol s, MOD A2C: 92.4 ml LV vol s, MOD A4C: 81.5 ml LV SV MOD A2C:     23.6 ml LV SV MOD A4C:     126.0 ml LV SV MOD BP:      37.7 ml RIGHT VENTRICLE RV S prime:     12.65 cm/s TAPSE (M-mode): 1.5 cm LEFT ATRIUM             Index  RIGHT ATRIUM           Index LA diam:        4.40 cm 2.08 cm/m   RA Area:     24.70 cm LA Vol (A2C):   70.4 ml 33.34 ml/m  RA Volume:   82.00 ml  38.84 ml/m LA Vol (A4C):   65.2 ml 30.88 ml/m LA Biplane Vol: 74.5 ml 35.29 ml/m  AORTIC VALVE LVOT Vmax:   97.60 cm/s LVOT Vmean:  74.900 cm/s LVOT VTI:    0.166 m  AORTA Ao Root diam: 3.50 cm MITRAL VALVE               TRICUSPID VALVE MV Area (PHT): 4.94 cm    TR Peak grad:   40.7 mmHg MV Decel Time: 154 msec    TR Vmax:        319.00 cm/s MV E velocity: 80.70 cm/s MV A velocity: 53.30 cm/s  SHUNTS MV E/A ratio:  1.51        Systemic VTI:  0.17 m                            Systemic Diam: 2.00 cm Jenkins Rouge MD Electronically signed by Jenkins Rouge MD Signature Date/Time: 11/26/2022/4:51:55 PM    Final    DG Chest Port 1 View  Result Date: 11/25/2022 CLINICAL DATA:  Shortness of breath. EXAM: PORTABLE  CHEST 1 VIEW COMPARISON:  Chest radiograph dated 04/15/2022. FINDINGS: Bilateral mid to lower lung field confluent nodular densities most consistent with developing infiltrate. Trace left pleural effusion suspected. No pneumothorax. Stable cardiomegaly. No acute osseous pathology. IMPRESSION: Bilateral mid to lower lung field developing infiltrate. Electronically Signed   By: Anner Crete M.D.   On: 11/25/2022 19:19    EKG: See HPI   ASSESSMENT AND PLAN:   DCM:  in setting of sepsis and Influenza A. Doubt ischemic DCM Continue dig and lasix. Try to start low dose ARB Cozaar 25 mg over weekend as BP tolerates No beta blocker with acute CHF. Sats better Add amiodarone if he has more atrial tachycardia but NSR this am in ER Note patient is DNR Should check troponin x 2 given low EF  Sepsis: covered with antibiotics not on Tamiflu supplemental oxygen and Duoneb  Signed: Jenkins Rouge 11/27/2022, 9:05 AM

## 2022-11-27 NOTE — Progress Notes (Signed)
PROGRESS NOTE  Henry [name] QGB:[id number] DOB: 1951/06/09 DOA: 11/25/2022 PCP: Curlene Labrum, MD  Brief History:  71 year old male with a history of hearing impairment, esophageal dysphagia, GERD, diabetes mellitus type 2 presenting with fever, coughing, and vomiting for the last 2 to 3 days.  He has had increasing shortness of breath.  Apparently multiple family members have been sick. Reportedly patient lives with his sister. Caregiver is not here in the ER now.   Bedside RN states that family members took the patient's hearing aids home.  Patient has severe hearing loss.  Difficult to communicate.   In the ED, the patient was febrile up to 101.2 F with soft blood pressure down to 90/62.  Oxygen saturation was 85% room air. WBC 7.4, hemoglobin 12.9, platelets 171,000.  BMP showed sodium 130, potassium 3.9, bicarbonate 25, serum creatinine 0.79.  AST 71, ALT 32, alk phosphatase 84, total bilirubin 1.2.  Lactic acid peaked at 4.6.  The patient was started on oseltamavir.  Influenza PCR was positive. The patient became increasingly hypoxemic requiring high flow nasal cannula up to 15 L.   Assessment/Plan: Severe Sepsis -present on admission -fever, tachycardia, AMS, resp failure -due to bacteremia, pneumonia -PCT 9.33  Streptococcus pneumonia bacteremia -source = pneumonia -personally reviewed CXR--bilateral patchy opacities R>L -continue Unasyn  Lobar Pneumonia -there is a component of aspiration pneumonitis -continue unasyn  Acute respiratory failure with hypoxia -11/26/22 ABG 7.4 2/37/188/24 (15L) -Secondary to influenza, pulm edema, pneumonia -Concerned about aspiration pneumonitis with a history of esophageal dysphagia and vomiting -Wean oxygen as tolerated -The patient does have signs of volume overload -BNP 2649 -Saline lock IV fluids  Acute HFrEF -12/28 Echo--EF 35-30% -continue IV lasix -appreciate cardiology -low BP limiting GDMP presently>>initiate  gradually as BP improves  Atrial tachycardia -personally reviewed EKG>>atrial tach -continue digoxin for now -if recurs, may need amiodarone   Influenza A -continue oseltamavir -Check PCT 9.33 -Clear liquid diet for now given the patient's vomiting   Esophageal dysphagia -Given the patient's history of this and respiratory failure, concern about aspiration pneumonitis -Start empiric Unasyn   Lactic acidosis -Primarily driven by the patient's hypoxia   Diabetes mellitus type 2 with hyperglycemia -Check A1c -NovoLog sliding scale -Holding metformin   GERD -Continue pantoprazole   Hypomagnesemia -replete           Family Communication:   brother/sister updated 12/29 Consultants:  cardiology   Code Status:  DNR   DVT Prophylaxis:  [place] Heparin     Procedures: As Listed in Progress Note Above   Antibiotics: Unasyn 12/28>> Azithro 12/28>>           Subjective: Pt confused, intermittently agitated.  Denies cp, n/v/d, abd pain.  ROS limited by confusion  Objective: Vitals:   11/27/22 0645 11/27/22 0700 11/27/22 0730 11/27/22 0900  BP: (!) 132/52 (!) 91/53 106/67 106/73  Pulse: 86 84 76 (!) 59  Resp: 17 (!) 24 (!) 28 (!) 36  Temp:      TempSrc:      SpO2: 99% 99% 98% 97%  Weight:      Height:        Intake/Output Summary (Last 24 hours) at 11/27/2022 1040 Last data filed at 11/27/2022 0943 Gross per 24 hour  Intake 550.1 ml  Output 800 ml  Net -249.9 ml   Weight change:  Exam:  General:  Pt is alert, intermittently follows commands appropriately, not in acute distress HEENT: No  icterus, No thrush, No neck mass, [place]/AT Cardiovascular: RRR, S1/S2, no rubs, no gallops Respiratory: bilateral crackles. No wheeze Abdomen: Soft/+BS, non tender, non distended, no guarding Extremities: trace LE edema, No lymphangitis, No petechiae, No rashes, no synovitis   Data Reviewed: I have personally reviewed following labs and imaging studies Basic  Metabolic Panel: Recent Labs  Lab 11/25/22 1952 11/26/22 0450 11/27/22 0534  NA 138 140 139  K 3.9 4.2 3.7  CL 101 107 106  CO2 _0 GLUCOSE 205* 166* 179*  BUN 16 19 32*  CREATININE 0.79 0.81 1.07  CALCIUM 8.6* 8.0* 7.3*  MG  --  1.6* 1.9  PHOS  --   --  2.9   Liver Function Tests: Recent Labs  Lab 11/25/22 1952 11/26/22 0450 11/27/22 0534  AST 71* 79* 69*  ALT 32 34 29  ALKPHOS 84 72 54  BILITOT 1.2 0.8 1.0  PROT 7.3 6.1* 5.1*  ALBUMIN 3.5 3.0* 2.3*   No results for input(s): "LIPASE", "AMYLASE" in the last 168 hours. No results for input(s): "AMMONIA" in the last 168 hours. Coagulation Profile: Recent Labs  Lab 11/25/22 1952  INR 1.3*   CBC: Recent Labs  Lab 11/25/22 1952 11/26/22 0450 11/27/22 0534  WBC 7.4 12.4* 13.5*  NEUTROABS 6.2 9.4*  --   HGB 12.9* 12.5* 11.5*  HCT 40.2 38.9* 35.5*  MCV 88.4 89.4 87.7  PLT 171 165 170   Cardiac Enzymes: No results for input(s): "CKTOTAL", "CKMB", "CKMBINDEX", "TROPONINI" in the last 168 hours. BNP: Invalid input(s): "POCBNP" CBG: Recent Labs  Lab 11/26/22 0812 11/26/22 1137 11/26/22 1642 11/26/22 2310 11/27/22 0755  GLUCAP 164* 104* 145* 132* 161*   HbA1C: Recent Labs    11/25/22 1952  HGBA1C 8.3*   Urine analysis:    Component Value Date/Time   COLORURINE AMBER (A) 11/26/2022 0108   APPEARANCEUR HAZY (A) 11/26/2022 0108   LABSPEC 1.025 11/26/2022 0108   PHURINE 5.0 11/26/2022 0108   GLUCOSEU 50 (A) 11/26/2022 0108   HGBUR NEGATIVE 11/26/2022 0108   BILIRUBINUR NEGATIVE 11/26/2022 0108   KETONESUR 5 (A) 11/26/2022 0108   PROTEINUR >=300 (A) 11/26/2022 0108   NITRITE NEGATIVE 11/26/2022 0108   LEUKOCYTESUR NEGATIVE 11/26/2022 0108   Sepsis Labs: _1 (procalcitonin:4,lacticidven:4) ) Recent Results (from the past 240 hour(s))  Culture, blood (Routine x 2)     Status: Abnormal (Preliminary result)   Collection Time: 11/25/22  7:52 PM   Specimen: BLOOD  Result Value Ref  Range Status   Specimen Description   Final    BLOOD LEFT ANTECUBITAL Performed at [facility], [address], [place], [place] [address]    Special Requests   Final    BOTTLES DRAWN AEROBIC AND ANAEROBIC Blood Culture adequate volume Performed at [facility], [address], [place], [place] [address]    Culture  Setup Time   Final    GRAM POSITIVE COCCI BOTTLES DRAWN AEROBIC AND ANAEROBIC Gram Stain Report Called to,Read Back By and Verified With: BASS,V _2  BY MATTHEWS, B 12.28.2023 CRITICAL RESULT CALLED TO, READ BACK BY AND VERIFIED WITH: PHARMD STEVEN [place] [id number] JRS    Culture (A)  Final    STREPTOCOCCUS PNEUMONIAE SUSCEPTIBILITIES TO FOLLOW Performed at [facility], [place] [address], [place],  [address]    Report Status PENDING  Incomplete  Blood Culture ID Panel (Reflexed)     Status: Abnormal   Collection Time: 11/25/22  7:52 PM  Result Value Ref Range Status  Enterococcus faecalis NOT DETECTED NOT DETECTED Final   Enterococcus Faecium NOT DETECTED NOT DETECTED Final   Listeria monocytogenes NOT DETECTED NOT DETECTED Final   Staphylococcus species NOT DETECTED NOT DETECTED Final   Staphylococcus aureus (BCID) NOT DETECTED NOT DETECTED Final   Staphylococcus epidermidis NOT DETECTED NOT DETECTED Final   Staphylococcus lugdunensis NOT DETECTED NOT DETECTED Final   Streptococcus species DETECTED (A) NOT DETECTED Final    Comment: PHARMD STEVEN HURSH [id number] JRS   Streptococcus agalactiae NOT DETECTED NOT DETECTED Final   Streptococcus pneumoniae DETECTED (A) NOT DETECTED Final    Comment: PHARMD STEVEN Jansen [id number] JRS   Streptococcus pyogenes NOT DETECTED NOT DETECTED Final   A.calcoaceticus-baumannii NOT DETECTED NOT DETECTED Final   Bacteroides fragilis NOT DETECTED NOT DETECTED Final   Enterobacterales NOT DETECTED NOT DETECTED Final   Enterobacter cloacae complex NOT DETECTED NOT DETECTED Final   Escherichia coli NOT DETECTED NOT  DETECTED Final   Klebsiella aerogenes NOT DETECTED NOT DETECTED Final   Klebsiella oxytoca NOT DETECTED NOT DETECTED Final   Klebsiella pneumoniae NOT DETECTED NOT DETECTED Final   Proteus species NOT DETECTED NOT DETECTED Final   Salmonella species NOT DETECTED NOT DETECTED Final   Serratia marcescens NOT DETECTED NOT DETECTED Final   Haemophilus influenzae NOT DETECTED NOT DETECTED Final   Neisseria meningitidis NOT DETECTED NOT DETECTED Final   Pseudomonas aeruginosa NOT DETECTED NOT DETECTED Final   Stenotrophomonas maltophilia NOT DETECTED NOT DETECTED Final   Candida albicans NOT DETECTED NOT DETECTED Final   Candida auris NOT DETECTED NOT DETECTED Final   Candida glabrata NOT DETECTED NOT DETECTED Final   Candida krusei NOT DETECTED NOT DETECTED Final   Candida parapsilosis NOT DETECTED NOT DETECTED Final   Candida tropicalis NOT DETECTED NOT DETECTED Final   Cryptococcus neoformans/gattii NOT DETECTED NOT DETECTED Final    Comment: Performed at [facility], [address] [place], [place] [address]  Culture, blood (Routine x 2)     Status: None (Preliminary result)   Collection Time: 11/25/22  7:53 PM   Specimen: BLOOD  Result Value Ref Range Status   Specimen Description   Final    BLOOD RIGHT ANTECUBITAL Performed at [facility], [address], [place], [place] [address]    Special Requests   Final    BOTTLES DRAWN AEROBIC AND ANAEROBIC Blood Culture adequate volume Performed at [facility], [address], [place], [place] [address]    Culture  Setup Time   Final    GRAM POSITIVE COCCI BOTTLES DRAWN AEROBIC AND ANAEROBIC Gram Stain Report Called to,Read Back By and Verified With: BASS,V_0  BY MATTHEWS,B 12.28.2023 IN BOTH AEROBIC AND ANAEROBIC BOTTLES Performed at [facility], [place] [address] [place], [place] [address]    Culture GRAM POSITIVE COCCI  Final   Report Status PENDING  Incomplete  Resp panel by RT-PCR (RSV, Flu A&B, Covid) Anterior Nasal  Swab     Status: Abnormal   Collection Time: 11/25/22  8:05 PM   Specimen: Anterior Nasal Swab  Result Value Ref Range Status   SARS Coronavirus 2 by RT PCR NEGATIVE NEGATIVE Final    Comment: (NOTE) SARS-CoV-2 target nucleic acids are NOT DETECTED.  The SARS-CoV-2 RNA is generally detectable in upper respiratory specimens during the acute phase of infection. The lowest concentration of SARS-CoV-2 viral copies this assay can detect is 138 copies/mL. A negative result does not preclude SARS-Cov-2 infection and should not be used as the sole basis  for treatment or other patient management decisions. A negative result may occur with  improper specimen collection/handling, submission of specimen other than nasopharyngeal swab, presence of viral mutation(s) within the areas targeted by this assay, and inadequate number of viral copies(<138 copies/mL). A negative result must be combined with clinical observations, patient history, and epidemiological information. The expected result is Negative.  Fact Sheet for Patients:  EntrepreneurPulse.com.au  Fact Sheet for Healthcare Providers:  IncredibleEmployment.be  This test is no t yet approved or cleared by the [place] FDA and  has been authorized for detection and/or diagnosis of SARS-CoV-2 by FDA under an Emergency Use Authorization (EUA). This EUA will remain  in effect (meaning this test can be used) for the duration of the COVID-19 declaration under Section 564(b)(1) of the Act, 21 U.S.C.section 360bbb-3(b)(1), unless the authorization is terminated  or revoked sooner.       Influenza A by PCR POSITIVE (A) NEGATIVE Final   Influenza B by PCR NEGATIVE NEGATIVE Final    Comment: (NOTE) The Xpert Xpress SARS-CoV-2/FLU/RSV plus assay is intended as an aid in the diagnosis of influenza from Nasopharyngeal swab specimens and should not be used as a sole basis for treatment. Nasal washings  and aspirates are unacceptable for Xpert Xpress SARS-CoV-2/FLU/RSV testing.  Fact Sheet for Patients: EntrepreneurPulse.com.au  Fact Sheet for Healthcare Providers: IncredibleEmployment.be  This test is not yet approved or cleared by the [place] FDA and has been authorized for detection and/or diagnosis of SARS-CoV-2 by FDA under an Emergency Use Authorization (EUA). This EUA will remain in effect (meaning this test can be used) for the duration of the COVID-19 declaration under Section 564(b)(1) of the Act, 21 U.S.C. section 360bbb-3(b)(1), unless the authorization is terminated or revoked.     Resp Syncytial Virus by PCR NEGATIVE NEGATIVE Final    Comment: (NOTE) Fact Sheet for Patients: EntrepreneurPulse.com.au  Fact Sheet for Healthcare Providers: IncredibleEmployment.be  This test is not yet approved or cleared by the [place] FDA and has been authorized for detection and/or diagnosis of SARS-CoV-2 by FDA under an Emergency Use Authorization (EUA). This EUA will remain in effect (meaning this test can be used) for the duration of the COVID-19 declaration under Section 564(b)(1) of the Act, 21 U.S.C. section 360bbb-3(b)(1), unless the authorization is terminated or revoked.  Performed at [facility], [address] [place], [place] [address]      Scheduled Meds:  digoxin  0.25 mg Intravenous Q6H   Followed by   Derrill Memo ON 11/28/2022] digoxin  0.125 mg Oral Daily   heparin  5,000 Units Subcutaneous Q8H   insulin aspart  0-5 Units Subcutaneous QHS   insulin aspart  0-9 Units Subcutaneous TID WC   ondansetron  4 mg Intravenous Once   oseltamivir  75 mg Oral BID   pantoprazole (PROTONIX) IV  40 mg Intravenous Q24H   Continuous Infusions:  ampicillin-sulbactam (UNASYN) IV 3 g (11/27/22 0935)   azithromycin Stopped (11/27/22 0943)    Procedures/Studies: ECHOCARDIOGRAM  COMPLETE  Result Date: 11/26/2022    ECHOCARDIOGRAM REPORT   Patient Name:   Henry [name] Date of Exam: 11/26/2022 Medical Rec #:  [id number]    Height:       71.0 in Accession #:    [id number]   Weight:       200.6 lb Date of Birth:  [birth year]    BSA:          2.111 m Patient Age:    33 years  BP:           103/64 mmHg Patient Gender: M            HR:           102 bpm. Exam Location:  [facility] Procedure: 2D Echo, Cardiac Doppler and Color Doppler Indications:    CHF-Acute Diastolic O29.47  History:        Patient has no prior history of Echocardiogram examinations.                 Risk Factors:Diabetes. Acute respiratory failure with hypoxia                 and Positive for FLU A at present, Mental disability (From Hx).  Sonographer:    Alvino Chapel RCS Referring Phys: [phone] Shavaughn Seidl  Sonographer Comments: Patient could NOT follow instructions to "sniff". IMPRESSIONS  1. Global hypokinesis worse in the inferior base . Left ventricular ejection fraction, by estimation, is 25 to 30%. The left ventricle has severely decreased function. The left ventricle demonstrates global hypokinesis. The left ventricular internal cavity size was moderately dilated. Left ventricular diastolic parameters are indeterminate.  2. Some septal flattening suggesting elevated PA pressure TR velocity inadequate to estimate . Right ventricular systolic function is mildly reduced. The right ventricular size is mildly enlarged.  3. Left atrial size was moderately dilated.  4. The mitral valve is normal in structure. Trivial mitral valve regurgitation. No evidence of mitral stenosis.  5. The aortic valve is tricuspid. Aortic valve regurgitation is not visualized. No aortic stenosis is present.  6. The inferior vena cava is dilated in size with >50% respiratory variability, suggesting right atrial pressure of 8 mmHg. FINDINGS  Left Ventricle: Global hypokinesis worse in the inferior base. Left ventricular ejection fraction, by  estimation, is 25 to 30%. The left ventricle has severely decreased function. The left ventricle demonstrates global hypokinesis. Definity contrast agent was given IV to delineate the left ventricular endocardial borders. The left ventricular internal cavity size was moderately dilated. There is no left ventricular hypertrophy. Left ventricular diastolic parameters are indeterminate. Right Ventricle: Some septal flattening suggesting elevated PA pressure TR velocity inadequate to estimate. The right ventricular size is mildly enlarged. No increase in right ventricular wall thickness. Right ventricular systolic function is mildly reduced. Left Atrium: Left atrial size was moderately dilated. Right Atrium: Right atrial size was normal in size. Pericardium: There is no evidence of pericardial effusion. Mitral Valve: The mitral valve is normal in structure. Trivial mitral valve regurgitation. No evidence of mitral valve stenosis. Tricuspid Valve: The tricuspid valve is normal in structure. Tricuspid valve regurgitation is mild . No evidence of tricuspid stenosis. Aortic Valve: The aortic valve is tricuspid. Aortic valve regurgitation is not visualized. No aortic stenosis is present. Pulmonic Valve: The pulmonic valve was normal in structure. Pulmonic valve regurgitation is trivial. No evidence of pulmonic stenosis. Aorta: The aortic root is normal in size and structure. Venous: The inferior vena cava is dilated in size with greater than 50% respiratory variability, suggesting right atrial pressure of 8 mmHg. IAS/Shunts: No atrial level shunt detected by color flow Doppler.  LEFT VENTRICLE PLAX 2D LVIDd:         5.60 cm      Diastology LVIDs:         4.60 cm      LV e' medial:    6.64 cm/s LV PW:         1.00 cm  LV E/e' medial:  12.2 LV IVS:        1.10 cm      LV e' lateral:   8.27 cm/s LVOT diam:     2.00 cm      LV E/e' lateral: 9.8 LV SV:         52 LV SV Index:   25 LVOT Area:     3.14 cm  LV Volumes (MOD) LV  vol d, MOD A2C: 116.0 ml LV vol d, MOD A4C: 126.0 ml LV vol s, MOD A2C: 92.4 ml LV vol s, MOD A4C: 81.5 ml LV SV MOD A2C:     23.6 ml LV SV MOD A4C:     126.0 ml LV SV MOD BP:      37.7 ml RIGHT VENTRICLE RV S prime:     12.65 cm/s TAPSE (M-mode): 1.5 cm LEFT ATRIUM             Index        RIGHT ATRIUM           Index LA diam:        4.40 cm 2.08 cm/m   RA Area:     24.70 cm LA Vol (A2C):   70.4 ml 33.34 ml/m  RA Volume:   82.00 ml  38.84 ml/m LA Vol (A4C):   65.2 ml 30.88 ml/m LA Biplane Vol: 74.5 ml 35.29 ml/m  AORTIC VALVE LVOT Vmax:   97.60 cm/s LVOT Vmean:  74.900 cm/s LVOT VTI:    0.166 m  AORTA Ao Root diam: 3.50 cm MITRAL VALVE               TRICUSPID VALVE MV Area (PHT): 4.94 cm    TR Peak grad:   40.7 mmHg MV Decel Time: 154 msec    TR Vmax:        319.00 cm/s MV E velocity: 80.70 cm/s MV A velocity: 53.30 cm/s  SHUNTS MV E/A ratio:  1.51        Systemic VTI:  0.17 m                            Systemic Diam: 2.00 cm Jenkins Rouge MD Electronically signed by Jenkins Rouge MD Signature Date/Time: 11/26/2022/4:51:55 PM    Final    DG Chest Port 1 View  Result Date: 11/25/2022 CLINICAL DATA:  Shortness of breath. EXAM: PORTABLE CHEST 1 VIEW COMPARISON:  Chest radiograph dated 04/15/2022. FINDINGS: Bilateral mid to lower lung field confluent nodular densities most consistent with developing infiltrate. Trace left pleural effusion suspected. No pneumothorax. Stable cardiomegaly. No acute osseous pathology. IMPRESSION: Bilateral mid to lower lung field developing infiltrate. Electronically Signed   By: Anner Crete M.D.   On: 11/25/2022 19:19    Henry Eva, DO  [facility]  If 7PM-7AM, please contact night-coverage [email or web address] Password [email or web address] 11/27/2022, 10:40 AM   LOS: 1 day

## 2022-11-28 ENCOUNTER — Inpatient Hospital Stay (HOSPITAL_COMMUNITY): Payer: Medicare Other

## 2022-11-28 DIAGNOSIS — J101 Influenza due to other identified influenza virus with other respiratory manifestations: Secondary | ICD-10-CM | POA: Diagnosis not present

## 2022-11-28 DIAGNOSIS — J9601 Acute respiratory failure with hypoxia: Secondary | ICD-10-CM | POA: Diagnosis not present

## 2022-11-28 DIAGNOSIS — R7881 Bacteremia: Secondary | ICD-10-CM | POA: Diagnosis not present

## 2022-11-28 DIAGNOSIS — B953 Streptococcus pneumoniae as the cause of diseases classified elsewhere: Secondary | ICD-10-CM

## 2022-11-28 DIAGNOSIS — E1165 Type 2 diabetes mellitus with hyperglycemia: Secondary | ICD-10-CM | POA: Diagnosis not present

## 2022-11-28 LAB — BASIC METABOLIC PANEL
Anion gap: 7 (ref 5–15)
BUN: 32 mg/dL — ABNORMAL HIGH (ref 8–23)
CO2: 25 mmol/L (ref 22–32)
Calcium: 7.4 mg/dL — ABNORMAL LOW (ref 8.9–10.3)
Chloride: 105 mmol/L (ref 98–111)
Creatinine, Ser: 0.7 mg/dL (ref 0.61–1.24)
GFR, Estimated: >60 mL/min (ref >60)
Glucose, Bld: 146 mg/dL — ABNORMAL HIGH (ref 70–99)
Potassium: 3.7 mmol/L (ref 3.5–5.1)
Sodium: 137 mmol/L (ref 135–145)

## 2022-11-28 LAB — CULTURE, BLOOD (ROUTINE X 2)
Special Requests: ADEQUATE
Special Requests: ADEQUATE

## 2022-11-28 LAB — GLUCOSE, CAPILLARY
Glucose-Capillary: 154 mg/dL — ABNORMAL HIGH (ref 70–99)
Glucose-Capillary: 151 mg/dL — ABNORMAL HIGH (ref 70–99)
Glucose-Capillary: 141 mg/dL — ABNORMAL HIGH (ref 70–99)
Glucose-Capillary: 141 mg/dL — ABNORMAL HIGH (ref 70–99)

## 2022-11-28 LAB — CBC
HCT: 36.2 % — ABNORMAL LOW (ref 39.0–52.0)
Hemoglobin: 11.8 g/dL — ABNORMAL LOW (ref 13.0–17.0)
MCH: 28.6 pg (ref 26.0–34.0)
MCHC: 32.6 g/dL (ref 30.0–36.0)
MCV: 87.7 fL (ref 80.0–100.0)
Platelets: 173 10*3/uL (ref 150–400)
RBC: 4.13 MIL/uL — ABNORMAL LOW (ref 4.22–5.81)
RDW: 15.1 % (ref 11.5–15.5)
WBC: 14.3 10*3/uL — ABNORMAL HIGH (ref 4.0–10.5)
nRBC: 0 % (ref 0.0–0.2)

## 2022-11-28 LAB — MAGNESIUM: Magnesium: 2 mg/dL (ref 1.7–2.4)

## 2022-11-28 LAB — PROCALCITONIN: Procalcitonin: 57.78 ng/mL

## 2022-11-28 LAB — BRAIN NATRIURETIC PEPTIDE: B Natriuretic Peptide: 809 pg/mL — ABNORMAL HIGH (ref 0.0–100.0)

## 2022-11-28 MED ORDER — FUROSEMIDE 10 MG/ML IJ SOLN
40.0000 mg | Freq: Once | INTRAMUSCULAR | Status: AC
  Administered 2022-11-28: 40 mg via INTRAVENOUS
  Filled 2022-11-28: qty 4

## 2022-11-28 MED ORDER — POTASSIUM CHLORIDE CRYS ER 20 MEQ PO TBCR
20.0000 meq | EXTENDED_RELEASE_TABLET | Freq: Once | ORAL | Status: AC
  Administered 2022-11-28: 20 meq via ORAL
  Filled 2022-11-28: qty 1

## 2022-11-28 NOTE — Progress Notes (Signed)
PROGRESS NOTE  IRAN Madej NLZ:[id number] DOB: 1950/12/28 DOA: 11/25/2022 PCP: Curlene Labrum, MD  Brief History:  71 year old male with a history of hearing impairment, esophageal dysphagia, GERD, diabetes mellitus type 2 presenting with fever, coughing, and vomiting for the last 2 to 3 days.  He has had increasing shortness of breath.  Apparently multiple family members have been sick. Reportedly patient lives with his sister. Caregiver is not here in the ER now.   Bedside RN states that family members took the patient's hearing aids home.  Patient has severe hearing loss.  Difficult to communicate.   In the ED, the patient was febrile up to 101.2 F with soft blood pressure down to 90/62.  Oxygen saturation was 85% room air. WBC 7.4, hemoglobin 12.9, platelets 171,000.  BMP showed sodium 130, potassium 3.9, bicarbonate 25, serum creatinine 0.79.  AST 71, ALT 32, alk phosphatase 84, total bilirubin 1.2.  Lactic acid peaked at 4.6.  The patient was started on oseltamavir.  Influenza PCR was positive. The patient became increasingly hypoxemic requiring high flow nasal cannula up to 15 L.   Assessment/Plan: Severe Sepsis -present on admission -fever, tachycardia, AMS, resp failure -due to bacteremia, pneumonia -PCT 9.33   Streptococcus pneumonia bacteremia -source = pneumonia -personally reviewed CXR--bilateral patchy opacities R>L -initially on Unasyn>>changed to ceftriaxone 2 g IV daily   Lobar Pneumonia -there is a component of aspiration pneumonitis -continue unasyn>>ceftriaxone   Acute respiratory failure with hypoxia -11/26/22 ABG 7.4 2/37/188/24 (15L) -now down to 4L -Secondary to influenza, pulm edema, pneumonia -Concerned about aspiration pneumonitis with a history of esophageal dysphagia and vomiting -Wean oxygen as tolerated -The patient does have signs of volume overload -BNP 2649 -Saline lock IV fluids   Acute HFrEF -12/28 Echo--EF 35-30% -continue  IV lasix judiciously due to soft BP -appreciate cardiology -low BP limiting GDMP presently>>initiate gradually as BP improves -obtain ReDS vest reading   Atrial tachycardia -personally reviewed EKG>>atrial tach -continue digoxin for now -if recurs, may need amiodarone -optimize electrolytes   Influenza A -continue oseltamavir -Check PCT 9.33 -Clear liquid diet initially given the patient's vomiting   Esophageal dysphagia -Given the patient's history of this and respiratory failure, concern about aspiration pneumonitis -seen by speech>>dys 3 with thin   Lactic acidosis -Primarily driven by the patient's hypoxia and sepsis   Diabetes mellitus type 2 with hyperglycemia -Check A1c--8.3 -NovoLog sliding scale -Holding metformin   GERD -Continue pantoprazole   Hypomagnesemia -repleted           Family Communication:   brother/sister updated 12/30 Consultants:  cardiology   Code Status:  DNR   DVT Prophylaxis:  [place] Heparin     Procedures: As Listed in Progress Note Above   Antibiotics: Unasyn 12/28>>12/29 Ceftriaxone 12/29>> Azithro 12/28>>12/29       Subjective: Pt has cough and some sob but improving.  Denies f/c, cp, abd pain.  N/v improving  Objective: Vitals:   11/28/22 0443 11/28/22 0500 11/28/22 0600 11/28/22 0800  BP:  (!) 112/48 119/60   Pulse:  89 (!) 59   Resp:  (!) 35 (!) 29   Temp: 98.9 F (37.2 C)   98.8 F (37.1 C)  TempSrc: Oral   Oral  SpO2:  95% 92%   Weight:  92 kg    Height:        Intake/Output Summary (Last 24 hours) at 11/28/2022 0825 Last data filed at 11/28/2022 0442 Gross per  24 hour  Intake 450.62 ml  Output 900 ml  Net -449.38 ml   Weight change:  Exam:  General:  Pt is alert, follows commands appropriately, not in acute distress HEENT: No icterus, No thrush, No neck mass, [place]/AT Cardiovascular: RRR, S1/S2, no rubs, no gallops; +PAC Respiratory: bibasilar rales. No wheeze Abdomen: Soft/+BS, non tender, non  distended, no guarding Extremities: trace LEedema, No lymphangitis, No petechiae, No rashes, no synovitis   Data Reviewed: I have personally reviewed following labs and imaging studies Basic Metabolic Panel: Recent Labs  Lab 11/25/22 1952 11/26/22 0450 11/27/22 0534 11/28/22 0416  NA 138 140 139 137  K 3.9 4.2 3.7 3.7  CL 101 107 106 105  CO2 _0 GLUCOSE 205* 166* 179* 146*  BUN 16 19 32* 32*  CREATININE 0.79 0.81 1.07 0.70  CALCIUM 8.6* 8.0* 7.3* 7.4*  MG  --  1.6* 1.9 2.0  PHOS  --   --  2.9  --    Liver Function Tests: Recent Labs  Lab 11/25/22 1952 11/26/22 0450 11/27/22 0534  AST 71* 79* 69*  ALT 32 34 29  ALKPHOS 84 72 54  BILITOT 1.2 0.8 1.0  PROT 7.3 6.1* 5.1*  ALBUMIN 3.5 3.0* 2.3*   No results for input(s): "LIPASE", "AMYLASE" in the last 168 hours. No results for input(s): "AMMONIA" in the last 168 hours. Coagulation Profile: Recent Labs  Lab 11/25/22 1952  INR 1.3*   CBC: Recent Labs  Lab 11/25/22 1952 11/26/22 0450 11/27/22 0534 11/28/22 0416  WBC 7.4 12.4* 13.5* 14.3*  NEUTROABS 6.2 9.4*  --   --   HGB 12.9* 12.5* 11.5* 11.8*  HCT 40.2 38.9* 35.5* 36.2*  MCV 88.4 89.4 87.7 87.7  PLT 171 165 170 173   Cardiac Enzymes: No results for input(s): "CKTOTAL", "CKMB", "CKMBINDEX", "TROPONINI" in the last 168 hours. BNP: Invalid input(s): "POCBNP" CBG: Recent Labs  Lab 11/27/22 0755 11/27/22 1151 11/27/22 1614 11/27/22 2134 11/28/22 0738  GLUCAP 161* 129* 113* 133* 154*   HbA1C: Recent Labs    11/25/22 1952  HGBA1C 8.3*   Urine analysis:    Component Value Date/Time   COLORURINE AMBER (A) 11/26/2022 0108   APPEARANCEUR HAZY (A) 11/26/2022 0108   LABSPEC 1.025 11/26/2022 0108   PHURINE 5.0 11/26/2022 0108   GLUCOSEU 50 (A) 11/26/2022 0108   HGBUR NEGATIVE 11/26/2022 0108   BILIRUBINUR NEGATIVE 11/26/2022 0108   KETONESUR 5 (A) 11/26/2022 0108   PROTEINUR >=300 (A) 11/26/2022 0108   NITRITE NEGATIVE 11/26/2022 0108    LEUKOCYTESUR NEGATIVE 11/26/2022 0108   Sepsis Labs: _1 (procalcitonin:4,lacticidven:4) ) Recent Results (from the past 240 hour(s))  Culture, blood (Routine x 2)     Status: Abnormal   Collection Time: 11/25/22  7:52 PM   Specimen: BLOOD  Result Value Ref Range Status   Specimen Description   Final    BLOOD LEFT ANTECUBITAL Performed at [facility], [address] [place], [place] [address]    Special Requests   Final    BOTTLES DRAWN AEROBIC AND ANAEROBIC Blood Culture adequate volume Performed at [facility], [address] [place],  [address]    Culture  Setup Time   Final    GRAM POSITIVE COCCI BOTTLES DRAWN AEROBIC AND ANAEROBIC Gram Stain Report Called to,Read Back By and Verified With: BASS,V _2  BY MATTHEWS, B 12.28.2023 CRITICAL RESULT CALLED TO, READ BACK BY AND VERIFIED WITHMaralyn Sago [facility] [phone] [id number] JRS Performed at [facility],  [address] [place], [place] [address]    Culture STREPTOCOCCUS PNEUMONIAE (A)  Final   Report Status 11/28/2022 FINAL  Final   Organism ID, Bacteria STREPTOCOCCUS PNEUMONIAE  Final      Susceptibility   Streptococcus pneumoniae - MIC*    ERYTHROMYCIN <=0.12 SENSITIVE Sensitive     LEVOFLOXACIN 0.5 SENSITIVE Sensitive     VANCOMYCIN <=0.12 SENSITIVE Sensitive     PENO - penicillin <=0.06      PENICILLIN (non-meningitis) <=0.06 SENSITIVE Sensitive     PENICILLIN (oral) <=0.06 SENSITIVE Sensitive     CEFTRIAXONE (meningitis) <=0.12 SENSITIVE Sensitive     * STREPTOCOCCUS PNEUMONIAE  Blood Culture ID Panel (Reflexed)     Status: Abnormal   Collection Time: 11/25/22  7:52 PM  Result Value Ref Range Status   Enterococcus faecalis NOT DETECTED NOT DETECTED Final   Enterococcus Faecium NOT DETECTED NOT DETECTED Final   Listeria monocytogenes NOT DETECTED NOT DETECTED Final   Staphylococcus species NOT DETECTED NOT DETECTED Final   Staphylococcus aureus (BCID) NOT DETECTED NOT DETECTED Final    Staphylococcus epidermidis NOT DETECTED NOT DETECTED Final   Staphylococcus lugdunensis NOT DETECTED NOT DETECTED Final   Streptococcus species DETECTED (A) NOT DETECTED Final    Comment: PHARMD STEVEN HURSH [id number] JRS   Streptococcus agalactiae NOT DETECTED NOT DETECTED Final   Streptococcus pneumoniae DETECTED (A) NOT DETECTED Final    Comment: PHARMD STEVEN [place] [id number] JRS   Streptococcus pyogenes NOT DETECTED NOT DETECTED Final   A.calcoaceticus-baumannii NOT DETECTED NOT DETECTED Final   Bacteroides fragilis NOT DETECTED NOT DETECTED Final   Enterobacterales NOT DETECTED NOT DETECTED Final   Enterobacter cloacae complex NOT DETECTED NOT DETECTED Final   Escherichia coli NOT DETECTED NOT DETECTED Final   Klebsiella aerogenes NOT DETECTED NOT DETECTED Final   Klebsiella oxytoca NOT DETECTED NOT DETECTED Final   Klebsiella pneumoniae NOT DETECTED NOT DETECTED Final   Proteus species NOT DETECTED NOT DETECTED Final   Salmonella species NOT DETECTED NOT DETECTED Final   Serratia marcescens NOT DETECTED NOT DETECTED Final   Haemophilus influenzae NOT DETECTED NOT DETECTED Final   Neisseria meningitidis NOT DETECTED NOT DETECTED Final   Pseudomonas aeruginosa NOT DETECTED NOT DETECTED Final   Stenotrophomonas maltophilia NOT DETECTED NOT DETECTED Final   Candida albicans NOT DETECTED NOT DETECTED Final   Candida auris NOT DETECTED NOT DETECTED Final   Candida glabrata NOT DETECTED NOT DETECTED Final   Candida krusei NOT DETECTED NOT DETECTED Final   Candida parapsilosis NOT DETECTED NOT DETECTED Final   Candida tropicalis NOT DETECTED NOT DETECTED Final   Cryptococcus neoformans/gattii NOT DETECTED NOT DETECTED Final    Comment: Performed at [facility], [address], [place], [place] [address]  Culture, blood (Routine x 2)     Status: Abnormal   Collection Time: 11/25/22  7:53 PM   Specimen: BLOOD  Result Value Ref Range Status   Specimen Description   Final     BLOOD RIGHT ANTECUBITAL Performed at [facility], [address], [place], [place] [address]    Special Requests   Final    BOTTLES DRAWN AEROBIC AND ANAEROBIC Blood Culture adequate volume Performed at [facility], [address] [place], [place] [address]    Culture  Setup Time   Final    GRAM POSITIVE COCCI BOTTLES DRAWN AEROBIC AND ANAEROBIC Gram Stain Report Called to,Read Back By and Verified With: BASS,V_0  BY MATTHEWS,B 12.28.2023 IN BOTH AEROBIC AND ANAEROBIC BOTTLES  Culture (A)  Final    STREPTOCOCCUS PNEUMONIAE SUSCEPTIBILITIES PERFORMED ON PREVIOUS CULTURE WITHIN THE LAST 5 DAYS. Performed at [facility], [place] [address], [place], [place] [address]    Report Status 11/28/2022 FINAL  Final  Resp panel by RT-PCR (RSV, Flu A&B, Covid) Anterior Nasal Swab     Status: Abnormal   Collection Time: 11/25/22  8:05 PM   Specimen: Anterior Nasal Swab  Result Value Ref Range Status   SARS Coronavirus 2 by RT PCR NEGATIVE NEGATIVE Final    Comment: (NOTE) SARS-CoV-2 target nucleic acids are NOT DETECTED.  The SARS-CoV-2 RNA is generally detectable in upper respiratory specimens during the acute phase of infection. The lowest concentration of SARS-CoV-2 viral copies this assay can detect is 138 copies/mL. A negative result does not preclude SARS-Cov-2 infection and should not be used as the sole basis for treatment or other patient management decisions. A negative result may occur with  improper specimen collection/handling, submission of specimen other than nasopharyngeal swab, presence of viral mutation(s) within the areas targeted by this assay, and inadequate number of viral copies(<138 copies/mL). A negative result must be combined with clinical observations, patient history, and epidemiological information. The expected result is Negative.  Fact Sheet for Patients:  EntrepreneurPulse.com.au  Fact Sheet for Healthcare Providers:   IncredibleEmployment.be  This test is no t yet approved or cleared by the [place] FDA and  has been authorized for detection and/or diagnosis of SARS-CoV-2 by FDA under an Emergency Use Authorization (EUA). This EUA will remain  in effect (meaning this test can be used) for the duration of the COVID-19 declaration under Section 564(b)(1) of the Act, 21 U.S.C.section 360bbb-3(b)(1), unless the authorization is terminated  or revoked sooner.       Influenza A by PCR POSITIVE (A) NEGATIVE Final   Influenza B by PCR NEGATIVE NEGATIVE Final    Comment: (NOTE) The Xpert Xpress SARS-CoV-2/FLU/RSV plus assay is intended as an aid in the diagnosis of influenza from Nasopharyngeal swab specimens and should not be used as a sole basis for treatment. Nasal washings and aspirates are unacceptable for Xpert Xpress SARS-CoV-2/FLU/RSV testing.  Fact Sheet for Patients: EntrepreneurPulse.com.au  Fact Sheet for Healthcare Providers: IncredibleEmployment.be  This test is not yet approved or cleared by the [place] FDA and has been authorized for detection and/or diagnosis of SARS-CoV-2 by FDA under an Emergency Use Authorization (EUA). This EUA will remain in effect (meaning this test can be used) for the duration of the COVID-19 declaration under Section 564(b)(1) of the Act, 21 U.S.C. section 360bbb-3(b)(1), unless the authorization is terminated or revoked.     Resp Syncytial Virus by PCR NEGATIVE NEGATIVE Final    Comment: (NOTE) Fact Sheet for Patients: EntrepreneurPulse.com.au  Fact Sheet for Healthcare Providers: IncredibleEmployment.be  This test is not yet approved or cleared by the [place] FDA and has been authorized for detection and/or diagnosis of SARS-CoV-2 by FDA under an Emergency Use Authorization (EUA). This EUA will remain in effect (meaning this test can be used)  for the duration of the COVID-19 declaration under Section 564(b)(1) of the Act, 21 U.S.C. section 360bbb-3(b)(1), unless the authorization is terminated or revoked.  Performed at [facility], [address], [place], [place] [address]   MRSA Next Gen by PCR, Nasal     Status: None   Collection Time: 11/27/22 11:56 AM   Specimen: Nasal Mucosa; Nasal Swab  Result Value Ref Range Status   MRSA by PCR Next  Gen NOT DETECTED NOT DETECTED Final    Comment: (NOTE) The GeneXpert MRSA Assay (FDA approved for NASAL specimens only), is one component of a comprehensive MRSA colonization surveillance program. It is not intended to diagnose MRSA infection nor to guide or monitor treatment for MRSA infections. Test performance is not FDA approved in patients less than 13 years old. Performed at [facility], [address] [place], [place] [address]      Scheduled Meds:  Chlorhexidine Gluconate Cloth  6 each Topical Daily   digoxin  0.125 mg Oral Daily   heparin  5,000 Units Subcutaneous Q8H   insulin aspart  0-5 Units Subcutaneous QHS   insulin aspart  0-9 Units Subcutaneous TID WC   ondansetron  4 mg Intravenous Once   oseltamivir  75 mg Oral BID   pantoprazole (PROTONIX) IV  40 mg Intravenous Q24H   Continuous Infusions:  cefTRIAXone (ROCEPHIN)  IV Stopped (11/27/22 2129)    Procedures/Studies: ECHOCARDIOGRAM COMPLETE  Result Date: 11/26/2022    ECHOCARDIOGRAM REPORT   Patient Name:   HARDIN HARDENBROOK Date of Exam: 11/26/2022 Medical Rec #:  [id number]    Height:       71.0 in Accession #:    [id number]   Weight:       200.6 lb Date of Birth:  1951-09-10    BSA:          2.111 m Patient Age:    25 years     BP:           103/64 mmHg Patient Gender: M            HR:           102 bpm. Exam Location:  [facility] Procedure: 2D Echo, Cardiac Doppler and Color Doppler Indications:    CHF-Acute Diastolic D22.02  History:        Patient has no prior history of Echocardiogram examinations.                  Risk Factors:Diabetes. Acute respiratory failure with hypoxia                 and Positive for FLU A at present, Mental disability (From Hx).  Sonographer:    Alvino Chapel RCS Referring Phys: [id number] Lillianah Swartzentruber  Sonographer Comments: Patient could NOT follow instructions to "sniff". IMPRESSIONS  1. Global hypokinesis worse in the inferior base . Left ventricular ejection fraction, by estimation, is 25 to 30%. The left ventricle has severely decreased function. The left ventricle demonstrates global hypokinesis. The left ventricular internal cavity size was moderately dilated. Left ventricular diastolic parameters are indeterminate.  2. Some septal flattening suggesting elevated PA pressure TR velocity inadequate to estimate . Right ventricular systolic function is mildly reduced. The right ventricular size is mildly enlarged.  3. Left atrial size was moderately dilated.  4. The mitral valve is normal in structure. Trivial mitral valve regurgitation. No evidence of mitral stenosis.  5. The aortic valve is tricuspid. Aortic valve regurgitation is not visualized. No aortic stenosis is present.  6. The inferior vena cava is dilated in size with >50% respiratory variability, suggesting right atrial pressure of 8 mmHg. FINDINGS  Left Ventricle: Global hypokinesis worse in the inferior base. Left ventricular ejection fraction, by estimation, is 25 to 30%. The left ventricle has severely decreased function. The left ventricle demonstrates global hypokinesis. Definity contrast agent was given IV to delineate the left ventricular endocardial borders. The left ventricular internal cavity  size was moderately dilated. There is no left ventricular hypertrophy. Left ventricular diastolic parameters are indeterminate. Right Ventricle: Some septal flattening suggesting elevated PA pressure TR velocity inadequate to estimate. The right ventricular size is mildly enlarged. No increase in right ventricular wall thickness. Right  ventricular systolic function is mildly reduced. Left Atrium: Left atrial size was moderately dilated. Right Atrium: Right atrial size was normal in size. Pericardium: There is no evidence of pericardial effusion. Mitral Valve: The mitral valve is normal in structure. Trivial mitral valve regurgitation. No evidence of mitral valve stenosis. Tricuspid Valve: The tricuspid valve is normal in structure. Tricuspid valve regurgitation is mild . No evidence of tricuspid stenosis. Aortic Valve: The aortic valve is tricuspid. Aortic valve regurgitation is not visualized. No aortic stenosis is present. Pulmonic Valve: The pulmonic valve was normal in structure. Pulmonic valve regurgitation is trivial. No evidence of pulmonic stenosis. Aorta: The aortic root is normal in size and structure. Venous: The inferior vena cava is dilated in size with greater than 50% respiratory variability, suggesting right atrial pressure of 8 mmHg. IAS/Shunts: No atrial level shunt detected by color flow Doppler.  LEFT VENTRICLE PLAX 2D LVIDd:         5.60 cm      Diastology LVIDs:         4.60 cm      LV e' medial:    6.64 cm/s LV PW:         1.00 cm      LV E/e' medial:  12.2 LV IVS:        1.10 cm      LV e' lateral:   8.27 cm/s LVOT diam:     2.00 cm      LV E/e' lateral: 9.8 LV SV:         52 LV SV Index:   25 LVOT Area:     3.14 cm  LV Volumes (MOD) LV vol d, MOD A2C: 116.0 ml LV vol d, MOD A4C: 126.0 ml LV vol s, MOD A2C: 92.4 ml LV vol s, MOD A4C: 81.5 ml LV SV MOD A2C:     23.6 ml LV SV MOD A4C:     126.0 ml LV SV MOD BP:      37.7 ml RIGHT VENTRICLE RV S prime:     12.65 cm/s TAPSE (M-mode): 1.5 cm LEFT ATRIUM             Index        RIGHT ATRIUM           Index LA diam:        4.40 cm 2.08 cm/m   RA Area:     24.70 cm LA Vol (A2C):   70.4 ml 33.34 ml/m  RA Volume:   82.00 ml  38.84 ml/m LA Vol (A4C):   65.2 ml 30.88 ml/m LA Biplane Vol: 74.5 ml 35.29 ml/m  AORTIC VALVE LVOT Vmax:   97.60 cm/s LVOT Vmean:  74.900 cm/s LVOT VTI:     0.166 m  AORTA Ao Root diam: 3.50 cm MITRAL VALVE               TRICUSPID VALVE MV Area (PHT): 4.94 cm    TR Peak grad:   40.7 mmHg MV Decel Time: 154 msec    TR Vmax:        319.00 cm/s MV E velocity: 80.70 cm/s MV A velocity: 53.30 cm/s  SHUNTS MV E/A ratio:  1.51  Systemic VTI:  0.17 m                            Systemic Diam: 2.00 cm Jenkins Rouge MD Electronically signed by Jenkins Rouge MD Signature Date/Time: 11/26/2022/4:51:55 PM    Final    DG Chest Port 1 View  Result Date: 11/25/2022 CLINICAL DATA:  Shortness of breath. EXAM: PORTABLE CHEST 1 VIEW COMPARISON:  Chest radiograph dated 04/15/2022. FINDINGS: Bilateral mid to lower lung field confluent nodular densities most consistent with developing infiltrate. Trace left pleural effusion suspected. No pneumothorax. Stable cardiomegaly. No acute osseous pathology. IMPRESSION: Bilateral mid to lower lung field developing infiltrate. Electronically Signed   By: Anner Crete M.D.   On: 11/25/2022 19:19    Orson Eva, DO  [facility]  If 7PM-7AM, please contact night-coverage [email or web address] Password [email or web address] 11/28/2022, 8:25 AM   LOS: 2 days

## 2022-11-28 NOTE — Plan of Care (Signed)
Much more  A&O today but still no appetite, did eat some ice cream and drink juice. Updated daughter, off Cardizem drip just PO medications for Afib. Still receiving IV antibiotics. Continue to monitor. Problem: Education: Goal: Ability to describe self-care measures that may prevent or decrease complications (Diabetes Survival Skills Education) will improve Outcome: Progressing Goal: Individualized Educational Video(s) Outcome: Progressing   Problem: Coping: Goal: Ability to adjust to condition or change in health will improve Outcome: Progressing   Problem: Fluid Volume: Goal: Ability to maintain a balanced intake and output will improve Outcome: Progressing   Problem: Health Behavior/Discharge Planning: Goal: Ability to identify and utilize available resources and services will improve Outcome: Progressing Goal: Ability to manage health-related needs will improve Outcome: Progressing   Problem: Metabolic: Goal: Ability to maintain appropriate glucose levels will improve Outcome: Progressing   Problem: Nutritional: Goal: Maintenance of adequate nutrition will improve Outcome: Progressing Goal: Progress toward achieving an optimal weight will improve Outcome: Progressing   Problem: Skin Integrity: Goal: Risk for impaired skin integrity will decrease Outcome: Progressing   Problem: Tissue Perfusion: Goal: Adequacy of tissue perfusion will improve Outcome: Progressing   Problem: Education: Goal: Knowledge of General Education information will improve Description: Including pain rating scale, medication(s)/side effects and non-pharmacologic comfort measures Outcome: Progressing   Problem: Health Behavior/Discharge Planning: Goal: Ability to manage health-related needs will improve Outcome: Progressing   Problem: Clinical Measurements: Goal: Ability to maintain clinical measurements within normal limits will improve Outcome: Progressing Goal: Will remain free from  infection Outcome: Progressing Goal: Diagnostic test results will improve Outcome: Progressing Goal: Respiratory complications will improve Outcome: Progressing Goal: Cardiovascular complication will be avoided Outcome: Progressing   Problem: Activity: Goal: Risk for activity intolerance will decrease Outcome: Progressing   Problem: Nutrition: Goal: Adequate nutrition will be maintained Outcome: Progressing   Problem: Coping: Goal: Level of anxiety will decrease Outcome: Progressing   Problem: Elimination: Goal: Will not experience complications related to bowel motility Outcome: Progressing Goal: Will not experience complications related to urinary retention Outcome: Progressing   Problem: Pain Managment: Goal: General experience of comfort will improve Outcome: Progressing   Problem: Safety: Goal: Ability to remain free from injury will improve Outcome: Progressing   Problem: Skin Integrity: Goal: Risk for impaired skin integrity will decrease Outcome: Progressing

## 2022-11-29 DIAGNOSIS — R1110 Vomiting, unspecified: Secondary | ICD-10-CM | POA: Insufficient documentation

## 2022-11-29 DIAGNOSIS — A403 Sepsis due to Streptococcus pneumoniae: Secondary | ICD-10-CM | POA: Diagnosis not present

## 2022-11-29 DIAGNOSIS — J101 Influenza due to other identified influenza virus with other respiratory manifestations: Secondary | ICD-10-CM | POA: Diagnosis not present

## 2022-11-29 DIAGNOSIS — J9601 Acute respiratory failure with hypoxia: Secondary | ICD-10-CM | POA: Diagnosis not present

## 2022-11-29 DIAGNOSIS — A419 Sepsis, unspecified organism: Secondary | ICD-10-CM | POA: Diagnosis not present

## 2022-11-29 LAB — BASIC METABOLIC PANEL
Anion gap: 9 (ref 5–15)
BUN: 24 mg/dL — ABNORMAL HIGH (ref 8–23)
CO2: 29 mmol/L (ref 22–32)
Calcium: 7.5 mg/dL — ABNORMAL LOW (ref 8.9–10.3)
Chloride: 101 mmol/L (ref 98–111)
Creatinine, Ser: 0.66 mg/dL (ref 0.61–1.24)
GFR, Estimated: >60 mL/min (ref >60)
Glucose, Bld: 144 mg/dL — ABNORMAL HIGH (ref 70–99)
Potassium: 3.4 mmol/L — ABNORMAL LOW (ref 3.5–5.1)
Sodium: 139 mmol/L (ref 135–145)

## 2022-11-29 LAB — CBC
HCT: 33.9 % — ABNORMAL LOW (ref 39.0–52.0)
Hemoglobin: 11.5 g/dL — ABNORMAL LOW (ref 13.0–17.0)
MCH: 29.3 pg (ref 26.0–34.0)
MCHC: 33.9 g/dL (ref 30.0–36.0)
MCV: 86.5 fL (ref 80.0–100.0)
Platelets: 159 10*3/uL (ref 150–400)
RBC: 3.92 MIL/uL — ABNORMAL LOW (ref 4.22–5.81)
RDW: 15.2 % (ref 11.5–15.5)
WBC: 11.4 10*3/uL — ABNORMAL HIGH (ref 4.0–10.5)
nRBC: 0 % (ref 0.0–0.2)

## 2022-11-29 LAB — MAGNESIUM: Magnesium: 1.9 mg/dL (ref 1.7–2.4)

## 2022-11-29 LAB — GLUCOSE, CAPILLARY
Glucose-Capillary: 146 mg/dL — ABNORMAL HIGH (ref 70–99)
Glucose-Capillary: 222 mg/dL — ABNORMAL HIGH (ref 70–99)
Glucose-Capillary: 229 mg/dL — ABNORMAL HIGH (ref 70–99)
Glucose-Capillary: 130 mg/dL — ABNORMAL HIGH (ref 70–99)

## 2022-11-29 LAB — PHOSPHORUS: Phosphorus: 3.2 mg/dL (ref 2.5–4.6)

## 2022-11-29 MED ORDER — POTASSIUM CHLORIDE 10 MEQ/100ML IV SOLN
10.0000 meq | INTRAVENOUS | Status: AC
  Administered 2022-11-29 (×3): 10 meq via INTRAVENOUS
  Filled 2022-11-29 (×3): qty 100

## 2022-11-29 MED ORDER — PROCHLORPERAZINE EDISYLATE 10 MG/2ML IJ SOLN
5.0000 mg | Freq: Four times a day (QID) | INTRAMUSCULAR | Status: DC
  Administered 2022-11-29 – 2022-12-05 (×23): 5 mg via INTRAVENOUS
  Filled 2022-11-29 (×23): qty 2

## 2022-11-29 MED ORDER — METOPROLOL TARTRATE 5 MG/5ML IV SOLN
2.5000 mg | Freq: Four times a day (QID) | INTRAVENOUS | Status: DC
  Administered 2022-11-29 – 2022-12-01 (×7): 2.5 mg via INTRAVENOUS
  Filled 2022-11-29 (×7): qty 5

## 2022-11-29 MED ORDER — MAGNESIUM SULFATE 2 GM/50ML IV SOLN
2.0000 g | Freq: Once | INTRAVENOUS | Status: AC
  Administered 2022-11-29: 2 g via INTRAVENOUS
  Filled 2022-11-29: qty 50

## 2022-11-29 MED ORDER — FUROSEMIDE 10 MG/ML IJ SOLN
40.0000 mg | Freq: Once | INTRAMUSCULAR | Status: AC
  Administered 2022-11-29: 40 mg via INTRAVENOUS
  Filled 2022-11-29: qty 4

## 2022-11-29 NOTE — Progress Notes (Signed)
PROGRESS NOTE  Henry [name] DJT:[id number] DOB: 05/23/1951 DOA: 11/25/2022 PCP: Henry Labrum, MD  Brief History:  71 year old male with a history of hearing impairment, esophageal dysphagia, GERD, diabetes mellitus type 2 presenting with fever, coughing, and vomiting for the last 2 to 3 days.  He has had increasing shortness of breath.  Apparently multiple family members have been sick. Reportedly patient lives with his sister. Caregiver is not here in the ER now.   Bedside RN states that family members took the patient's hearing aids home.  Patient has severe hearing loss.  Difficult to communicate.   In the ED, the patient was febrile up to 101.2 F with soft blood pressure down to 90/62.  Oxygen saturation was 85% room air. WBC 7.4, hemoglobin 12.9, platelets 171,000.  BMP showed sodium 130, potassium 3.9, bicarbonate 25, serum creatinine 0.79.  AST 71, ALT 32, alk phosphatase 84, total bilirubin 1.2.  Lactic acid peaked at 4.6.  The patient was started on oseltamavir.  Influenza PCR was positive. The patient became increasingly hypoxemic requiring high flow nasal cannula up to 15 L.  The patient has slowly improved with IV abx.  He was found to have acute HFrEF with EF 25-30% and started on IV lasix. His hospitalization has been prolonged by his slow improvement as well as his intractable vomiting and SVT/atrial tachycardia.   Assessment/Plan: Severe Sepsis -present on admission -fever, tachycardia, AMS, resp failure -due to bacteremia, pneumonia -PCT 9.33   Streptococcus pneumonia bacteremia -source = pneumonia -personally reviewed CXR--bilateral patchy opacities R>L -initially on Unasyn>>changed to ceftriaxone 2 g IV daily   Lobar Pneumonia -there is a component of aspiration pneumonitis -continue unasyn>>ceftriaxone   Acute respiratory failure with hypoxia -11/26/22 ABG 7.4 2/37/188/24 (15L) -now down to 4L>>3L -Secondary to influenza, pulm edema,  pneumonia -Concerned about aspiration pneumonitis with a history of esophageal dysphagia (Achalasia) and vomiting -Wean oxygen as tolerated -The patient does have signs of volume overload -BNP 2649 -Saline lock IV fluids   Acute HFrEF -12/28 Echo--EF 35-30% -redose IV lasix -appreciate cardiology -low BP limiting GDMP presently>>initiate gradually as BP improves -obtain ReDS vest reading--44   Atrial tachycardia -personally reviewed EKG>>atrial tach -continue digoxin for now -add lopressor IV now that hypotension is improving -if persists, may need amiodarone -optimize electrolytes   Influenza A -continue oseltamavir -Check PCT 9.33 -Clear liquid diet initially given the patient's vomiting   Esophageal dysphagia/Achalasia/Intractable vomiting -Given the patient's history of this and respiratory failure, concern about aspiration pneumonitis -seen by speech>>dys 3 with thin -start compazine around the clock -GI consult   Lactic acidosis -Primarily driven by the patient's hypoxia and sepsis   Diabetes mellitus type 2 with hyperglycemia -Check A1c--8.3 -NovoLog sliding scale -Holding metformin   GERD -Continue pantoprazole   Hypomagnesemia/Hypokalemia -repleted           Family Communication:   brother/sister updated 12/31 Consultants:  cardiology   Code Status:  DNR   DVT Prophylaxis:  [place] Heparin     Procedures: As Listed in Progress Note Above   Antibiotics: Unasyn 12/28>>12/29 Ceftriaxone 12/29>> Azithro 12/28>>12/29            Subjective: Patient having recurrent n/v.  No diarrhea.  Denies cp, sob, abd pain, headache  Objective: Vitals:   11/29/22 0500 11/29/22 0600 11/29/22 0700 11/29/22 1100  BP:  126/72 (!) 140/71   Pulse:  (!) 43 (!) 101   Resp:  (!) 29 (!)  25   Temp:   98.8 F (37.1 C) 98 F (36.7 C)  TempSrc:   Oral Oral  SpO2:  96% 100%   Weight: 92 kg     Height:        Intake/Output Summary (Last 24 hours) at  11/29/2022 1233 Last data filed at 11/29/2022 0645 Gross per 24 hour  Intake 580.06 ml  Output 1350 ml  Net -769.94 ml   Weight change: 1.2 kg Exam:  General:  Pt is alert, follows commands appropriately, not in acute distress HEENT: No icterus, No thrush, No neck mass, [place]/AT Cardiovascular: RRR, S1/S2, no rubs, no gallops Respiratory: bilateral rales. No wheeze Abdomen: Soft/+BS, non tender, non distended, no guarding Extremities: trace LE edema, No lymphangitis, No petechiae, No rashes, no synovitis   Data Reviewed: I have personally reviewed following labs and imaging studies Basic Metabolic Panel: Recent Labs  Lab 11/25/22 1952 11/26/22 0450 11/27/22 0534 11/28/22 0416 11/29/22 0400  NA 138 140 139 137 139  K 3.9 4.2 3.7 3.7 3.4*  CL 101 107 106 105 101  CO2 _0 GLUCOSE 205* 166* 179* 146* 144*  BUN 16 19 32* 32* 24*  CREATININE 0.79 0.81 1.07 0.70 0.66  CALCIUM 8.6* 8.0* 7.3* 7.4* 7.5*  MG  --  1.6* 1.9 2.0 1.9  PHOS  --   --  2.9  --  3.2   Liver Function Tests: Recent Labs  Lab 11/25/22 1952 11/26/22 0450 11/27/22 0534  AST 71* 79* 69*  ALT 32 34 29  ALKPHOS 84 72 54  BILITOT 1.2 0.8 1.0  PROT 7.3 6.1* 5.1*  ALBUMIN 3.5 3.0* 2.3*   No results for input(s): "LIPASE", "AMYLASE" in the last 168 hours. No results for input(s): "AMMONIA" in the last 168 hours. Coagulation Profile: Recent Labs  Lab 11/25/22 1952  INR 1.3*   CBC: Recent Labs  Lab 11/25/22 1952 11/26/22 0450 11/27/22 0534 11/28/22 0416 11/29/22 0400  WBC 7.4 12.4* 13.5* 14.3* 11.4*  NEUTROABS 6.2 9.4*  --   --   --   HGB 12.9* 12.5* 11.5* 11.8* 11.5*  HCT 40.2 38.9* 35.5* 36.2* 33.9*  MCV 88.4 89.4 87.7 87.7 86.5  PLT 171 165 170 173 159   Cardiac Enzymes: No results for input(s): "CKTOTAL", "CKMB", "CKMBINDEX", "TROPONINI" in the last 168 hours. BNP: Invalid input(s): "POCBNP" CBG: Recent Labs  Lab 11/28/22 1152 11/28/22 1622 11/28/22 2118 11/29/22 0743  11/29/22 1132  GLUCAP 151* 141* 141* 146* 222*   HbA1C: No results for input(s): "HGBA1C" in the last 72 hours. Urine analysis:    Component Value Date/Time   COLORURINE AMBER (A) 11/26/2022 0108   APPEARANCEUR HAZY (A) 11/26/2022 0108   LABSPEC 1.025 11/26/2022 0108   PHURINE 5.0 11/26/2022 0108   GLUCOSEU 50 (A) 11/26/2022 0108   HGBUR NEGATIVE 11/26/2022 0108   BILIRUBINUR NEGATIVE 11/26/2022 0108   KETONESUR 5 (A) 11/26/2022 0108   PROTEINUR >=300 (A) 11/26/2022 0108   NITRITE NEGATIVE 11/26/2022 0108   LEUKOCYTESUR NEGATIVE 11/26/2022 0108   Sepsis Labs: _1 (procalcitonin:4,lacticidven:4) ) Recent Results (from the past 240 hour(s))  Culture, blood (Routine x 2)     Status: Abnormal   Collection Time: 11/25/22  7:52 PM   Specimen: BLOOD  Result Value Ref Range Status   Specimen Description   Final    BLOOD LEFT ANTECUBITAL Performed at [facility], [address], [place], [place] [address]    Special Requests   Final  BOTTLES DRAWN AEROBIC AND ANAEROBIC Blood Culture adequate volume Performed at [facility], [address], [place], [place] [address]    Culture  Setup Time   Final    GRAM POSITIVE COCCI BOTTLES DRAWN AEROBIC AND ANAEROBIC Gram Stain Report Called to,Read Back By and Verified With: BASS,V _0  BY MATTHEWS, B 12.28.2023 CRITICAL RESULT CALLED TO, READ BACK BY AND VERIFIED WITH: Maralyn Sago [facility] [id number] JRS Performed at [facility], [place] [address], [place], [place] [address]    Culture STREPTOCOCCUS PNEUMONIAE (A)  Final   Report Status 11/28/2022 FINAL  Final   Organism ID, Bacteria STREPTOCOCCUS PNEUMONIAE  Final      Susceptibility   Streptococcus pneumoniae - MIC*    ERYTHROMYCIN <=0.12 SENSITIVE Sensitive     LEVOFLOXACIN 0.5 SENSITIVE Sensitive     VANCOMYCIN <=0.12 SENSITIVE Sensitive     PENO - penicillin <=0.06      PENICILLIN (non-meningitis) <=0.06 SENSITIVE Sensitive     PENICILLIN (oral) <=0.06 SENSITIVE  Sensitive     CEFTRIAXONE (meningitis) <=0.12 SENSITIVE Sensitive     * STREPTOCOCCUS PNEUMONIAE  Blood Culture ID Panel (Reflexed)     Status: Abnormal   Collection Time: 11/25/22  7:52 PM  Result Value Ref Range Status   Enterococcus faecalis NOT DETECTED NOT DETECTED Final   Enterococcus Faecium NOT DETECTED NOT DETECTED Final   Listeria monocytogenes NOT DETECTED NOT DETECTED Final   Staphylococcus species NOT DETECTED NOT DETECTED Final   Staphylococcus aureus (BCID) NOT DETECTED NOT DETECTED Final   Staphylococcus epidermidis NOT DETECTED NOT DETECTED Final   Staphylococcus lugdunensis NOT DETECTED NOT DETECTED Final   Streptococcus species DETECTED (A) NOT DETECTED Final    Comment: PHARMD STEVEN HURSH [id number] JRS   Streptococcus agalactiae NOT DETECTED NOT DETECTED Final   Streptococcus pneumoniae DETECTED (A) NOT DETECTED Final    Comment: PHARMD STEVEN [place] [id number] JRS   Streptococcus pyogenes NOT DETECTED NOT DETECTED Final   A.calcoaceticus-baumannii NOT DETECTED NOT DETECTED Final   Bacteroides fragilis NOT DETECTED NOT DETECTED Final   Enterobacterales NOT DETECTED NOT DETECTED Final   Enterobacter cloacae complex NOT DETECTED NOT DETECTED Final   Escherichia coli NOT DETECTED NOT DETECTED Final   Klebsiella aerogenes NOT DETECTED NOT DETECTED Final   Klebsiella oxytoca NOT DETECTED NOT DETECTED Final   Klebsiella pneumoniae NOT DETECTED NOT DETECTED Final   Proteus species NOT DETECTED NOT DETECTED Final   Salmonella species NOT DETECTED NOT DETECTED Final   Serratia marcescens NOT DETECTED NOT DETECTED Final   Haemophilus influenzae NOT DETECTED NOT DETECTED Final   Neisseria meningitidis NOT DETECTED NOT DETECTED Final   Pseudomonas aeruginosa NOT DETECTED NOT DETECTED Final   Stenotrophomonas maltophilia NOT DETECTED NOT DETECTED Final   Candida albicans NOT DETECTED NOT DETECTED Final   Candida auris NOT DETECTED NOT DETECTED Final   Candida glabrata  NOT DETECTED NOT DETECTED Final   Candida krusei NOT DETECTED NOT DETECTED Final   Candida parapsilosis NOT DETECTED NOT DETECTED Final   Candida tropicalis NOT DETECTED NOT DETECTED Final   Cryptococcus neoformans/gattii NOT DETECTED NOT DETECTED Final    Comment: Performed at [facility], [address] [place], [place] [address]  Culture, blood (Routine x 2)     Status: Abnormal   Collection Time: 11/25/22  7:53 PM   Specimen: BLOOD  Result Value Ref Range Status   Specimen Description   Final    BLOOD RIGHT ANTECUBITAL Performed at [facility],  [address], [place], [place] [address]    Special Requests   Final    BOTTLES DRAWN AEROBIC AND ANAEROBIC Blood Culture adequate volume Performed at [place] [place], [place] [address]    Culture  Setup Time   Final    GRAM POSITIVE COCCI BOTTLES DRAWN AEROBIC AND ANAEROBIC Gram Stain Report Called to,Read Back By and Verified With: BASS,V_0  BY MATTHEWS,B 12.28.2023 IN BOTH AEROBIC AND ANAEROBIC BOTTLES    Culture (A)  Final    STREPTOCOCCUS PNEUMONIAE SUSCEPTIBILITIES PERFORMED ON PREVIOUS CULTURE WITHIN THE LAST 5 DAYS. Performed at [facility], [place] [address] [place], [place] [address]    Report Status 11/28/2022 FINAL  Final  Resp panel by RT-PCR (RSV, Flu A&B, Covid) Anterior Nasal Swab     Status: Abnormal   Collection Time: 11/25/22  8:05 PM   Specimen: Anterior Nasal Swab  Result Value Ref Range Status   SARS Coronavirus 2 by RT PCR NEGATIVE NEGATIVE Final    Comment: (NOTE) SARS-CoV-2 target nucleic acids are NOT DETECTED.  The SARS-CoV-2 RNA is generally detectable in upper respiratory specimens during the acute phase of infection. The lowest concentration of SARS-CoV-2 viral copies this assay can detect is 138 copies/mL. A negative result does not preclude SARS-Cov-2 infection and should not be used as the sole basis for treatment or other patient management decisions. A  negative result may occur with  improper specimen collection/handling, submission of specimen other than nasopharyngeal swab, presence of viral mutation(s) within the areas targeted by this assay, and inadequate number of viral copies(<138 copies/mL). A negative result must be combined with clinical observations, patient history, and epidemiological information. The expected result is Negative.  Fact Sheet for Patients:  EntrepreneurPulse.com.au  Fact Sheet for Healthcare Providers:  IncredibleEmployment.be  This test is no t yet approved or cleared by the [place] FDA and  has been authorized for detection and/or diagnosis of SARS-CoV-2 by FDA under an Emergency Use Authorization (EUA). This EUA will remain  in effect (meaning this test can be used) for the duration of the COVID-19 declaration under Section 564(b)(1) of the Act, 21 U.S.C.section 360bbb-3(b)(1), unless the authorization is terminated  or revoked sooner.       Influenza A by PCR POSITIVE (A) NEGATIVE Final   Influenza B by PCR NEGATIVE NEGATIVE Final    Comment: (NOTE) The Xpert Xpress SARS-CoV-2/FLU/RSV plus assay is intended as an aid in the diagnosis of influenza from Nasopharyngeal swab specimens and should not be used as a sole basis for treatment. Nasal washings and aspirates are unacceptable for Xpert Xpress SARS-CoV-2/FLU/RSV testing.  Fact Sheet for Patients: EntrepreneurPulse.com.au  Fact Sheet for Healthcare Providers: IncredibleEmployment.be  This test is not yet approved or cleared by the [place] FDA and has been authorized for detection and/or diagnosis of SARS-CoV-2 by FDA under an Emergency Use Authorization (EUA). This EUA will remain in effect (meaning this test can be used) for the duration of the COVID-19 declaration under Section 564(b)(1) of the Act, 21 U.S.C. section 360bbb-3(b)(1), unless the  authorization is terminated or revoked.     Resp Syncytial Virus by PCR NEGATIVE NEGATIVE Final    Comment: (NOTE) Fact Sheet for Patients: EntrepreneurPulse.com.au  Fact Sheet for Healthcare Providers: IncredibleEmployment.be  This test is not yet approved or cleared by the [place] FDA and has been authorized for detection and/or diagnosis of SARS-CoV-2 by FDA under an Emergency Use Authorization (EUA). This EUA will remain in effect (meaning  this test can be used) for the duration of the COVID-19 declaration under Section 564(b)(1) of the Act, 21 U.S.C. section 360bbb-3(b)(1), unless the authorization is terminated or revoked.  Performed at [facility], [address], [place], [place] [address]   MRSA Next Gen by PCR, Nasal     Status: None   Collection Time: 11/27/22 11:56 AM   Specimen: Nasal Mucosa; Nasal Swab  Result Value Ref Range Status   MRSA by PCR Next Gen NOT DETECTED NOT DETECTED Final    Comment: (NOTE) The GeneXpert MRSA Assay (FDA approved for NASAL specimens only), is one component of a comprehensive MRSA colonization surveillance program. It is not intended to diagnose MRSA infection nor to guide or monitor treatment for MRSA infections. Test performance is not FDA approved in patients less than 84 years old. Performed at [facility], [address] [place], [place] [address]      Scheduled Meds:  Chlorhexidine Gluconate Cloth  6 each Topical Daily   digoxin  0.125 mg Oral Daily   furosemide  40 mg Intravenous Once   heparin  5,000 Units Subcutaneous Q8H   insulin aspart  0-5 Units Subcutaneous QHS   insulin aspart  0-9 Units Subcutaneous TID WC   metoprolol tartrate  2.5 mg Intravenous Q6H   ondansetron  4 mg Intravenous Once   oseltamivir  75 mg Oral BID   pantoprazole (PROTONIX) IV  40 mg Intravenous Q24H   Continuous Infusions:  cefTRIAXone (ROCEPHIN)  IV Stopped (11/28/22 2300)     Procedures/Studies: CT CHEST WO CONTRAST  Result Date: 11/28/2022 CLINICAL DATA:  Pneumonia, complication suspected.  Sepsis.  Fever. EXAM: CT CHEST WITHOUT CONTRAST TECHNIQUE: Multidetector CT imaging of the chest was performed following the standard protocol without IV contrast. RADIATION DOSE REDUCTION: This exam was performed according to the departmental dose-optimization program which includes automated exposure control, adjustment of the mA and/or kV according to patient size and/or use of iterative reconstruction technique. COMPARISON:  Chest radiograph November 25, 2022 and esophagram December 11, 2021 FINDINGS: Cardiovascular: Aortic atherosclerosis. Cardiac enlargement. Small pericardial effusion. Coronary artery calcifications. Mediastinum/Nodes: Prominent mediastinal lymph nodes and hilar nodal tissue not pathologically enlarged by size criteria and favored reactive. Fluid-filled patulous esophagus compatible with findings of achalasia seen on prior esophagram. Lungs/Pleura: Masslike consolidations in the bilateral lower lobes. Additional multifocal airspace opacities in the bilateral upper lobes and right middle lobe. Small bilateral pleural effusions. Upper Abdomen: No acute abnormality. Musculoskeletal: Remote left lateral rib fractures. Multilevel degenerative changes spine. Degenerative change of the bilateral glenohumeral joints. IMPRESSION: 1. Masslike consolidations in the bilateral lower lobes with additional multifocal airspace opacities in the bilateral upper lobes and right middle lobe, favored to reflect multifocal pneumonia. Recommend follow-up imaging to ensure resolution. 2. Small bilateral pleural effusions. 3. Fluid-filled patulous esophagus compatible with findings of achalasia seen on prior esophagram. 4. Cardiac enlargement with small pericardial effusion, consider further evaluation with echocardiography. 5.  Aortic Atherosclerosis (ICD10-I70.0). Electronically Signed    By: Dahlia Bailiff M.D.   On: 11/28/2022 12:11   ECHOCARDIOGRAM COMPLETE  Result Date: 11/26/2022    ECHOCARDIOGRAM REPORT   Patient Name:   Henry [name] Date of Exam: 11/26/2022 Medical Rec #:  [id number]    Height:       71.0 in Accession #:    [id number]   Weight:       200.6 lb Date of Birth:  04/18/1951    BSA:  2.111 m Patient Age:    68 years     BP:           103/64 mmHg Patient Gender: M            HR:           102 bpm. Exam Location:  [facility] Procedure: 2D Echo, Cardiac Doppler and Color Doppler Indications:    CHF-Acute Diastolic Y18.56  History:        Patient has no prior history of Echocardiogram examinations.                 Risk Factors:Diabetes. Acute respiratory failure with hypoxia                 and Positive for FLU A at present, Mental disability (From Hx).  Sonographer:    Alvino Chapel RCS Referring Phys: [phone] Denesia Donelan  Sonographer Comments: Patient could NOT follow instructions to "sniff". IMPRESSIONS  1. Global hypokinesis worse in the inferior base . Left ventricular ejection fraction, by estimation, is 25 to 30%. The left ventricle has severely decreased function. The left ventricle demonstrates global hypokinesis. The left ventricular internal cavity size was moderately dilated. Left ventricular diastolic parameters are indeterminate.  2. Some septal flattening suggesting elevated PA pressure TR velocity inadequate to estimate . Right ventricular systolic function is mildly reduced. The right ventricular size is mildly enlarged.  3. Left atrial size was moderately dilated.  4. The mitral valve is normal in structure. Trivial mitral valve regurgitation. No evidence of mitral stenosis.  5. The aortic valve is tricuspid. Aortic valve regurgitation is not visualized. No aortic stenosis is present.  6. The inferior vena cava is dilated in size with >50% respiratory variability, suggesting right atrial pressure of 8 mmHg. FINDINGS  Left Ventricle: Global hypokinesis worse in  the inferior base. Left ventricular ejection fraction, by estimation, is 25 to 30%. The left ventricle has severely decreased function. The left ventricle demonstrates global hypokinesis. Definity contrast agent was given IV to delineate the left ventricular endocardial borders. The left ventricular internal cavity size was moderately dilated. There is no left ventricular hypertrophy. Left ventricular diastolic parameters are indeterminate. Right Ventricle: Some septal flattening suggesting elevated PA pressure TR velocity inadequate to estimate. The right ventricular size is mildly enlarged. No increase in right ventricular wall thickness. Right ventricular systolic function is mildly reduced. Left Atrium: Left atrial size was moderately dilated. Right Atrium: Right atrial size was normal in size. Pericardium: There is no evidence of pericardial effusion. Mitral Valve: The mitral valve is normal in structure. Trivial mitral valve regurgitation. No evidence of mitral valve stenosis. Tricuspid Valve: The tricuspid valve is normal in structure. Tricuspid valve regurgitation is mild . No evidence of tricuspid stenosis. Aortic Valve: The aortic valve is tricuspid. Aortic valve regurgitation is not visualized. No aortic stenosis is present. Pulmonic Valve: The pulmonic valve was normal in structure. Pulmonic valve regurgitation is trivial. No evidence of pulmonic stenosis. Aorta: The aortic root is normal in size and structure. Venous: The inferior vena cava is dilated in size with greater than 50% respiratory variability, suggesting right atrial pressure of 8 mmHg. IAS/Shunts: No atrial level shunt detected by color flow Doppler.  LEFT VENTRICLE PLAX 2D LVIDd:         5.60 cm      Diastology LVIDs:         4.60 cm      LV e' medial:    6.64 cm/s LV  PW:         1.00 cm      LV E/e' medial:  12.2 LV IVS:        1.10 cm      LV e' lateral:   8.27 cm/s LVOT diam:     2.00 cm      LV E/e' lateral: 9.8 LV SV:         52 LV SV  Index:   25 LVOT Area:     3.14 cm  LV Volumes (MOD) LV vol d, MOD A2C: 116.0 ml LV vol d, MOD A4C: 126.0 ml LV vol s, MOD A2C: 92.4 ml LV vol s, MOD A4C: 81.5 ml LV SV MOD A2C:     23.6 ml LV SV MOD A4C:     126.0 ml LV SV MOD BP:      37.7 ml RIGHT VENTRICLE RV S prime:     12.65 cm/s TAPSE (M-mode): 1.5 cm LEFT ATRIUM             Index        RIGHT ATRIUM           Index LA diam:        4.40 cm 2.08 cm/m   RA Area:     24.70 cm LA Vol (A2C):   70.4 ml 33.34 ml/m  RA Volume:   82.00 ml  38.84 ml/m LA Vol (A4C):   65.2 ml 30.88 ml/m LA Biplane Vol: 74.5 ml 35.29 ml/m  AORTIC VALVE LVOT Vmax:   97.60 cm/s LVOT Vmean:  74.900 cm/s LVOT VTI:    0.166 m  AORTA Ao Root diam: 3.50 cm MITRAL VALVE               TRICUSPID VALVE MV Area (PHT): 4.94 cm    TR Peak grad:   40.7 mmHg MV Decel Time: 154 msec    TR Vmax:        319.00 cm/s MV E velocity: 80.70 cm/s MV A velocity: 53.30 cm/s  SHUNTS MV E/A ratio:  1.51        Systemic VTI:  0.17 m                            Systemic Diam: 2.00 cm Jenkins Rouge MD Electronically signed by Jenkins Rouge MD Signature Date/Time: 11/26/2022/4:51:55 PM    Final    DG Chest Port 1 View  Result Date: 11/25/2022 CLINICAL DATA:  Shortness of breath. EXAM: PORTABLE CHEST 1 VIEW COMPARISON:  Chest radiograph dated 04/15/2022. FINDINGS: Bilateral mid to lower lung field confluent nodular densities most consistent with developing infiltrate. Trace left pleural effusion suspected. No pneumothorax. Stable cardiomegaly. No acute osseous pathology. IMPRESSION: Bilateral mid to lower lung field developing infiltrate. Electronically Signed   By: Anner Crete M.D.   On: 11/25/2022 19:19    Orson Eva, DO  [facility]  If 7PM-7AM, please contact night-coverage [email or web address] Password [email or web address] 11/29/2022, 12:33 PM   LOS: 3 days

## 2022-11-29 NOTE — Consult Note (Signed)
Referring Provider: No ref. provider found Primary Care Physician:  Curlene Labrum, MD Primary Gastroenterologist:  Dr.Carver  Reason for Consultation: Dysphagia/recurrent nausea and vomiting.  HPI: 71 year old gentleman with chronic esophageal dysphagia GERD, hearing impairment admitted to the hospital 12/27 with fever, cough and hypoxemia.  Flu  PCR positive.  Infiltrates on chest x-ray. Streptococcal pneumonia bacteremia.  Other comorbidities include HFrEF/atrial tachycardia. GI history significant for esophageal dysphagia going back 10 years or more according to his sisters ([place]ammy and [place]) who accompany the patient at the bedside. Seen by Dr. Abbey Chatters earlier this year.  EGD demonstrated dilated esophagus and elevated tone at the LES suspicious for achalasia -no evidence for pseudo achalasia..  Balloon dilation was performed.  Patient essentially had no improvement in dysphagia.  Manometry was ordered.  Went down to [facility].  Patient did not tolerate placement of the manometry catheter.  Procedure was aborted. Patient has seen speech pathology for swallowing therapy.  He does have dentures. Recurrent bouts of pneumonia. No melena or rectal bleeding.  Past Medical History:  Diagnosis Date   Diabetes ([place])    GERD (gastroesophageal reflux disease)    Mental disability     Past Surgical History:  Procedure Laterality Date   ANKLE FRACTURE SURGERY     pin   BALLOON DILATION N/A 12/30/2021   Procedure: BALLOON DILATION;  Surgeon: Eloise Harman, DO;  Location: [facility];  Service: Endoscopy;  Laterality: N/A;   cervical fracture     wore halo brace   COLONOSCOPY  05/18/2011   Cleopha Indelicato; Left sided diverticula, otherwise normal exam.  Repeat in 10 years.   COLONOSCOPY WITH PROPOFOL N/A 12/30/2021   Procedure: COLONOSCOPY WITH PROPOFOL;  Surgeon: Eloise Harman, DO;  Location: [facility];  Service: Endoscopy;  Laterality: N/A;  10:45am   ESOPHAGOGASTRODUODENOSCOPY   10/30/2005   distal esophageal narrowing s/p dilation up to 78m. ?achalasia?   ESOPHAGOGASTRODUODENOSCOPY  05/18/2011   Chanell Nadeau; Submucosal ring at GE junction s/p dilation, hiatal hernia, normal examined stomach, questionable scalloping at D2 and D3 s/p biopsy.  Pathology with chronic duodenitis consistent with peptic duodenitis.   ESOPHAGOGASTRODUODENOSCOPY  10/2019   Dr. DStaci Righter moderate dilation of upper third of the esophagus and middle third of the esophagus s/p biopsied, otherwise normal exam.  Recommended considering manometry to rule out achalasia versus BPE. No dilation performed.   ESOPHAGOGASTRODUODENOSCOPY  11/28/2019   Dr. BJill Alexanders Moderate dilation of upper and middle third of the esophagus s/p biopsied, no dilatable stricture seen, normal examined stomach and duodenum. Pathology was benign.   ESOPHAGOGASTRODUODENOSCOPY (EGD) WITH PROPOFOL N/A 12/30/2021   Procedure: ESOPHAGOGASTRODUODENOSCOPY (EGD) WITH PROPOFOL;  Surgeon: CEloise Harman DO;  Location: [facility];  Service: Endoscopy;  Laterality: N/A;   POLYPECTOMY  12/30/2021   Procedure: POLYPECTOMY;  Surgeon: CEloise Harman DO;  Location: [facility];  Service: Endoscopy;;   TONSILLECTOMY      Prior to Admission medications   Medication Sig Start Date End Date Taking? Authorizing Provider  acetaminophen (TYLENOL) 500 MG tablet Take 1,000 mg by mouth every 6 (six) hours as needed for fever.   Yes [provider]  cetirizine (ZYRTEC) 10 MG tablet Take 10 mg by mouth daily.   Yes [provider]  Chlorpheniramine-Acetaminophen (CORICIDIN HBP COLD/FLU PO) Take 1 tablet by mouth as needed (cough).   Yes [provider]  diphenhydramine-acetaminophen (TYLENOL PM) 25-500 MG TABS Take 1 tablet by mouth at bedtime as needed (pain/sleep).  Yes [provider]  metFORMIN (GLUCOPHAGE) 500 MG tablet Take 500 mg by mouth 2 (two) times daily. 11/01/21  Yes [provider]   Multiple Vitamins-Minerals (MULTIVITAMIN WITH MINERALS) tablet Take 1 tablet by mouth daily.   Yes [provider]  omeprazole (PRILOSEC) 20 MG capsule Take 20 mg by mouth daily.     Yes [provider]  sertraline (ZOLOFT) 25 MG tablet Take 25 mg by mouth daily. 10/13/22  Yes [provider]  albuterol (VENTOLIN HFA) 108 (90 Base) MCG/ACT inhaler Inhale 2 puffs into the lungs every 6 (six) hours as needed for wheezing or shortness of breath. Patient not taking: Reported on 11/25/2022 04/18/22   Heath Lark D, DO    Current Facility-Administered Medications  Medication Dose Route Frequency Provider Last Rate Last Admin   acetaminophen (TYLENOL) tablet 650 mg  650 mg Oral Q6H PRN Kristopher Oppenheim, DO   650 mg at 11/28/22 2105   Or   acetaminophen (TYLENOL) suppository 650 mg  650 mg Rectal Q6H PRN Kristopher Oppenheim, DO   650 mg at 11/26/22 1440   cefTRIAXone (ROCEPHIN) 2 g in sodium chloride 0.9 % 100 mL IVPB  2 g Intravenous Q24H Orson Eva, MD   Stopped at 11/28/22 2300   Chlorhexidine Gluconate Cloth 2 % PADS 6 each  6 each Topical Daily Tat, Shanon Brow, MD   6 each at 11/29/22 8250   digoxin (LANOXIN) tablet 0.125 mg  0.125 mg Oral Daily Kristopher Oppenheim, DO   0.125 mg at 11/29/22 5397   furosemide (LASIX) injection 40 mg  40 mg Intravenous Once Tat, Shanon Brow, MD       heparin injection 5,000 Units  5,000 Units Subcutaneous Q8H Kristopher Oppenheim, DO   5,000 Units at 11/29/22 6734   insulin aspart (novoLOG) injection 0-5 Units  0-5 Units Subcutaneous QHS Kristopher Oppenheim, DO       insulin aspart (novoLOG) injection 0-9 Units  0-9 Units Subcutaneous TID WC Kristopher Oppenheim, DO   3 Units at 11/29/22 1211   ipratropium-albuterol (DUONEB) 0.5-2.5 (3) MG/3ML nebulizer solution 3 mL  3 mL Nebulization Q6H PRN Kristopher Oppenheim, DO       magnesium sulfate IVPB 2 g 50 mL  2 g Intravenous Once Tat, Shanon Brow, MD       melatonin tablet 9 mg  9 mg Oral QHS PRN Kristopher Oppenheim, DO   9 mg at 11/28/22 2105   metoprolol tartrate  (LOPRESSOR) injection 2.5 mg  2.5 mg Intravenous Q6H Orson Eva, MD       ondansetron [facility]) injection 4 mg  4 mg Intravenous Once Kristopher Oppenheim, DO       ondansetron [facility]) tablet 4 mg  4 mg Oral Q6H PRN Kristopher Oppenheim, DO       Or   ondansetron [facility]) injection 4 mg  4 mg Intravenous Q6H PRN Kristopher Oppenheim, DO   4 mg at 11/27/22 0935   oseltamivir (TAMIFLU) capsule 75 mg  75 mg Oral BID Tat, Shanon Brow, MD   75 mg at 11/29/22 0842   pantoprazole (PROTONIX) injection 40 mg  40 mg Intravenous Q24H Kristopher Oppenheim, DO   40 mg at 11/29/22 1937   potassium chloride 10 mEq in 100 mL IVPB  10 mEq Intravenous Q1 Hr x 3 Tat, David, MD       prochlorperazine (COMPAZINE) injection 5 mg  5 mg Intravenous Nicholes Calamity, MD        Allergies as of 11/25/2022   (No Known Allergies)  Family History  Problem Relation Age of Onset   Colon cancer Neg Hx     Social History   Socioeconomic History   Marital status: Single    Spouse name: Not on file   Number of children: 0   Years of education: Not on file   Highest education level: Not on file  Occupational History   Occupation: unemployed  Tobacco Use   Smoking status: Never   Smokeless tobacco: Former    Types: Snuff   Tobacco comments:    uses dip  Vaping Use   Vaping Use: Never used  Substance and Sexual Activity   Alcohol use: Not Currently   Drug use: No   Sexual activity: Not on file  Other Topics Concern   Not on file  Social History Narrative   Lives with sister.   Social Determinants of Health   Financial Resource Strain: Not on file  Food Insecurity: No Food Insecurity (11/27/2022)   Hunger Vital Sign    Worried About Running Out of Food in the Last Year: Never true    Ran Out of Food in the Last Year: Never true  Transportation Needs: No Transportation Needs (11/27/2022)   PRAPARE - Hydrologist (Medical): No    Lack of Transportation (Non-Medical): No  Physical Activity: Not on file  Stress: Not on  file  Social Connections: Not on file  Intimate Partner Violence: Not At Risk (11/27/2022)   Humiliation, Afraid, Rape, and Kick questionnaire    Fear of Current or Ex-Partner: No    Emotionally Abused: No    Physically Abused: No    Sexually Abused: No    Review of Systems:  As in history of present illness.  Physical Exam: Vital signs in last 24 hours: Temp:  [98 F (36.7 C)-98.8 F (37.1 C)] 98 F (36.7 C) (12/31 1100) Pulse Rate:  [32-101] 101 (12/31 0700) Resp:  [23-45] 25 (12/31 0700) BP: (93-146)/(46-90) 140/71 (12/31 0700) SpO2:  [88 %-100 %] 100 % (12/31 0700) Weight:  [92 kg] 92 kg (12/31 0500) Last BM Date : 11/24/22 General:   Alert,  pleasant and cooperative in NAD; apparently significantly hard of hearing.  Sisters in attendance. Neck:  Supple; no masses or thyromegaly. Abdomen:  Soft, nontender and nondistended. No masses, hepatosplenomegaly or hernias noted. Normal bowel sounds, without guarding, and without rebound.    Intake/Output from previous day: 12/30 0701 - 12/31 0700 In: 940.1 [P.O.:840; IV Piggyback:100.1] Out: 1430 [Urine:1250; Emesis/NG output:180] Intake/Output this shift: No intake/output data recorded.  Lab Results: Recent Labs    11/27/22 0534 11/28/22 0416 11/29/22 0400  WBC 13.5* 14.3* 11.4*  HGB 11.5* 11.8* 11.5*  HCT 35.5* 36.2* 33.9*  PLT 170 173 159   BMET Recent Labs    11/27/22 0534 11/28/22 0416 11/29/22 0400  NA 139 137 139  K 3.7 3.7 3.4*  CL 106 105 101  CO2 '25 25 29  '$ GLUCOSE 179* 146* 144*  BUN 32* 32* 24*  CREATININE 1.07 0.70 0.66  CALCIUM 7.3* 7.4* 7.5*   LFT Recent Labs    11/27/22 0534  PROT 5.1*  ALBUMIN 2.3*  AST 69*  ALT 29  ALKPHOS 54  BILITOT 1.0   PT/INR No results for input(s): "LABPROT", "INR" in the last 72 hours. Hepatitis Panel No results for input(s): "HEPBSAG", "HCVAB", "HEPAIGM", "HEPBIGM" in the last 72 hours. C-Diff No results for input(s): "CDIFFTOX" in the last 72  hours.  Studies/Results: CT CHEST WO  CONTRAST  Result Date: 11/28/2022 CLINICAL DATA:  Pneumonia, complication suspected.  Sepsis.  Fever. EXAM: CT CHEST WITHOUT CONTRAST TECHNIQUE: Multidetector CT imaging of the chest was performed following the standard protocol without IV contrast. RADIATION DOSE REDUCTION: This exam was performed according to the departmental dose-optimization program which includes automated exposure control, adjustment of the mA and/or kV according to patient size and/or use of iterative reconstruction technique. COMPARISON:  Chest radiograph November 25, 2022 and esophagram December 11, 2021 FINDINGS: Cardiovascular: Aortic atherosclerosis. Cardiac enlargement. Small pericardial effusion. Coronary artery calcifications. Mediastinum/Nodes: Prominent mediastinal lymph nodes and hilar nodal tissue not pathologically enlarged by size criteria and favored reactive. Fluid-filled patulous esophagus compatible with findings of achalasia seen on prior esophagram. Lungs/Pleura: Masslike consolidations in the bilateral lower lobes. Additional multifocal airspace opacities in the bilateral upper lobes and right middle lobe. Small bilateral pleural effusions. Upper Abdomen: No acute abnormality. Musculoskeletal: Remote left lateral rib fractures. Multilevel degenerative changes spine. Degenerative change of the bilateral glenohumeral joints. IMPRESSION: 1. Masslike consolidations in the bilateral lower lobes with additional multifocal airspace opacities in the bilateral upper lobes and right middle lobe, favored to reflect multifocal pneumonia. Recommend follow-up imaging to ensure resolution. 2. Small bilateral pleural effusions. 3. Fluid-filled patulous esophagus compatible with findings of achalasia seen on prior esophagram. 4. Cardiac enlargement with small pericardial effusion, consider further evaluation with echocardiography. 5.  Aortic Atherosclerosis (ICD10-I70.0). Electronically Signed    By: Dahlia Bailiff M.D.   On: 11/28/2022 12:11      Impression: 71 year old gentleman with a longstanding esophageal dysphagia admitted to the hospital with recurrent pneumonia(influenza PCR positive + streptococcal pneumonia bacteremia) along with exacerbation of esophageal dysphagia to liquids and solids with associated regurgitation.  I suspect he does have achalasia although it has not been manometrically proven.  Endoscopic findings and clinical course highly suggestive of this entity.  Nothing on recent EGD to suggest pseudo-achalasia.  He is likely experiencing recurrent aspiration from back up in his esophagus.  Having false teeth exacerbates the situation as he likely swallows larger boluses of solid food on a regular basis.  Unfortunately, he has not a candidate for traditional definitive therapy (surgical myotomy, endoscopic myotomy, pneumatic bag dilation).  Smooth muscle dilators including nitroglycerin and calcium channel blockers do not work and are not recommended.  Periodic endoscopic injection of Botox into the lower esophageal sphincter would likely be helpful in this clinical setting.  It is only temporary, however, it often provides patients with significant improvement in swallowing for months after treatment.   Recommendations:  Barium pill esophagram to rule out an occult food impaction and reassess LES narrowing, etc.  Continue PPI once daily empirically (for any GERD /stress ulcer prophylaxis)  Swallowing precautions reviewed with family and patient  Would consider pursuing Botox injection once he is clinically improved-ideally, prior to discharge.  Further recommendations to follow.     Notice:  This dictation was prepared with Dragon dictation along with smaller phrase technology. Any transcriptional errors that result from this process are unintentional and may not be corrected upon review.

## 2022-11-29 NOTE — H&P (View-Only) (Signed)
Referring Provider: No ref. provider found Primary Care Physician:  Curlene Labrum, MD Primary Gastroenterologist:  Dr.Carver  Reason for Consultation: Dysphagia/recurrent nausea and vomiting.  HPI: 71 year old gentleman with chronic esophageal dysphagia GERD, hearing impairment admitted to the hospital 12/27 with fever, cough and hypoxemia.  Flu  PCR positive.  Infiltrates on chest x-ray. Streptococcal pneumonia bacteremia.  Other comorbidities include HFrEF/atrial tachycardia. GI history significant for esophageal dysphagia going back 10 years or more according to his sisters ([place]ammy and [place]) who accompany the patient at the bedside. Seen by Dr. Abbey Chatters earlier this year.  EGD demonstrated dilated esophagus and elevated tone at the LES suspicious for achalasia -no evidence for pseudo achalasia..  Balloon dilation was performed.  Patient essentially had no improvement in dysphagia.  Manometry was ordered.  Went down to [facility].  Patient did not tolerate placement of the manometry catheter.  Procedure was aborted. Patient has seen speech pathology for swallowing therapy.  He does have dentures. Recurrent bouts of pneumonia. No melena or rectal bleeding.  Past Medical History:  Diagnosis Date   Diabetes ([place])    GERD (gastroesophageal reflux disease)    Mental disability     Past Surgical History:  Procedure Laterality Date   ANKLE FRACTURE SURGERY     pin   BALLOON DILATION N/A 12/30/2021   Procedure: BALLOON DILATION;  Surgeon: Eloise Harman, DO;  [facility];  Service: Endoscopy;  Laterality: N/A;   cervical fracture     wore halo brace   COLONOSCOPY  05/18/2011   Jaze Rodino; Left sided diverticula, otherwise normal exam.  Repeat in 10 years.   COLONOSCOPY WITH PROPOFOL N/A 12/30/2021   Procedure: COLONOSCOPY WITH PROPOFOL;  Surgeon: Eloise Harman, DO;  [facility]: [facility];  Service: Endoscopy;  Laterality: N/A;  10:45am   ESOPHAGOGASTRODUODENOSCOPY   10/30/2005   distal esophageal narrowing s/p dilation up to 61m. ?achalasia?   ESOPHAGOGASTRODUODENOSCOPY  05/18/2011   Shandora Koogler; Submucosal ring at GE junction s/p dilation, hiatal hernia, normal examined stomach, questionable scalloping at D2 and D3 s/p biopsy.  Pathology with chronic duodenitis consistent with peptic duodenitis.   ESOPHAGOGASTRODUODENOSCOPY  10/2019   Dr. DStaci Righter moderate dilation of upper third of the esophagus and middle third of the esophagus s/p biopsied, otherwise normal exam.  Recommended considering manometry to rule out achalasia versus BPE. No dilation performed.   ESOPHAGOGASTRODUODENOSCOPY  11/28/2019   Dr. BJill Alexanders Moderate dilation of upper and middle third of the esophagus s/p biopsied, no dilatable stricture seen, normal examined stomach and duodenum. Pathology was benign.   ESOPHAGOGASTRODUODENOSCOPY (EGD) WITH PROPOFOL N/A 12/30/2021   Procedure: ESOPHAGOGASTRODUODENOSCOPY (EGD) WITH PROPOFOL;  Surgeon: CEloise Harman DO;  [facility];  Service: Endoscopy;  Laterality: N/A;   POLYPECTOMY  12/30/2021   Procedure: POLYPECTOMY;  Surgeon: CEloise Harman DO;  [facility];  Service: Endoscopy;;   TONSILLECTOMY      Prior to Admission medications   Medication Sig Start Date End Date Taking? Authorizing Provider  acetaminophen (TYLENOL) 500 MG tablet Take 1,000 mg by mouth every 6 (six) hours as needed for fever.   Yes [provider]  cetirizine (ZYRTEC) 10 MG tablet Take 10 mg by mouth daily.   Yes [provider]  Chlorpheniramine-Acetaminophen (CORICIDIN HBP COLD/FLU PO) Take 1 tablet by mouth as needed (cough).   Yes [provider]  diphenhydramine-acetaminophen (TYLENOL PM) 25-500 MG TABS Take 1 tablet by mouth at bedtime as needed (pain/sleep).  Yes [provider]  metFORMIN (GLUCOPHAGE) 500 MG tablet Take 500 mg by mouth 2 (two) times daily. 11/01/21  Yes [provider]   Multiple Vitamins-Minerals (MULTIVITAMIN WITH MINERALS) tablet Take 1 tablet by mouth daily.   Yes [provider]  omeprazole (PRILOSEC) 20 MG capsule Take 20 mg by mouth daily.     Yes [provider]  sertraline (ZOLOFT) 25 MG tablet Take 25 mg by mouth daily. 10/13/22  Yes [provider]  albuterol (VENTOLIN HFA) 108 (90 Base) MCG/ACT inhaler Inhale 2 puffs into the lungs every 6 (six) hours as needed for wheezing or shortness of breath. Patient not taking: Reported on 11/25/2022 04/18/22   Heath Lark D, DO    Current Facility-Administered Medications  Medication Dose Route Frequency Provider Last Rate Last Admin   acetaminophen (TYLENOL) tablet 650 mg  650 mg Oral Q6H PRN Kristopher Oppenheim, DO   650 mg at 11/28/22 2105   Or   acetaminophen (TYLENOL) suppository 650 mg  650 mg Rectal Q6H PRN Kristopher Oppenheim, DO   650 mg at 11/26/22 1440   cefTRIAXone (ROCEPHIN) 2 g in sodium chloride 0.9 % 100 mL IVPB  2 g Intravenous Q24H Orson Eva, MD   Stopped at 11/28/22 2300   Chlorhexidine Gluconate Cloth 2 % PADS 6 each  6 each Topical Daily Tat, Shanon Brow, MD   6 each at 11/29/22 7253   digoxin (LANOXIN) tablet 0.125 mg  0.125 mg Oral Daily Kristopher Oppenheim, DO   0.125 mg at 11/29/22 6644   furosemide (LASIX) injection 40 mg  40 mg Intravenous Once Tat, Shanon Brow, MD       heparin injection 5,000 Units  5,000 Units Subcutaneous Q8H Kristopher Oppenheim, DO   5,000 Units at 11/29/22 0347   insulin aspart (novoLOG) injection 0-5 Units  0-5 Units Subcutaneous QHS Kristopher Oppenheim, DO       insulin aspart (novoLOG) injection 0-9 Units  0-9 Units Subcutaneous TID WC Kristopher Oppenheim, DO   3 Units at 11/29/22 1211   ipratropium-albuterol (DUONEB) 0.5-2.5 (3) MG/3ML nebulizer solution 3 mL  3 mL Nebulization Q6H PRN Kristopher Oppenheim, DO       magnesium sulfate IVPB 2 g 50 mL  2 g Intravenous Once Tat, Shanon Brow, MD       melatonin tablet 9 mg  9 mg Oral QHS PRN Kristopher Oppenheim, DO   9 mg at 11/28/22 2105   metoprolol tartrate  (LOPRESSOR) injection 2.5 mg  2.5 mg Intravenous Q6H Orson Eva, MD       ondansetron [facility]) injection 4 mg  4 mg Intravenous Once Kristopher Oppenheim, DO       ondansetron [facility]) tablet 4 mg  4 mg Oral Q6H PRN Kristopher Oppenheim, DO       Or   ondansetron [facility]) injection 4 mg  4 mg Intravenous Q6H PRN Kristopher Oppenheim, DO   4 mg at 11/27/22 0935   oseltamivir (TAMIFLU) capsule 75 mg  75 mg Oral BID Tat, Shanon Brow, MD   75 mg at 11/29/22 0842   pantoprazole (PROTONIX) injection 40 mg  40 mg Intravenous Q24H Kristopher Oppenheim, DO   40 mg at 11/29/22 4259   potassium chloride 10 mEq in 100 mL IVPB  10 mEq Intravenous Q1 Hr x 3 Tat, David, MD       prochlorperazine (COMPAZINE) injection 5 mg  5 mg Intravenous Nicholes Calamity, MD        Allergies as of 11/25/2022   (No Known Allergies)  Family History  Problem Relation Age of Onset   Colon cancer Neg Hx     Social History   Socioeconomic History   Marital status: Single    Spouse name: Not on file   Number of children: 0   Years of education: Not on file   Highest education level: Not on file  Occupational History   Occupation: unemployed  Tobacco Use   Smoking status: Never   Smokeless tobacco: Former    Types: Snuff   Tobacco comments:    uses dip  Vaping Use   Vaping Use: Never used  Substance and Sexual Activity   Alcohol use: Not Currently   Drug use: No   Sexual activity: Not on file  Other Topics Concern   Not on file  Social History Narrative   Lives with sister.   Social Determinants of Health   Financial Resource Strain: Not on file  Food Insecurity: No Food Insecurity (11/27/2022)   Hunger Vital Sign    Worried About Running Out of Food in the Last Year: Never true    Ran Out of Food in the Last Year: Never true  Transportation Needs: No Transportation Needs (11/27/2022)   PRAPARE - Hydrologist (Medical): No    Lack of Transportation (Non-Medical): No  Physical Activity: Not on file  Stress: Not on  file  Social Connections: Not on file  Intimate Partner Violence: Not At Risk (11/27/2022)   Humiliation, Afraid, Rape, and Kick questionnaire    Fear of Current or Ex-Partner: No    Emotionally Abused: No    Physically Abused: No    Sexually Abused: No    Review of Systems:  As in history of present illness.  Physical Exam: Vital signs in last 24 hours: Temp:  [98 F (36.7 C)-98.8 F (37.1 C)] 98 F (36.7 C) (12/31 1100) Pulse Rate:  [32-101] 101 (12/31 0700) Resp:  [23-45] 25 (12/31 0700) BP: (93-146)/(46-90) 140/71 (12/31 0700) SpO2:  [88 %-100 %] 100 % (12/31 0700) Weight:  [92 kg] 92 kg (12/31 0500) Last BM Date : 11/24/22 General:   Alert,  pleasant and cooperative in NAD; apparently significantly hard of hearing.  Sisters in attendance. Neck:  Supple; no masses or thyromegaly. Abdomen:  Soft, nontender and nondistended. No masses, hepatosplenomegaly or hernias noted. Normal bowel sounds, without guarding, and without rebound.    Intake/Output from previous day: 12/30 0701 - 12/31 0700 In: 940.1 [P.O.:840; IV Piggyback:100.1] Out: 1430 [Urine:1250; Emesis/NG output:180] Intake/Output this shift: No intake/output data recorded.  Lab Results: Recent Labs    11/27/22 0534 11/28/22 0416 11/29/22 0400  WBC 13.5* 14.3* 11.4*  HGB 11.5* 11.8* 11.5*  HCT 35.5* 36.2* 33.9*  PLT 170 173 159   BMET Recent Labs    11/27/22 0534 11/28/22 0416 11/29/22 0400  NA 139 137 139  K 3.7 3.7 3.4*  CL 106 105 101  CO2 '25 25 29  '$ GLUCOSE 179* 146* 144*  BUN 32* 32* 24*  CREATININE 1.07 0.70 0.66  CALCIUM 7.3* 7.4* 7.5*   LFT Recent Labs    11/27/22 0534  PROT 5.1*  ALBUMIN 2.3*  AST 69*  ALT 29  ALKPHOS 54  BILITOT 1.0   PT/INR No results for input(s): "LABPROT", "INR" in the last 72 hours. Hepatitis Panel No results for input(s): "HEPBSAG", "HCVAB", "HEPAIGM", "HEPBIGM" in the last 72 hours. C-Diff No results for input(s): "CDIFFTOX" in the last 72  hours.  Studies/Results: CT CHEST WO  CONTRAST  Result Date: 11/28/2022 CLINICAL DATA:  Pneumonia, complication suspected.  Sepsis.  Fever. EXAM: CT CHEST WITHOUT CONTRAST TECHNIQUE: Multidetector CT imaging of the chest was performed following the standard protocol without IV contrast. RADIATION DOSE REDUCTION: This exam was performed according to the departmental dose-optimization program which includes automated exposure control, adjustment of the mA and/or kV according to patient size and/or use of iterative reconstruction technique. COMPARISON:  Chest radiograph November 25, 2022 and esophagram December 11, 2021 FINDINGS: Cardiovascular: Aortic atherosclerosis. Cardiac enlargement. Small pericardial effusion. Coronary artery calcifications. Mediastinum/Nodes: Prominent mediastinal lymph nodes and hilar nodal tissue not pathologically enlarged by size criteria and favored reactive. Fluid-filled patulous esophagus compatible with findings of achalasia seen on prior esophagram. Lungs/Pleura: Masslike consolidations in the bilateral lower lobes. Additional multifocal airspace opacities in the bilateral upper lobes and right middle lobe. Small bilateral pleural effusions. Upper Abdomen: No acute abnormality. Musculoskeletal: Remote left lateral rib fractures. Multilevel degenerative changes spine. Degenerative change of the bilateral glenohumeral joints. IMPRESSION: 1. Masslike consolidations in the bilateral lower lobes with additional multifocal airspace opacities in the bilateral upper lobes and right middle lobe, favored to reflect multifocal pneumonia. Recommend follow-up imaging to ensure resolution. 2. Small bilateral pleural effusions. 3. Fluid-filled patulous esophagus compatible with findings of achalasia seen on prior esophagram. 4. Cardiac enlargement with small pericardial effusion, consider further evaluation with echocardiography. 5.  Aortic Atherosclerosis (ICD10-I70.0). Electronically Signed    By: Dahlia Bailiff M.D.   On: 11/28/2022 12:11      Impression: 71 year old gentleman with a longstanding esophageal dysphagia admitted to the hospital with recurrent pneumonia(influenza PCR positive + streptococcal pneumonia bacteremia) along with exacerbation of esophageal dysphagia to liquids and solids with associated regurgitation.  I suspect he does have achalasia although it has not been manometrically proven.  Endoscopic findings and clinical course highly suggestive of this entity.  Nothing on recent EGD to suggest pseudo-achalasia.  He is likely experiencing recurrent aspiration from back up in his esophagus.  Having false teeth exacerbates the situation as he likely swallows larger boluses of solid food on a regular basis.  Unfortunately, he has not a candidate for traditional definitive therapy (surgical myotomy, endoscopic myotomy, pneumatic bag dilation).  Smooth muscle dilators including nitroglycerin and calcium channel blockers do not work and are not recommended.  Periodic endoscopic injection of Botox into the lower esophageal sphincter would likely be helpful in this clinical setting.  It is only temporary, however, it often provides patients with significant improvement in swallowing for months after treatment.   Recommendations:  Barium pill esophagram to rule out an occult food impaction and reassess LES narrowing, etc.  Continue PPI once daily empirically (for any GERD /stress ulcer prophylaxis)  Swallowing precautions reviewed with family and patient  Would consider pursuing Botox injection once he is clinically improved-ideally, prior to discharge.  Further recommendations to follow.     Notice:  This dictation was prepared with Dragon dictation along with smaller phrase technology. Any transcriptional errors that result from this process are unintentional and may not be corrected upon review.

## 2022-11-29 NOTE — Progress Notes (Signed)
Responded to nursing call:  chest pain I came to bedside to eval  Subjective: Pt denies cp, sob, abd pain  Vitals:   11/29/22 0600 11/29/22 0700 11/29/22 1100 11/29/22 1600  BP: 126/72 (!) 140/71    Pulse: (!) 43 (!) 101    Resp: (!) 29 (!) 25    Temp:  98.8 F (37.1 C) 98 F (36.7 C) 98.8 F (37.1 C)  TempSrc:  Oral Oral Oral  SpO2: 96% 100%    Weight:      Height:       CV--RRR, no wheeze Lung--scattered rales Abd--soft+BS/NT   Assessment/Plan:  Chest pain -atypical, likely GI etiology with intractable n/v -lasted <5 min -denied CP at my eval -personally reviewed EKG--sinus, PACs, nonspecific T wave flattening, no concerning STT changes -vitals stable    Orson Eva, DO [facility]

## 2022-11-30 DIAGNOSIS — J101 Influenza due to other identified influenza virus with other respiratory manifestations: Secondary | ICD-10-CM | POA: Diagnosis not present

## 2022-11-30 DIAGNOSIS — J9601 Acute respiratory failure with hypoxia: Secondary | ICD-10-CM | POA: Diagnosis not present

## 2022-11-30 DIAGNOSIS — R7881 Bacteremia: Secondary | ICD-10-CM | POA: Diagnosis not present

## 2022-11-30 DIAGNOSIS — E1165 Type 2 diabetes mellitus with hyperglycemia: Secondary | ICD-10-CM | POA: Diagnosis not present

## 2022-11-30 DIAGNOSIS — I4719 Other supraventricular tachycardia: Secondary | ICD-10-CM

## 2022-11-30 LAB — BASIC METABOLIC PANEL
Anion gap: 11 (ref 5–15)
BUN: 19 mg/dL (ref 8–23)
CO2: 28 mmol/L (ref 22–32)
Calcium: 7.6 mg/dL — ABNORMAL LOW (ref 8.9–10.3)
Chloride: 100 mmol/L (ref 98–111)
Creatinine, Ser: 0.61 mg/dL (ref 0.61–1.24)
GFR, Estimated: >60 mL/min (ref >60)
Glucose, Bld: 169 mg/dL — ABNORMAL HIGH (ref 70–99)
Potassium: 3.4 mmol/L — ABNORMAL LOW (ref 3.5–5.1)
Sodium: 139 mmol/L (ref 135–145)

## 2022-11-30 LAB — CBC
HCT: 34.3 % — ABNORMAL LOW (ref 39.0–52.0)
Hemoglobin: 11.5 g/dL — ABNORMAL LOW (ref 13.0–17.0)
MCH: 28.5 pg (ref 26.0–34.0)
MCHC: 33.5 g/dL (ref 30.0–36.0)
MCV: 85.1 fL (ref 80.0–100.0)
Platelets: 159 10*3/uL (ref 150–400)
RBC: 4.03 MIL/uL — ABNORMAL LOW (ref 4.22–5.81)
RDW: 15.3 % (ref 11.5–15.5)
WBC: 7.7 10*3/uL (ref 4.0–10.5)
nRBC: 0 % (ref 0.0–0.2)

## 2022-11-30 LAB — GLUCOSE, CAPILLARY
Glucose-Capillary: 172 mg/dL — ABNORMAL HIGH (ref 70–99)
Glucose-Capillary: 158 mg/dL — ABNORMAL HIGH (ref 70–99)
Glucose-Capillary: 181 mg/dL — ABNORMAL HIGH (ref 70–99)
Glucose-Capillary: 206 mg/dL — ABNORMAL HIGH (ref 70–99)

## 2022-11-30 LAB — MAGNESIUM: Magnesium: 2 mg/dL (ref 1.7–2.4)

## 2022-11-30 MED ORDER — POTASSIUM CHLORIDE 10 MEQ/100ML IV SOLN
10.0000 meq | INTRAVENOUS | Status: AC
  Administered 2022-11-30 (×3): 10 meq via INTRAVENOUS
  Filled 2022-11-30 (×3): qty 100

## 2022-11-30 MED ORDER — FUROSEMIDE 10 MG/ML IJ SOLN
40.0000 mg | Freq: Every day | INTRAMUSCULAR | Status: DC
  Administered 2022-11-30 – 2022-12-01 (×2): 40 mg via INTRAVENOUS
  Filled 2022-11-30 (×2): qty 4

## 2022-11-30 NOTE — Progress Notes (Signed)
Speech Language Pathology Treatment: Dysphagia  Patient Details Name: Henry [name] MRN: [id number] DOB: 11-16-1951 Today's Date: 11/30/2022 Time: 0950-1006 SLP Time Calculation (min) (ACUTE ONLY): 16 min  Assessment / Plan / Recommendation Clinical Impression  Pt seen in room with sister present with breakfast meal. He belched and coughed after meal and expectorated regurgitated material. Pt scheduled for BPE tomorrow. Pt with reduced mastication of some solids and resultant oral residue on the left side. He was able to clear this with liquid wash. Given severity of esophageal dysphagia (suspected), will downgrade to D2 after confirmation from sister and continue thin liquids. SLP will follow pending results of BPE.    HPI HPI: 72 year old male with a history of hearing impairment, esophageal dysphagia, GERD, diabetes mellitus type 2 presenting with fever, coughing, and vomiting for the last 2 to 3 days. Influenza PCR was positive.  The patient became increasingly hypoxemic requiring high flow nasal cannula up to 15 L. Pt with known hx of esophageal dysphagia and most recent upper GI endoscopy on 12/30/21 with report noting: "Dilation in the upper third of the esophagus and in the middle third of the esophagus.  - The examination was suspicious for achalasia. Dilated." Pt was reportedly vomiting after all PO in the ED and BSE was requested.      SLP Plan  Continue with current plan of care      Recommendations for follow up therapy are one component of a multi-disciplinary discharge planning process, led by the attending physician.  Recommendations may be updated based on patient status, additional functional criteria and insurance authorization.    Recommendations  Diet recommendations: Dysphagia 2 (fine chop);Thin liquid Liquids provided via: Cup Medication Administration: Whole meds with puree Supervision: Patient able to self feed;Intermittent supervision to cue for compensatory  strategies Compensations: Minimize environmental distractions;Slow rate;Small sips/bites Postural Changes and/or Swallow Maneuvers: Seated upright 90 degrees;Upright 30-60 min after meal                Oral Care Recommendations: Oral care BID Follow Up Recommendations: No SLP follow up Assistance recommended at discharge: Intermittent Supervision/Assistance SLP Visit Diagnosis: Dysphagia, unspecified (R13.10) Plan: Continue with current plan of care       Thank you,  Genene Churn, [place]    [place]  11/30/2022, 10:23 AM

## 2022-11-30 NOTE — Care Management Important Message (Signed)
Important Message  Patient Details  Name: Henry [name] MRN: [id number] Date of Birth: [birth year]   Medicare Important Message Given:  Other (see comment)  Attempted to reach patient via room phone to review Medicare IM, however unable to reach upon attempt.  Left message with Alden Server, sister, at [phone], encouraged callback.     Dannette Barbara 11/30/2022, 3:19 PM

## 2022-11-30 NOTE — Progress Notes (Signed)
PROGRESS NOTE  ZYRUS HETLAND UXN:[id number] DOB: 02-Jun-1951 DOA: 11/25/2022 PCP: Curlene Labrum, MD  Brief History:  72 year old male with a history of hearing impairment, esophageal dysphagia, GERD, diabetes mellitus type 2 presenting with fever, coughing, and vomiting for the last 2 to 3 days.  He has had increasing shortness of breath.  Apparently multiple family members have been sick. Reportedly patient lives with his sister. Caregiver is not here in the ER now.   Bedside RN states that family members took the patient's hearing aids home.  Patient has severe hearing loss.  Difficult to communicate.   In the ED, the patient was febrile up to 101.2 F with soft blood pressure down to 90/62.  Oxygen saturation was 85% room air. WBC 7.4, hemoglobin 12.9, platelets 171,000.  BMP showed sodium 130, potassium 3.9, bicarbonate 25, serum creatinine 0.79.  AST 71, ALT 32, alk phosphatase 84, total bilirubin 1.2.  Lactic acid peaked at 4.6.  The patient was started on oseltamavir.  Influenza PCR was positive. The patient became increasingly hypoxemic requiring high flow nasal cannula up to 15 L.  The patient has slowly improved with IV abx.  He was found to have acute HFrEF with EF 25-30% and started on IV lasix. His hospitalization has been prolonged by his slow improvement as well as his intractable vomiting and SVT/atrial tachycardia.   Assessment/Plan: Severe Sepsis -present on admission -fever, tachycardia, AMS, resp failure -due to bacteremia, pneumonia -PCT 9.33 -sepsis physiology resolved   Streptococcus pneumonia bacteremia -source = pneumonia -personally reviewed CXR--bilateral patchy opacities R>L -initially on Unasyn>>changed to ceftriaxone 2 g IV daily   Lobar Pneumonia -there is a component of aspiration pneumonitis -initially on unasyn>>ceftriaxone   Acute respiratory failure with hypoxia -11/26/22 ABG 7.4 2/37/188/24 (15L) -now down to 4L -Secondary to  influenza, pulm edema, pneumonia -has component of aspiration pneumonitis with a history of esophageal dysphagia (Achalasia) and vomiting -Wean oxygen as tolerated -The patient does have signs of volume overload -BNP 2649 -Saline lock IV fluids   Acute HFrEF -12/28 Echo--EF 35-30% -redose IV lasix -appreciate cardiology -low BP limiting GDMP presently>>initiate gradually as BP improves -12/30 ReDS vest reading--44   Atrial tachycardia -personally reviewed EKG>>atrial tach -continue digoxin for now -add lopressor IV now that hypotension is improved -if persists, may need amiodarone -optimize electrolytes -still having occasional non-sustained short runs   Influenza A -continue oseltamavir -Check PCT 9.33 -Clear liquid diet initially given the patient's vomiting   Esophageal dysphagia/Achalasia/Intractable vomiting -Given the patient's history of this and respiratory failure, concern about aspiration pneumonitis -seen by speech>>dys 3 with thin -start compazine around the clock -GI consult appreciated -planning barium pill esophagram 12/01/22   Lactic acidosis -Primarily driven by the patient's hypoxia and sepsis   Diabetes mellitus type 2 with hyperglycemia -Check A1c--8.3 -NovoLog sliding scale -Holding metformin   GERD -Continue pantoprazole   Hypomagnesemia/Hypokalemia -repleted           Family Communication:   sister updated 11/30/22 Consultants:  cardiology   Code Status:  DNR   DVT Prophylaxis:  [place] Heparin     Procedures: As Listed in Progress Note Above   Antibiotics: Unasyn 12/28>>12/29 Ceftriaxone 12/29>> Azithro 12/28>>12/29              Subjective: Patient denies fevers, chills, headache, chest pain, dyspnea, nausea, vomiting, diarrhea, abdominal pain, dysuria, hematuria, hematochezia, and melena.   Objective: Vitals:   11/30/22 0325 11/30/22 0412 11/30/22  0500 11/30/22 1550  BP: 128/70   114/65  Pulse: 81 72  90  Resp: (!)  38 (!) 26  (!) 23  Temp: 97.6 F (36.4 C)   (!) 97.5 F (36.4 C)  TempSrc:    Oral  SpO2: 97%   96%  Weight:   84 kg   Height:        Intake/Output Summary (Last 24 hours) at 11/30/2022 1644 Last data filed at 11/30/2022 0086 Gross per 24 hour  Intake 476.69 ml  Output 3050 ml  Net -2573.31 ml   Weight change: -8 kg Exam:  General:  Pt is alert, follows commands appropriately, not in acute distress HEENT: No icterus, No thrush, No neck mass, [place]/AT Cardiovascular: RRR, S1/S2, no rubs, no gallops +PACs Respiratory: bilateral rales. No wheeze Abdomen: Soft/+BS, non tender, non distended, no guarding Extremities: 1 + LE edema, No lymphangitis, No petechiae, No rashes, no synovitis   Data Reviewed: I have personally reviewed following labs and imaging studies Basic Metabolic Panel: Recent Labs  Lab 11/26/22 0450 11/27/22 0534 11/28/22 0416 11/29/22 0400 11/30/22 0617  NA 140 139 137 139 139  K 4.2 3.7 3.7 3.4* 3.4*  CL 107 106 105 101 100  CO2 _0 GLUCOSE 166* 179* 146* 144* 169*  BUN 19 32* 32* 24* 19  CREATININE 0.81 1.07 0.70 0.66 0.61  CALCIUM 8.0* 7.3* 7.4* 7.5* 7.6*  MG 1.6* 1.9 2.0 1.9 2.0  PHOS  --  2.9  --  3.2  --    Liver Function Tests: Recent Labs  Lab 11/25/22 1952 11/26/22 0450 11/27/22 0534  AST 71* 79* 69*  ALT 32 34 29  ALKPHOS 84 72 54  BILITOT 1.2 0.8 1.0  PROT 7.3 6.1* 5.1*  ALBUMIN 3.5 3.0* 2.3*   No results for input(s): "LIPASE", "AMYLASE" in the last 168 hours. No results for input(s): "AMMONIA" in the last 168 hours. Coagulation Profile: Recent Labs  Lab 11/25/22 1952  INR 1.3*   CBC: Recent Labs  Lab 11/25/22 1952 11/26/22 0450 11/27/22 0534 11/28/22 0416 11/29/22 0400 11/30/22 0617  WBC 7.4 12.4* 13.5* 14.3* 11.4* 7.7  NEUTROABS 6.2 9.4*  --   --   --   --   HGB 12.9* 12.5* 11.5* 11.8* 11.5* 11.5*  HCT 40.2 38.9* 35.5* 36.2* 33.9* 34.3*  MCV 88.4 89.4 87.7 87.7 86.5 85.1  PLT 171 165 170 173 159 159    Cardiac Enzymes: No results for input(s): "CKTOTAL", "CKMB", "CKMBINDEX", "TROPONINI" in the last 168 hours. BNP: Invalid input(s): "POCBNP" CBG: Recent Labs  Lab 11/29/22 1132 11/29/22 1609 11/29/22 2219 11/30/22 0824 11/30/22 1135  GLUCAP 222* 229* 130* 172* 158*   HbA1C: No results for input(s): "HGBA1C" in the last 72 hours. Urine analysis:    Component Value Date/Time   COLORURINE AMBER (A) 11/26/2022 0108   APPEARANCEUR HAZY (A) 11/26/2022 0108   LABSPEC 1.025 11/26/2022 0108   PHURINE 5.0 11/26/2022 0108   GLUCOSEU 50 (A) 11/26/2022 0108   HGBUR NEGATIVE 11/26/2022 0108   BILIRUBINUR NEGATIVE 11/26/2022 0108   KETONESUR 5 (A) 11/26/2022 0108   PROTEINUR >=300 (A) 11/26/2022 0108   NITRITE NEGATIVE 11/26/2022 0108   LEUKOCYTESUR NEGATIVE 11/26/2022 0108   Sepsis Labs: _1 (procalcitonin:4,lacticidven:4) ) Recent Results (from the past 240 hour(s))  Culture, blood (Routine x 2)     Status: Abnormal   Collection Time: 11/25/22  7:52 PM   Specimen: BLOOD  Result Value Ref Range Status  Specimen Description   Final    BLOOD LEFT ANTECUBITAL Performed at [facility], [address], [place], [place] [address]    Special Requests   Final    BOTTLES DRAWN AEROBIC AND ANAEROBIC Blood Culture adequate volume Performed at [facility], [address] [place], [place] [address]    Culture  Setup Time   Final    GRAM POSITIVE COCCI BOTTLES DRAWN AEROBIC AND ANAEROBIC Gram Stain Report Called to,Read Back By and Verified With: BASS,V _0  BY MATTHEWS, B 12.28.2023 CRITICAL RESULT CALLED TO, READ BACK BY AND VERIFIED WITHMaralyn Sago [facility] [id number] JRS Performed at [facility], [place] [address], [place], [place] [address]    Culture STREPTOCOCCUS PNEUMONIAE (A)  Final   Report Status 11/28/2022 FINAL  Final   Organism ID, Bacteria STREPTOCOCCUS PNEUMONIAE  Final      Susceptibility   Streptococcus pneumoniae - MIC*    ERYTHROMYCIN <=0.12  SENSITIVE Sensitive     LEVOFLOXACIN 0.5 SENSITIVE Sensitive     VANCOMYCIN <=0.12 SENSITIVE Sensitive     PENO - penicillin <=0.06      PENICILLIN (non-meningitis) <=0.06 SENSITIVE Sensitive     PENICILLIN (oral) <=0.06 SENSITIVE Sensitive     CEFTRIAXONE (meningitis) <=0.12 SENSITIVE Sensitive     * STREPTOCOCCUS PNEUMONIAE  Blood Culture ID Panel (Reflexed)     Status: Abnormal   Collection Time: 11/25/22  7:52 PM  Result Value Ref Range Status   Enterococcus faecalis NOT DETECTED NOT DETECTED Final   Enterococcus Faecium NOT DETECTED NOT DETECTED Final   Listeria monocytogenes NOT DETECTED NOT DETECTED Final   Staphylococcus species NOT DETECTED NOT DETECTED Final   Staphylococcus aureus (BCID) NOT DETECTED NOT DETECTED Final   Staphylococcus epidermidis NOT DETECTED NOT DETECTED Final   Staphylococcus lugdunensis NOT DETECTED NOT DETECTED Final   Streptococcus species DETECTED (A) NOT DETECTED Final    Comment: PHARMD STEVEN HURSH [id number] JRS   Streptococcus agalactiae NOT DETECTED NOT DETECTED Final   Streptococcus pneumoniae DETECTED (A) NOT DETECTED Final    Comment: PHARMD STEVEN [place] [id number] JRS   Streptococcus pyogenes NOT DETECTED NOT DETECTED Final   A.calcoaceticus-baumannii NOT DETECTED NOT DETECTED Final   Bacteroides fragilis NOT DETECTED NOT DETECTED Final   Enterobacterales NOT DETECTED NOT DETECTED Final   Enterobacter cloacae complex NOT DETECTED NOT DETECTED Final   Escherichia coli NOT DETECTED NOT DETECTED Final   Klebsiella aerogenes NOT DETECTED NOT DETECTED Final   Klebsiella oxytoca NOT DETECTED NOT DETECTED Final   Klebsiella pneumoniae NOT DETECTED NOT DETECTED Final   Proteus species NOT DETECTED NOT DETECTED Final   Salmonella species NOT DETECTED NOT DETECTED Final   Serratia marcescens NOT DETECTED NOT DETECTED Final   Haemophilus influenzae NOT DETECTED NOT DETECTED Final   Neisseria meningitidis NOT DETECTED NOT DETECTED Final    Pseudomonas aeruginosa NOT DETECTED NOT DETECTED Final   Stenotrophomonas maltophilia NOT DETECTED NOT DETECTED Final   Candida albicans NOT DETECTED NOT DETECTED Final   Candida auris NOT DETECTED NOT DETECTED Final   Candida glabrata NOT DETECTED NOT DETECTED Final   Candida krusei NOT DETECTED NOT DETECTED Final   Candida parapsilosis NOT DETECTED NOT DETECTED Final   Candida tropicalis NOT DETECTED NOT DETECTED Final   Cryptococcus neoformans/gattii NOT DETECTED NOT DETECTED Final    Comment: Performed at [facility], [address], [place], [place] [address]  Culture, blood (Routine x 2)     Status: Abnormal   Collection  Time: 11/25/22  7:53 PM   Specimen: BLOOD  Result Value Ref Range Status   Specimen Description   Final    BLOOD RIGHT ANTECUBITAL Performed at [facility], [address], [place], [place] [address]    Special Requests   Final    BOTTLES DRAWN AEROBIC AND ANAEROBIC Blood Culture adequate volume Performed at [place] [place], [place] [address]    Culture  Setup Time   Final    GRAM POSITIVE COCCI BOTTLES DRAWN AEROBIC AND ANAEROBIC Gram Stain Report Called to,Read Back By and Verified With: BASS,V_0  BY MATTHEWS,B 12.28.2023 IN BOTH AEROBIC AND ANAEROBIC BOTTLES    Culture (A)  Final    STREPTOCOCCUS PNEUMONIAE SUSCEPTIBILITIES PERFORMED ON PREVIOUS CULTURE WITHIN THE LAST 5 DAYS. Performed at [facility], [place] [address], [place], [place] [address]    Report Status 11/28/2022 FINAL  Final  Resp panel by RT-PCR (RSV, Flu A&B, Covid) Anterior Nasal Swab     Status: Abnormal   Collection Time: 11/25/22  8:05 PM   Specimen: Anterior Nasal Swab  Result Value Ref Range Status   SARS Coronavirus 2 by RT PCR NEGATIVE NEGATIVE Final    Comment: (NOTE) SARS-CoV-2 target nucleic acids are NOT DETECTED.  The SARS-CoV-2 RNA is generally detectable in upper respiratory specimens during the acute phase of infection. The  lowest concentration of SARS-CoV-2 viral copies this assay can detect is 138 copies/mL. A negative result does not preclude SARS-Cov-2 infection and should not be used as the sole basis for treatment or other patient management decisions. A negative result may occur with  improper specimen collection/handling, submission of specimen other than nasopharyngeal swab, presence of viral mutation(s) within the areas targeted by this assay, and inadequate number of viral copies(<138 copies/mL). A negative result must be combined with clinical observations, patient history, and epidemiological information. The expected result is Negative.  Fact Sheet for Patients:  EntrepreneurPulse.com.au  Fact Sheet for Healthcare Providers:  IncredibleEmployment.be  This test is no t yet approved or cleared by the [place] FDA and  has been authorized for detection and/or diagnosis of SARS-CoV-2 by FDA under an Emergency Use Authorization (EUA). This EUA will remain  in effect (meaning this test can be used) for the duration of the COVID-19 declaration under Section 564(b)(1) of the Act, 21 U.S.C.section 360bbb-3(b)(1), unless the authorization is terminated  or revoked sooner.       Influenza A by PCR POSITIVE (A) NEGATIVE Final   Influenza B by PCR NEGATIVE NEGATIVE Final    Comment: (NOTE) The Xpert Xpress SARS-CoV-2/FLU/RSV plus assay is intended as an aid in the diagnosis of influenza from Nasopharyngeal swab specimens and should not be used as a sole basis for treatment. Nasal washings and aspirates are unacceptable for Xpert Xpress SARS-CoV-2/FLU/RSV testing.  Fact Sheet for Patients: EntrepreneurPulse.com.au  Fact Sheet for Healthcare Providers: IncredibleEmployment.be  This test is not yet approved or cleared by the [place] FDA and has been authorized for detection and/or diagnosis of SARS-CoV-2 by FDA  under an Emergency Use Authorization (EUA). This EUA will remain in effect (meaning this test can be used) for the duration of the COVID-19 declaration under Section 564(b)(1) of the Act, 21 U.S.C. section 360bbb-3(b)(1), unless the authorization is terminated or revoked.     Resp Syncytial Virus by PCR NEGATIVE NEGATIVE Final    Comment: (NOTE) Fact Sheet for Patients: EntrepreneurPulse.com.au  Fact Sheet for Healthcare Providers: IncredibleEmployment.be  This test is not yet  approved or cleared by the [place] and has been authorized for detection and/or diagnosis of SARS-CoV-2 by FDA under an Emergency Use Authorization (EUA). This EUA will remain in effect (meaning this test can be used) for the duration of the COVID-19 declaration under Section 564(b)(1) of the Act, 21 U.S.C. section 360bbb-3(b)(1), unless the authorization is terminated or revoked.  Performed at [facility], [address], [place], [place] [address]   MRSA Next Gen by PCR, Nasal     Status: None   Collection Time: 11/27/22 11:56 AM   Specimen: Nasal Mucosa; Nasal Swab  Result Value Ref Range Status   MRSA by PCR Next Gen NOT DETECTED NOT DETECTED Final    Comment: (NOTE) The GeneXpert MRSA Assay (FDA approved for NASAL specimens only), is one component of a comprehensive MRSA colonization surveillance program. It is not intended to diagnose MRSA infection nor to guide or monitor treatment for MRSA infections. Test performance is not FDA approved in patients less than 65 years old. Performed at [facility], [address], [place], [place] [address]      Scheduled Meds:  Chlorhexidine Gluconate Cloth  6 each Topical Daily   digoxin  0.125 mg Oral Daily   furosemide  40 mg Intravenous Daily   heparin  5,000 Units Subcutaneous Q8H   insulin aspart  0-5 Units Subcutaneous QHS   insulin aspart  0-9 Units Subcutaneous TID WC   metoprolol tartrate  2.5 mg  Intravenous Q6H   ondansetron  4 mg Intravenous Once   oseltamivir  75 mg Oral BID   pantoprazole (PROTONIX) IV  40 mg Intravenous Q24H   prochlorperazine  5 mg Intravenous Q6H   Continuous Infusions:  cefTRIAXone (ROCEPHIN)  IV 2 g (11/29/22 1835)    Procedures/Studies: CT CHEST WO CONTRAST  Result Date: 11/28/2022 CLINICAL DATA:  Pneumonia, complication suspected.  Sepsis.  Fever. EXAM: CT CHEST WITHOUT CONTRAST TECHNIQUE: Multidetector CT imaging of the chest was performed following the standard protocol without IV contrast. RADIATION DOSE REDUCTION: This exam was performed according to the departmental dose-optimization program which includes automated exposure control, adjustment of the mA and/or kV according to patient size and/or use of iterative reconstruction technique. COMPARISON:  Chest radiograph November 25, 2022 and esophagram December 11, 2021 FINDINGS: Cardiovascular: Aortic atherosclerosis. Cardiac enlargement. Small pericardial effusion. Coronary artery calcifications. Mediastinum/Nodes: Prominent mediastinal lymph nodes and hilar nodal tissue not pathologically enlarged by size criteria and favored reactive. Fluid-filled patulous esophagus compatible with findings of achalasia seen on prior esophagram. Lungs/Pleura: Masslike consolidations in the bilateral lower lobes. Additional multifocal airspace opacities in the bilateral upper lobes and right middle lobe. Small bilateral pleural effusions. Upper Abdomen: No acute abnormality. Musculoskeletal: Remote left lateral rib fractures. Multilevel degenerative changes spine. Degenerative change of the bilateral glenohumeral joints. IMPRESSION: 1. Masslike consolidations in the bilateral lower lobes with additional multifocal airspace opacities in the bilateral upper lobes and right middle lobe, favored to reflect multifocal pneumonia. Recommend follow-up imaging to ensure resolution. 2. Small bilateral pleural effusions. 3. Fluid-filled  patulous esophagus compatible with findings of achalasia seen on prior esophagram. 4. Cardiac enlargement with small pericardial effusion, consider further evaluation with echocardiography. 5.  Aortic Atherosclerosis (ICD10-I70.0). Electronically Signed   By: Dahlia Bailiff M.D.   On: 11/28/2022 12:11   ECHOCARDIOGRAM COMPLETE  Result Date: 11/26/2022    ECHOCARDIOGRAM REPORT   Patient Name:   Henry [name] Date of Exam: 11/26/2022 Medical Rec #:  [id number]    Height:  71.0 in Accession #:    [id number]   Weight:       200.6 lb Date of Birth:  11-27-1951    BSA:          2.111 m Patient Age:    81 years     BP:           103/64 mmHg Patient Gender: M            HR:           102 bpm. Exam Location:  [facility] Procedure: 2D Echo, Cardiac Doppler and Color Doppler Indications:    CHF-Acute Diastolic E36.62  History:        Patient has no prior history of Echocardiogram examinations.                 Risk Factors:Diabetes. Acute respiratory failure with hypoxia                 and Positive for FLU A at present, Mental disability (From Hx).  Sonographer:    Alvino Chapel RCS Referring Phys: [phone] Nolyn Swab  Sonographer Comments: Patient could NOT follow instructions to "sniff". IMPRESSIONS  1. Global hypokinesis worse in the inferior base . Left ventricular ejection fraction, by estimation, is 25 to 30%. The left ventricle has severely decreased function. The left ventricle demonstrates global hypokinesis. The left ventricular internal cavity size was moderately dilated. Left ventricular diastolic parameters are indeterminate.  2. Some septal flattening suggesting elevated PA pressure TR velocity inadequate to estimate . Right ventricular systolic function is mildly reduced. The right ventricular size is mildly enlarged.  3. Left atrial size was moderately dilated.  4. The mitral valve is normal in structure. Trivial mitral valve regurgitation. No evidence of mitral stenosis.  5. The aortic valve is tricuspid.  Aortic valve regurgitation is not visualized. No aortic stenosis is present.  6. The inferior vena cava is dilated in size with >50% respiratory variability, suggesting right atrial pressure of 8 mmHg. FINDINGS  Left Ventricle: Global hypokinesis worse in the inferior base. Left ventricular ejection fraction, by estimation, is 25 to 30%. The left ventricle has severely decreased function. The left ventricle demonstrates global hypokinesis. Definity contrast agent was given IV to delineate the left ventricular endocardial borders. The left ventricular internal cavity size was moderately dilated. There is no left ventricular hypertrophy. Left ventricular diastolic parameters are indeterminate. Right Ventricle: Some septal flattening suggesting elevated PA pressure TR velocity inadequate to estimate. The right ventricular size is mildly enlarged. No increase in right ventricular wall thickness. Right ventricular systolic function is mildly reduced. Left Atrium: Left atrial size was moderately dilated. Right Atrium: Right atrial size was normal in size. Pericardium: There is no evidence of pericardial effusion. Mitral Valve: The mitral valve is normal in structure. Trivial mitral valve regurgitation. No evidence of mitral valve stenosis. Tricuspid Valve: The tricuspid valve is normal in structure. Tricuspid valve regurgitation is mild . No evidence of tricuspid stenosis. Aortic Valve: The aortic valve is tricuspid. Aortic valve regurgitation is not visualized. No aortic stenosis is present. Pulmonic Valve: The pulmonic valve was normal in structure. Pulmonic valve regurgitation is trivial. No evidence of pulmonic stenosis. Aorta: The aortic root is normal in size and structure. Venous: The inferior vena cava is dilated in size with greater than 50% respiratory variability, suggesting right atrial pressure of 8 mmHg. IAS/Shunts: No atrial level shunt detected by color flow Doppler.  LEFT VENTRICLE PLAX 2D LVIDd:  5.60 cm      Diastology LVIDs:         4.60 cm      LV e' medial:    6.64 cm/s LV PW:         1.00 cm      LV E/e' medial:  12.2 LV IVS:        1.10 cm      LV e' lateral:   8.27 cm/s LVOT diam:     2.00 cm      LV E/e' lateral: 9.8 LV SV:         52 LV SV Index:   25 LVOT Area:     3.14 cm  LV Volumes (MOD) LV vol d, MOD A2C: 116.0 ml LV vol d, MOD A4C: 126.0 ml LV vol s, MOD A2C: 92.4 ml LV vol s, MOD A4C: 81.5 ml LV SV MOD A2C:     23.6 ml LV SV MOD A4C:     126.0 ml LV SV MOD BP:      37.7 ml RIGHT VENTRICLE RV S prime:     12.65 cm/s TAPSE (M-mode): 1.5 cm LEFT ATRIUM             Index        RIGHT ATRIUM           Index LA diam:        4.40 cm 2.08 cm/m   RA Area:     24.70 cm LA Vol (A2C):   70.4 ml 33.34 ml/m  RA Volume:   82.00 ml  38.84 ml/m LA Vol (A4C):   65.2 ml 30.88 ml/m LA Biplane Vol: 74.5 ml 35.29 ml/m  AORTIC VALVE LVOT Vmax:   97.60 cm/s LVOT Vmean:  74.900 cm/s LVOT VTI:    0.166 m  AORTA Ao Root diam: 3.50 cm MITRAL VALVE               TRICUSPID VALVE MV Area (PHT): 4.94 cm    TR Peak grad:   40.7 mmHg MV Decel Time: 154 msec    TR Vmax:        319.00 cm/s MV E velocity: 80.70 cm/s MV A velocity: 53.30 cm/s  SHUNTS MV E/A ratio:  1.51        Systemic VTI:  0.17 m                            Systemic Diam: 2.00 cm Jenkins Rouge MD Electronically signed by Jenkins Rouge MD Signature Date/Time: 11/26/2022/4:51:55 PM    Final    DG Chest Port 1 View  Result Date: 11/25/2022 CLINICAL DATA:  Shortness of breath. EXAM: PORTABLE CHEST 1 VIEW COMPARISON:  Chest radiograph dated 04/15/2022. FINDINGS: Bilateral mid to lower lung field confluent nodular densities most consistent with developing infiltrate. Trace left pleural effusion suspected. No pneumothorax. Stable cardiomegaly. No acute osseous pathology. IMPRESSION: Bilateral mid to lower lung field developing infiltrate. Electronically Signed   By: Anner Crete M.D.   On: 11/25/2022 19:19    Orson Eva, DO  [facility]  If  7PM-7AM, please contact night-coverage [email or web address] Password [email or web address] 11/30/2022, 4:44 PM   LOS: 4 days

## 2022-11-30 NOTE — Progress Notes (Signed)
   11/30/22 0325  Assess: MEWS Score  Temp 97.6 F (36.4 C)  BP 128/70  MAP (mmHg) 84  Pulse Rate 81  Resp (!) 38  SpO2 97 %  O2 Device Nasal Cannula  O2 Flow Rate (L/min) 5 L/min  Assess: MEWS Score  MEWS Temp 0  MEWS Systolic 0  MEWS Pulse 0  MEWS RR 3  MEWS LOC 0  MEWS Score 3  MEWS Score Color Yellow  Assess: if the MEWS score is Yellow or Red  Were vital signs taken at a resting state? Yes  Does the patient meet 2 or more of the SIRS criteria? No  Treat  Pain Scale 0-10  Pain Score 0  Take Vital Signs  Increase Vital Sign Frequency  Yellow: Q 2hr X 2 then Q 4hr X 2, if remains yellow, continue Q 4hrs  Notify: Charge Nurse/RN  Name of Charge Nurse/RN Notified Grace Isaac, RN  Date Charge Nurse/RN Notified 11/30/22  Time Charge Nurse/RN Notified [phone]  Provider Notification  Provider Name/Title Dr. Josephine Cables  Date Provider Notified 11/30/22  Time Provider Notified 0410  Notification Reason Change in status  Assess: SIRS CRITERIA  SIRS Temperature  0  SIRS Pulse 0  SIRS Respirations  1  SIRS WBC 0  SIRS Score Sum  1   Vital signs were taken after patient was moved from ICU to 300.

## 2022-12-01 ENCOUNTER — Inpatient Hospital Stay (HOSPITAL_COMMUNITY): Payer: Medicare Other

## 2022-12-01 DIAGNOSIS — E1165 Type 2 diabetes mellitus with hyperglycemia: Secondary | ICD-10-CM | POA: Diagnosis not present

## 2022-12-01 DIAGNOSIS — I5020 Unspecified systolic (congestive) heart failure: Secondary | ICD-10-CM | POA: Diagnosis not present

## 2022-12-01 DIAGNOSIS — R7881 Bacteremia: Secondary | ICD-10-CM | POA: Diagnosis not present

## 2022-12-01 DIAGNOSIS — R6520 Severe sepsis without septic shock: Secondary | ICD-10-CM

## 2022-12-01 DIAGNOSIS — G9341 Metabolic encephalopathy: Secondary | ICD-10-CM

## 2022-12-01 DIAGNOSIS — J101 Influenza due to other identified influenza virus with other respiratory manifestations: Secondary | ICD-10-CM | POA: Diagnosis not present

## 2022-12-01 DIAGNOSIS — A403 Sepsis due to Streptococcus pneumoniae: Secondary | ICD-10-CM | POA: Diagnosis not present

## 2022-12-01 LAB — GLUCOSE, CAPILLARY
Glucose-Capillary: 170 mg/dL — ABNORMAL HIGH (ref 70–99)
Glucose-Capillary: 194 mg/dL — ABNORMAL HIGH (ref 70–99)
Glucose-Capillary: 184 mg/dL — ABNORMAL HIGH (ref 70–99)
Glucose-Capillary: 170 mg/dL — ABNORMAL HIGH (ref 70–99)

## 2022-12-01 LAB — BASIC METABOLIC PANEL
Anion gap: 10 (ref 5–15)
BUN: 15 mg/dL (ref 8–23)
CO2: 31 mmol/L (ref 22–32)
Calcium: 7.4 mg/dL — ABNORMAL LOW (ref 8.9–10.3)
Chloride: 98 mmol/L (ref 98–111)
Creatinine, Ser: 0.53 mg/dL — ABNORMAL LOW (ref 0.61–1.24)
GFR, Estimated: >60 mL/min (ref >60)
Glucose, Bld: 161 mg/dL — ABNORMAL HIGH (ref 70–99)
Potassium: 3.2 mmol/L — ABNORMAL LOW (ref 3.5–5.1)
Sodium: 139 mmol/L (ref 135–145)

## 2022-12-01 LAB — DIGOXIN LEVEL: Digoxin Level: 0.5 ng/mL — ABNORMAL LOW (ref 0.8–2.0)

## 2022-12-01 LAB — MAGNESIUM: Magnesium: 1.8 mg/dL (ref 1.7–2.4)

## 2022-12-01 MED ORDER — DIGOXIN 0.25 MG/ML IJ SOLN
0.1250 mg | Freq: Every day | INTRAMUSCULAR | Status: DC
  Administered 2022-12-01 – 2022-12-03 (×3): 0.125 mg via INTRAVENOUS
  Filled 2022-12-01 (×4): qty 2

## 2022-12-01 MED ORDER — POTASSIUM CHLORIDE 10 MEQ/100ML IV SOLN
10.0000 meq | INTRAVENOUS | Status: AC
  Administered 2022-12-01 (×3): 10 meq via INTRAVENOUS
  Filled 2022-12-01 (×3): qty 100

## 2022-12-01 MED ORDER — METOPROLOL TARTRATE 5 MG/5ML IV SOLN
5.0000 mg | Freq: Four times a day (QID) | INTRAVENOUS | Status: DC
  Administered 2022-12-01 – 2022-12-05 (×15): 5 mg via INTRAVENOUS
  Filled 2022-12-01 (×16): qty 5

## 2022-12-01 NOTE — Progress Notes (Signed)
Progress Note  Patient Name: Henry [name] Date of Encounter: 12/01/2022  Primary Cardiologist: Jenkins Rouge, MD  Subjective   Reports feeling weak, still having trouble with dysphagia with workup pending per GI.  No chest pain or shortness of breath reported at rest.  No palpitations.  Inpatient Medications    Scheduled Meds:  Chlorhexidine Gluconate Cloth  6 each Topical Daily   digoxin  0.125 mg Oral Daily   furosemide  40 mg Intravenous Daily   heparin  5,000 Units Subcutaneous Q8H   insulin aspart  0-5 Units Subcutaneous QHS   insulin aspart  0-9 Units Subcutaneous TID WC   metoprolol tartrate  2.5 mg Intravenous Q6H   ondansetron  4 mg Intravenous Once   pantoprazole (PROTONIX) IV  40 mg Intravenous Q24H   prochlorperazine  5 mg Intravenous Q6H   Continuous Infusions:  cefTRIAXone (ROCEPHIN)  IV 2 g (11/30/22 2108)   PRN Meds: acetaminophen **OR** acetaminophen, ipratropium-albuterol, melatonin, ondansetron **OR** ondansetron (ZOFRAN) IV   Vital Signs    Vitals:   11/30/22 1550 11/30/22 1936 12/01/22 0500 12/01/22 0527  BP: 114/65 132/72  126/78  Pulse: 90 80  85  Resp: (!) 23 (!) 24  16  Temp: (!) 97.5 F (36.4 C) 99 F (37.2 C)  98.3 F (36.8 C)  TempSrc: Oral     SpO2: 96% 96%  95%  Weight:   86.3 kg   Height:        Intake/Output Summary (Last 24 hours) at 12/01/2022 1038 Last data filed at 12/01/2022 0100 Gross per 24 hour  Intake 580 ml  Output 1750 ml  Net -1170 ml   Filed Weights   11/29/22 0500 11/30/22 0500 12/01/22 0500  Weight: 92 kg 84 kg 86.3 kg    Telemetry    Sinus rhythm with bursts of SVT and NSVT, no sustained arrhythmias or pauses.  Personally reviewed.  ECG    No ECG reviewed.  Physical Exam   GEN: No acute distress.   Neck: No JVD. Cardiac: RRR with ectopy, 1/6 systolic murmur.  Respiratory: Coarse breath sounds with crackles mid to lower lung zones bilaterally. GI: Soft, nontender, bowel sounds present. MS: No  edema; No deformity.  Labs    Chemistry Recent Labs  Lab 11/25/22 1952 11/26/22 0450 11/27/22 0534 11/28/22 0416 11/29/22 0400 11/30/22 0617 12/01/22 0407  NA 138 140 139   < > 139 139 139  K 3.9 4.2 3.7   < > 3.4* 3.4* 3.2*  CL 101 107 106   < > 101 100 98  CO2 '25 22 25   '$ < > '29 28 31  '$ GLUCOSE 205* 166* 179*   < > 144* 169* 161*  BUN 16 19 32*   < > 24* 19 15  CREATININE 0.79 0.81 1.07   < > 0.66 0.61 0.53*  CALCIUM 8.6* 8.0* 7.3*   < > 7.5* 7.6* 7.4*  PROT 7.3 6.1* 5.1*  --   --   --   --   ALBUMIN 3.5 3.0* 2.3*  --   --   --   --   AST 71* 79* 69*  --   --   --   --   ALT 32 34 29  --   --   --   --   ALKPHOS 84 72 54  --   --   --   --   BILITOT 1.2 0.8 1.0  --   --   --   --  GFRNONAA >60 >60 >60   < > >60 >60 >60  ANIONGAP '12 11 8   '$ < > '9 11 10   '$ < > = values in this interval not displayed.     Hematology Recent Labs  Lab 11/28/22 0416 11/29/22 0400 11/30/22 0617  WBC 14.3* 11.4* 7.7  RBC 4.13* 3.92* 4.03*  HGB 11.8* 11.5* 11.5*  HCT 36.2* 33.9* 34.3*  MCV 87.7 86.5 85.1  MCH 28.6 29.3 28.5  MCHC 32.6 33.9 33.5  RDW 15.1 15.2 15.3  PLT 173 159 159    BNP Recent Labs  Lab 11/26/22 0450 11/28/22 0746  BNP 2,649.0* 809.0*     Radiology    No results found.  Cardiac Studies   Echocardiogram 11/26/2022:  1. Global hypokinesis worse in the inferior base . Left ventricular  ejection fraction, by estimation, is 25 to 30%. The left ventricle has  severely decreased function. The left ventricle demonstrates global  hypokinesis. The left ventricular internal  cavity size was moderately dilated. Left ventricular diastolic parameters  are indeterminate.   2. Some septal flattening suggesting elevated PA pressure TR velocity  inadequate to estimate . Right ventricular systolic function is mildly  reduced. The right ventricular size is mildly enlarged.   3. Left atrial size was moderately dilated.   4. The mitral valve is normal in structure. Trivial  mitral valve  regurgitation. No evidence of mitral stenosis.   5. The aortic valve is tricuspid. Aortic valve regurgitation is not  visualized. No aortic stenosis is present.   6. The inferior vena cava is dilated in size with >50% respiratory  variability, suggesting right atrial pressure of 8 mmHg.   Assessment & Plan    1.  HFrEF, newly documented with LVEF 25 to 30% range and mild RV dysfunction.  This is in the setting of influenza A with sepsis and suspected aspiration pneumonia.  Also with episodic atrial tachycardia and NSVT.  Currently on IV Lopressor and IV Lasix, has been treated with digoxin as well.  N.p.o. pending further GI evaluation for esophageal dysphagia and recurring emesis.  2.  Intermittent bursts of atrial tachycardia and NSVT.  Not clearly symptomatic in terms of palpitations.  Reviewed chart including consultation and recommendations by Dr. Johnsie Cancel.  GDMT limited given n.p.o. status and further GI workup.  ReDS clip measurement now 30, will DC IV Lasix for now and follow urine output.  Increase IV Lopressor to 5 mg every 6 hours.  Change digoxin to daily IV dosing until able to safely take pills.  Signed, Rozann Lesches, MD  12/01/2022, 10:38 AM

## 2022-12-01 NOTE — Progress Notes (Signed)
PROGRESS NOTE  Henry [name] YBO:[id number] DOB: Jun 21, 1951 DOA: 11/25/2022 PCP: Curlene Labrum, MD  Brief History:  72 year old male with a history of hearing impairment, esophageal dysphagia, GERD, diabetes mellitus type 2 presenting with fever, coughing, and vomiting for the last 2 to 3 days.  He has had increasing shortness of breath.  Apparently multiple family members have been sick. Reportedly patient lives with his sister. Caregiver is not here in the ER now.   Bedside RN states that family members took the patient's hearing aids home.  Patient has severe hearing loss.  Difficult to communicate.   In the ED, the patient was febrile up to 101.2 F with soft blood pressure down to 90/62.  Oxygen saturation was 85% room air. WBC 7.4, hemoglobin 12.9, platelets 171,000.  BMP showed sodium 130, potassium 3.9, bicarbonate 25, serum creatinine 0.79.  AST 71, ALT 32, alk phosphatase 84, total bilirubin 1.2.  Lactic acid peaked at 4.6.  The patient was started on oseltamavir.  Influenza PCR was positive. The patient became increasingly hypoxemic requiring high flow nasal cannula up to 15 L.  The patient has slowly improved with IV abx.  He was found to have acute HFrEF with EF 25-30% and started on IV lasix. His hospitalization has been prolonged by his slow improvement as well as his intractable vomiting and SVT/atrial tachycardia.   Assessment/Plan:  Severe Sepsis -present on admission -fever, tachycardia, AMS, resp failure -due to bacteremia, pneumonia -PCT 9.33>>57.78 -sepsis physiology resolved   Streptococcus pneumonia bacteremia -source = pneumonia -personally reviewed CXR--bilateral patchy opacities R>L -initially on Unasyn>>changed to ceftriaxone 2 g IV daily   Lobar Pneumonia -there is a component of aspiration pneumonitis -initially on unasyn>>ceftriaxone   Acute respiratory failure with hypoxia -11/26/22 ABG 7.4 2/37/188/24 (15L) -now down to  4L>>3L -Secondary to influenza, pulm edema, pneumonia -has component of aspiration pneumonitis with a history of esophageal dysphagia (Achalasia) and vomiting -Wean oxygen as tolerated -The patient does have signs of volume overload -BNP 2649 -Saline lock IV fluids   Acute HFrEF -12/28 Echo--EF 35-30% -received 4 days IV lasix--last dose 12/01/22 -appreciate cardiology -low BP and vomiting limiting GDMP presently>>initiate gradually as BP improves -12/30 ReDS vest reading--44 -12/01/22 ReDS vest--30   Atrial tachycardia -personally reviewed EKG>>atrial tach -continue digoxin for now -added lopressor IV now that hypotension is improved>>increased to 5 mg IV q6 -if persists, may need amiodarone -optimize electrolytes -still having occasional non-sustained short runs   Esophageal dysphagia/Achalasia/Intractable vomiting -Given the patient's history of this and respiratory failure, concern about aspiration pneumonitis -seen by speech>>dys 3 with thin -start compazine around the clock -GI consult appreciated -planning barium pill esophagram--Severe narrowing of distal esophagus and gastroesophageal junction with bird's beak appearance most consistent with achalasia -reconsult GI  Influenza A -continue oseltamavir -Check PCT 9.33 -now on dys 2 diet   Lactic acidosis -Primarily driven by the patient's hypoxia and sepsis   Diabetes mellitus type 2 with hyperglycemia -Check A1c--8.3 -NovoLog sliding scale -Holding metformin   GERD -Continue pantoprazole   Hypomagnesemia/Hypokalemia -repleted           Family Communication:   sister updated 12/01/22 Consultants:  cardiology   Code Status:  DNR   DVT Prophylaxis:  [place] Heparin     Procedures: As Listed in Progress Note Above   Antibiotics: Unasyn 12/28>>12/29 Ceftriaxone 12/29>> Azithro 12/28>>12/29               Subjective: Pt  continues to have vomiting after eating.  Denies f/c, cp, n/v/d, breathing  better.  No abd pain.  Objective: Vitals:   11/30/22 1936 12/01/22 0500 12/01/22 0527 12/01/22 1407  BP: 132/72  126/78 109/67  Pulse: 80  85 74  Resp: (!) _0 Temp: 99 F (37.2 C)  98.3 F (36.8 C) 97.9 F (36.6 C)  TempSrc:    Oral  SpO2: 96%  95% 95%  Weight:  86.3 kg    Height:        Intake/Output Summary (Last 24 hours) at 12/01/2022 1810 Last data filed at 12/01/2022 1700 Gross per 24 hour  Intake 580 ml  Output 1900 ml  Net -1320 ml   Weight change: 2.3 kg Exam:  General:  Pt is alert, follows commands appropriately, not in acute distress HEENT: No icterus, No thrush, No neck mass, [place]/AT Cardiovascular: RRR, S1/S2, no rubs, no gallops Respiratory: bilateral rales Abdomen: Soft/+BS, non tender, non distended, no guarding Extremities: No edema, No lymphangitis, No petechiae, No rashes, no synovitis   Data Reviewed: I have personally reviewed following labs and imaging studies Basic Metabolic Panel: Recent Labs  Lab 11/27/22 0534 11/28/22 0416 11/29/22 0400 11/30/22 0617 12/01/22 0407  NA 139 137 139 139 139  K 3.7 3.7 3.4* 3.4* 3.2*  CL 106 105 101 100 98  CO2 _1 GLUCOSE 179* 146* 144* 169* 161*  BUN 32* 32* 24* 19 15  CREATININE 1.07 0.70 0.66 0.61 0.53*  CALCIUM 7.3* 7.4* 7.5* 7.6* 7.4*  MG 1.9 2.0 1.9 2.0 1.8  PHOS 2.9  --  3.2  --   --    Liver Function Tests: Recent Labs  Lab 11/25/22 1952 11/26/22 0450 11/27/22 0534  AST 71* 79* 69*  ALT 32 34 29  ALKPHOS 84 72 54  BILITOT 1.2 0.8 1.0  PROT 7.3 6.1* 5.1*  ALBUMIN 3.5 3.0* 2.3*   No results for input(s): "LIPASE", "AMYLASE" in the last 168 hours. No results for input(s): "AMMONIA" in the last 168 hours. Coagulation Profile: Recent Labs  Lab 11/25/22 1952  INR 1.3*   CBC: Recent Labs  Lab 11/25/22 1952 11/26/22 0450 11/27/22 0534 11/28/22 0416 11/29/22 0400 11/30/22 0617  WBC 7.4 12.4* 13.5* 14.3* 11.4* 7.7  NEUTROABS 6.2 9.4*  --   --   --   --   HGB  12.9* 12.5* 11.5* 11.8* 11.5* 11.5*  HCT 40.2 38.9* 35.5* 36.2* 33.9* 34.3*  MCV 88.4 89.4 87.7 87.7 86.5 85.1  PLT 171 165 170 173 159 159   Cardiac Enzymes: No results for input(s): "CKTOTAL", "CKMB", "CKMBINDEX", "TROPONINI" in the last 168 hours. BNP: Invalid input(s): "POCBNP" CBG: Recent Labs  Lab 11/30/22 1714 11/30/22 2116 12/01/22 0753 12/01/22 1151 12/01/22 1637  GLUCAP 181* 206* 170* 194* 184*   HbA1C: No results for input(s): "HGBA1C" in the last 72 hours. Urine analysis:    Component Value Date/Time   COLORURINE AMBER (A) 11/26/2022 0108   APPEARANCEUR HAZY (A) 11/26/2022 0108   LABSPEC 1.025 11/26/2022 0108   PHURINE 5.0 11/26/2022 0108   GLUCOSEU 50 (A) 11/26/2022 0108   HGBUR NEGATIVE 11/26/2022 0108   BILIRUBINUR NEGATIVE 11/26/2022 0108   KETONESUR 5 (A) 11/26/2022 0108   PROTEINUR >=300 (A) 11/26/2022 0108   NITRITE NEGATIVE 11/26/2022 0108   LEUKOCYTESUR NEGATIVE 11/26/2022 0108   Sepsis Labs: _2 (procalcitonin:4,lacticidven:4) ) Recent Results (from the past 240 hour(s))  Culture, blood (Routine x 2)  Status: Abnormal   Collection Time: 11/25/22  7:52 PM   Specimen: BLOOD  Result Value Ref Range Status   Specimen Description   Final    BLOOD LEFT ANTECUBITAL Performed at [facility], [address] [place], [place] [address]    Special Requests   Final    BOTTLES DRAWN AEROBIC AND ANAEROBIC Blood Culture adequate volume Performed at [facility], [address], [place], [place] [address]    Culture  Setup Time   Final    GRAM POSITIVE COCCI BOTTLES DRAWN AEROBIC AND ANAEROBIC Gram Stain Report Called to,Read Back By and Verified With: BASS,V _0  BY MATTHEWS, B 12.28.2023 CRITICAL RESULT CALLED TO, READ BACK BY AND VERIFIED WITHMaralyn Sago [facility] [phone] [id number] JRS Performed at [facility], [place] [address] [place], [place] [address]    Culture STREPTOCOCCUS PNEUMONIAE (A)  Final   Report Status 11/28/2022 FINAL  Final    Organism ID, Bacteria STREPTOCOCCUS PNEUMONIAE  Final      Susceptibility   Streptococcus pneumoniae - MIC*    ERYTHROMYCIN <=0.12 SENSITIVE Sensitive     LEVOFLOXACIN 0.5 SENSITIVE Sensitive     VANCOMYCIN <=0.12 SENSITIVE Sensitive     PENO - penicillin <=0.06      PENICILLIN (non-meningitis) <=0.06 SENSITIVE Sensitive     PENICILLIN (oral) <=0.06 SENSITIVE Sensitive     CEFTRIAXONE (meningitis) <=0.12 SENSITIVE Sensitive     * STREPTOCOCCUS PNEUMONIAE  Blood Culture ID Panel (Reflexed)     Status: Abnormal   Collection Time: 11/25/22  7:52 PM  Result Value Ref Range Status   Enterococcus faecalis NOT DETECTED NOT DETECTED Final   Enterococcus Faecium NOT DETECTED NOT DETECTED Final   Listeria monocytogenes NOT DETECTED NOT DETECTED Final   Staphylococcus species NOT DETECTED NOT DETECTED Final   Staphylococcus aureus (BCID) NOT DETECTED NOT DETECTED Final   Staphylococcus epidermidis NOT DETECTED NOT DETECTED Final   Staphylococcus lugdunensis NOT DETECTED NOT DETECTED Final   Streptococcus species DETECTED (A) NOT DETECTED Final    Comment: PHARMD STEVEN HURSH [id number] JRS   Streptococcus agalactiae NOT DETECTED NOT DETECTED Final   Streptococcus pneumoniae DETECTED (A) NOT DETECTED Final    Comment: PHARMD ST[employer] [id number] JRS   Streptococcus pyogenes NOT DETECTED NOT DETECTED Final   A.calcoaceticus-baumannii NOT DETECTED NOT DETECTED Final   Bacteroides fragilis NOT DETECTED NOT DETECTED Final   Enterobacterales NOT DETECTED NOT DETECTED Final   Enterobacter cloacae complex NOT DETECTED NOT DETECTED Final   Escherichia coli NOT DETECTED NOT DETECTED Final   Klebsiella aerogenes NOT DETECTED NOT DETECTED Final   Klebsiella oxytoca NOT DETECTED NOT DETECTED Final   Klebsiella pneumoniae NOT DETECTED NOT DETECTED Final   Proteus species NOT DETECTED NOT DETECTED Final   Salmonella species NOT DETECTED NOT DETECTED Final   Serratia marcescens NOT DETECTED NOT  DETECTED Final   Haemophilus influenzae NOT DETECTED NOT DETECTED Final   Neisseria meningitidis NOT DETECTED NOT DETECTED Final   Pseudomonas aeruginosa NOT DETECTED NOT DETECTED Final   Stenotrophomonas maltophilia NOT DETECTED NOT DETECTED Final   Candida albicans NOT DETECTED NOT DETECTED Final   Candida auris NOT DETECTED NOT DETECTED Final   Candida glabrata NOT DETECTED NOT DETECTED Final   Candida krusei NOT DETECTED NOT DETECTED Final   Candida parapsilosis NOT DETECTED NOT DETECTED Final   Candida tropicalis NOT DETECTED NOT DETECTED Final   Cryptococcus neoformans/gattii NOT DETECTED NOT DETECTED Final    Comment: Performed at [facility],  [address], [place], [place] [address]  Culture, blood (Routine x 2)     Status: Abnormal   Collection Time: 11/25/22  7:53 PM   Specimen: BLOOD  Result Value Ref Range Status   Specimen Description   Final    BLOOD RIGHT ANTECUBITAL Performed at [facility], [address], [place], [place] [address]    Special Requests   Final    BOTTLES DRAWN AEROBIC AND ANAEROBIC Blood Culture adequate volume Performed at [facility] [place], [place] [address]    Culture  Setup Time   Final    GRAM POSITIVE COCCI BOTTLES DRAWN AEROBIC AND ANAEROBIC Gram Stain Report Called to,Read Back By and Verified With: BASS,V_0  BY MATTHEWS,B 12.28.2023 IN BOTH AEROBIC AND ANAEROBIC BOTTLES    Culture (A)  Final    STREPTOCOCCUS PNEUMONIAE SUSCEPTIBILITIES PERFORMED ON PREVIOUS CULTURE WITHIN THE LAST 5 DAYS. Performed at [facility], [place] [address] [place], [place] [address]    Report Status 11/28/2022 FINAL  Final  Resp panel by RT-PCR (RSV, Flu A&B, Covid) Anterior Nasal Swab     Status: Abnormal   Collection Time: 11/25/22  8:05 PM   Specimen: Anterior Nasal Swab  Result Value Ref Range Status   SARS Coronavirus 2 by RT PCR NEGATIVE NEGATIVE Final    Comment: (NOTE) SARS-CoV-2 target nucleic acids are NOT  DETECTED.  The SARS-CoV-2 RNA is generally detectable in upper respiratory specimens during the acute phase of infection. The lowest concentration of SARS-CoV-2 viral copies this assay can detect is 138 copies/mL. A negative result does not preclude SARS-Cov-2 infection and should not be used as the sole basis for treatment or other patient management decisions. A negative result may occur with  improper specimen collection/handling, submission of specimen other than nasopharyngeal swab, presence of viral mutation(s) within the areas targeted by this assay, and inadequate number of viral copies(<138 copies/mL). A negative result must be combined with clinical observations, patient history, and epidemiological information. The expected result is Negative.  Fact Sheet for Patients:  EntrepreneurPulse.com.au  Fact Sheet for Healthcare Providers:  IncredibleEmployment.be  This test is no t yet approved or cleared by the [place] FDA and  has been authorized for detection and/or diagnosis of SARS-CoV-2 by FDA under an Emergency Use Authorization (EUA). This EUA will remain  in effect (meaning this test can be used) for the duration of the COVID-19 declaration under Section 564(b)(1) of the Act, 21 U.S.C.section 360bbb-3(b)(1), unless the authorization is terminated  or revoked sooner.       Influenza A by PCR POSITIVE (A) NEGATIVE Final   Influenza B by PCR NEGATIVE NEGATIVE Final    Comment: (NOTE) The Xpert Xpress SARS-CoV-2/FLU/RSV plus assay is intended as an aid in the diagnosis of influenza from Nasopharyngeal swab specimens and should not be used as a sole basis for treatment. Nasal washings and aspirates are unacceptable for Xpert Xpress SARS-CoV-2/FLU/RSV testing.  Fact Sheet for Patients: EntrepreneurPulse.com.au  Fact Sheet for Healthcare Providers: IncredibleEmployment.be  This test is not  yet approved or cleared by the [place] FDA and has been authorized for detection and/or diagnosis of SARS-CoV-2 by FDA under an Emergency Use Authorization (EUA). This EUA will remain in effect (meaning this test can be used) for the duration of the COVID-19 declaration under Section 564(b)(1) of the Act, 21 U.S.C. section 360bbb-3(b)(1), unless the authorization is terminated or revoked.     Resp Syncytial Virus by PCR NEGATIVE NEGATIVE Final  Comment: (NOTE) Fact Sheet for Patients: EntrepreneurPulse.com.au  Fact Sheet for Healthcare Providers: IncredibleEmployment.be  This test is not yet approved or cleared by the [place] FDA and has been authorized for detection and/or diagnosis of SARS-CoV-2 by FDA under an Emergency Use Authorization (EUA). This EUA will remain in effect (meaning this test can be used) for the duration of the COVID-19 declaration under Section 564(b)(1) of the Act, 21 U.S.C. section 360bbb-3(b)(1), unless the authorization is terminated or revoked.  Performed at [facility], [address] [place], [place] [address]   MRSA Next Gen by PCR, Nasal     Status: None   Collection Time: 11/27/22 11:56 AM   Specimen: Nasal Mucosa; Nasal Swab  Result Value Ref Range Status   MRSA by PCR Next Gen NOT DETECTED NOT DETECTED Final    Comment: (NOTE) The GeneXpert MRSA Assay (FDA approved for NASAL specimens only), is one component of a comprehensive MRSA colonization surveillance program. It is not intended to diagnose MRSA infection nor to guide or monitor treatment for MRSA infections. Test performance is not FDA approved in patients less than 32 years old. Performed at [facility], [address] [place], [place] [address]      Scheduled Meds:  Chlorhexidine Gluconate Cloth  6 each Topical Daily   digoxin  0.125 mg Intravenous Daily   heparin  5,000 Units Subcutaneous Q8H   insulin aspart  0-5 Units  Subcutaneous QHS   insulin aspart  0-9 Units Subcutaneous TID WC   metoprolol tartrate  5 mg Intravenous Q6H   ondansetron  4 mg Intravenous Once   pantoprazole (PROTONIX) IV  40 mg Intravenous Q24H   prochlorperazine  5 mg Intravenous Q6H   Continuous Infusions:  cefTRIAXone (ROCEPHIN)  IV 2 g (11/30/22 2108)    Procedures/Studies: DG ESOPHAGUS W SINGLE CM (SOL OR THIN BA)  Result Date: 12/01/2022 CLINICAL DATA:  Dysphagia.  Vomiting. EXAM: ESOPHOGRAM/BARIUM SWALLOW TECHNIQUE: Single contrast examination was performed using  thin barium . FLUOROSCOPY: Radiation Exposure Index (as provided by the fluoroscopic device): 20.1 mGy Kerma COMPARISON:  None Available. FINDINGS: There is severe narrowing of the distal esophagus and gastroesophageal junction with bird beak appearance most consistent with achalasia. Mild esophageal dilatation is noted. A small amount of contrast is seen passing through the gastroesophageal junction into the stomach. IMPRESSION: Severe narrowing of distal esophagus and gastroesophageal junction with bird's beak appearance most consistent with achalasia. Endoscopy is recommended for further evaluation. Electronically Signed   By: Marijo Conception M.D.   On: 12/01/2022 15:38   CT CHEST WO CONTRAST  Result Date: 11/28/2022 CLINICAL DATA:  Pneumonia, complication suspected.  Sepsis.  Fever. EXAM: CT CHEST WITHOUT CONTRAST TECHNIQUE: Multidetector CT imaging of the chest was performed following the standard protocol without IV contrast. RADIATION DOSE REDUCTION: This exam was performed according to the departmental dose-optimization program which includes automated exposure control, adjustment of the mA and/or kV according to patient size and/or use of iterative reconstruction technique. COMPARISON:  Chest radiograph November 25, 2022 and esophagram December 11, 2021 FINDINGS: Cardiovascular: Aortic atherosclerosis. Cardiac enlargement. Small pericardial effusion. Coronary artery  calcifications. Mediastinum/Nodes: Prominent mediastinal lymph nodes and hilar nodal tissue not pathologically enlarged by size criteria and favored reactive. Fluid-filled patulous esophagus compatible with findings of achalasia seen on prior esophagram. Lungs/Pleura: Masslike consolidations in the bilateral lower lobes. Additional multifocal airspace opacities in the bilateral upper lobes and right middle lobe. Small bilateral pleural effusions. Upper Abdomen: No acute abnormality. Musculoskeletal: Remote  left lateral rib fractures. Multilevel degenerative changes spine. Degenerative change of the bilateral glenohumeral joints. IMPRESSION: 1. Masslike consolidations in the bilateral lower lobes with additional multifocal airspace opacities in the bilateral upper lobes and right middle lobe, favored to reflect multifocal pneumonia. Recommend follow-up imaging to ensure resolution. 2. Small bilateral pleural effusions. 3. Fluid-filled patulous esophagus compatible with findings of achalasia seen on prior esophagram. 4. Cardiac enlargement with small pericardial effusion, consider further evaluation with echocardiography. 5.  Aortic Atherosclerosis (ICD10-I70.0). Electronically Signed   By: Dahlia Bailiff M.D.   On: 11/28/2022 12:11   ECHOCARDIOGRAM COMPLETE  Result Date: 11/26/2022    ECHOCARDIOGRAM REPORT   Patient Name:   Henry [name] Date of Exam: 11/26/2022 Medical Rec #:  [id number]    Height:       71.0 in Accession #:    [id number]   Weight:       200.6 lb Date of Birth:  Jun 17, 1951    BSA:          2.111 m Patient Age:    66 years     BP:           103/64 mmHg Patient Gender: M            HR:           102 bpm. Exam Location:  [facility] Procedure: 2D Echo, Cardiac Doppler and Color Doppler Indications:    CHF-Acute Diastolic W38.88  History:        Patient has no prior history of Echocardiogram examinations.                 Risk Factors:Diabetes. Acute respiratory failure with hypoxia                 and  Positive for FLU A at present, Mental disability (From Hx).  Sonographer:    Alvino Chapel RCS Referring Phys: [id number] Miliano Cotten  Sonographer Comments: Patient could NOT follow instructions to "sniff". IMPRESSIONS  1. Global hypokinesis worse in the inferior base . Left ventricular ejection fraction, by estimation, is 25 to 30%. The left ventricle has severely decreased function. The left ventricle demonstrates global hypokinesis. The left ventricular internal cavity size was moderately dilated. Left ventricular diastolic parameters are indeterminate.  2. Some septal flattening suggesting elevated PA pressure TR velocity inadequate to estimate . Right ventricular systolic function is mildly reduced. The right ventricular size is mildly enlarged.  3. Left atrial size was moderately dilated.  4. The mitral valve is normal in structure. Trivial mitral valve regurgitation. No evidence of mitral stenosis.  5. The aortic valve is tricuspid. Aortic valve regurgitation is not visualized. No aortic stenosis is present.  6. The inferior vena cava is dilated in size with >50% respiratory variability, suggesting right atrial pressure of 8 mmHg. FINDINGS  Left Ventricle: Global hypokinesis worse in the inferior base. Left ventricular ejection fraction, by estimation, is 25 to 30%. The left ventricle has severely decreased function. The left ventricle demonstrates global hypokinesis. Definity contrast agent was given IV to delineate the left ventricular endocardial borders. The left ventricular internal cavity size was moderately dilated. There is no left ventricular hypertrophy. Left ventricular diastolic parameters are indeterminate. Right Ventricle: Some septal flattening suggesting elevated PA pressure TR velocity inadequate to estimate. The right ventricular size is mildly enlarged. No increase in right ventricular wall thickness. Right ventricular systolic function is mildly reduced. Left Atrium: Left atrial size was  moderately dilated. Right Atrium: Right atrial size  was normal in size. Pericardium: There is no evidence of pericardial effusion. Mitral Valve: The mitral valve is normal in structure. Trivial mitral valve regurgitation. No evidence of mitral valve stenosis. Tricuspid Valve: The tricuspid valve is normal in structure. Tricuspid valve regurgitation is mild . No evidence of tricuspid stenosis. Aortic Valve: The aortic valve is tricuspid. Aortic valve regurgitation is not visualized. No aortic stenosis is present. Pulmonic Valve: The pulmonic valve was normal in structure. Pulmonic valve regurgitation is trivial. No evidence of pulmonic stenosis. Aorta: The aortic root is normal in size and structure. Venous: The inferior vena cava is dilated in size with greater than 50% respiratory variability, suggesting right atrial pressure of 8 mmHg. IAS/Shunts: No atrial level shunt detected by color flow Doppler.  LEFT VENTRICLE PLAX 2D LVIDd:         5.60 cm      Diastology LVIDs:         4.60 cm      LV e' medial:    6.64 cm/s LV PW:         1.00 cm      LV E/e' medial:  12.2 LV IVS:        1.10 cm      LV e' lateral:   8.27 cm/s LVOT diam:     2.00 cm      LV E/e' lateral: 9.8 LV SV:         52 LV SV Index:   25 LVOT Area:     3.14 cm  LV Volumes (MOD) LV vol d, MOD A2C: 116.0 ml LV vol d, MOD A4C: 126.0 ml LV vol s, MOD A2C: 92.4 ml LV vol s, MOD A4C: 81.5 ml LV SV MOD A2C:     23.6 ml LV SV MOD A4C:     126.0 ml LV SV MOD BP:      37.7 ml RIGHT VENTRICLE RV S prime:     12.65 cm/s TAPSE (M-mode): 1.5 cm LEFT ATRIUM             Index        RIGHT ATRIUM           Index LA diam:        4.40 cm 2.08 cm/m   RA Area:     24.70 cm LA Vol (A2C):   70.4 ml 33.34 ml/m  RA Volume:   82.00 ml  38.84 ml/m LA Vol (A4C):   65.2 ml 30.88 ml/m LA Biplane Vol: 74.5 ml 35.29 ml/m  AORTIC VALVE LVOT Vmax:   97.60 cm/s LVOT Vmean:  74.900 cm/s LVOT VTI:    0.166 m  AORTA Ao Root diam: 3.50 cm MITRAL VALVE               TRICUSPID VALVE  MV Area (PHT): 4.94 cm    TR Peak grad:   40.7 mmHg MV Decel Time: 154 msec    TR Vmax:        319.00 cm/s MV E velocity: 80.70 cm/s MV A velocity: 53.30 cm/s  SHUNTS MV E/A ratio:  1.51        Systemic VTI:  0.17 m                            Systemic Diam: 2.00 cm Jenkins Rouge MD Electronically signed by Jenkins Rouge MD Signature Date/Time: 11/26/2022/4:51:55 PM    Final    DG [facility]  Result Date: 11/25/2022 CLINICAL DATA:  Shortness of breath. EXAM: PORTABLE CHEST 1 VIEW COMPARISON:  Chest radiograph dated 04/15/2022. FINDINGS: Bilateral mid to lower lung field confluent nodular densities most consistent with developing infiltrate. Trace left pleural effusion suspected. No pneumothorax. Stable cardiomegaly. No acute osseous pathology. IMPRESSION: Bilateral mid to lower lung field developing infiltrate. Electronically Signed   By: Anner Crete M.D.   On: 11/25/2022 19:19    Orson Eva, DO  [facility] Hospitalists  If 7PM-7AM, please contact night-coverage [email or web address] Password [email or web address] 12/01/2022, 6:10 PM   LOS: 5 days

## 2022-12-01 NOTE — Progress Notes (Signed)
Patient slept throughout this shift, patient cooperative with care, sister at bedside. No complaints of pain or discomfort at this time.

## 2022-12-02 DIAGNOSIS — R1110 Vomiting, unspecified: Secondary | ICD-10-CM

## 2022-12-02 DIAGNOSIS — J9601 Acute respiratory failure with hypoxia: Secondary | ICD-10-CM | POA: Diagnosis not present

## 2022-12-02 DIAGNOSIS — I5020 Unspecified systolic (congestive) heart failure: Secondary | ICD-10-CM | POA: Diagnosis not present

## 2022-12-02 DIAGNOSIS — J101 Influenza due to other identified influenza virus with other respiratory manifestations: Secondary | ICD-10-CM | POA: Diagnosis not present

## 2022-12-02 DIAGNOSIS — I4719 Other supraventricular tachycardia: Secondary | ICD-10-CM | POA: Diagnosis not present

## 2022-12-02 LAB — BASIC METABOLIC PANEL
Anion gap: 12 (ref 5–15)
BUN: 15 mg/dL (ref 8–23)
CO2: 31 mmol/L (ref 22–32)
Calcium: 7.6 mg/dL — ABNORMAL LOW (ref 8.9–10.3)
Chloride: 94 mmol/L — ABNORMAL LOW (ref 98–111)
Creatinine, Ser: 0.53 mg/dL — ABNORMAL LOW (ref 0.61–1.24)
GFR, Estimated: >60 mL/min (ref >60)
Glucose, Bld: 187 mg/dL — ABNORMAL HIGH (ref 70–99)
Potassium: 3.2 mmol/L — ABNORMAL LOW (ref 3.5–5.1)
Sodium: 137 mmol/L (ref 135–145)

## 2022-12-02 LAB — GLUCOSE, CAPILLARY
Glucose-Capillary: 170 mg/dL — ABNORMAL HIGH (ref 70–99)
Glucose-Capillary: 130 mg/dL — ABNORMAL HIGH (ref 70–99)
Glucose-Capillary: 137 mg/dL — ABNORMAL HIGH (ref 70–99)
Glucose-Capillary: 157 mg/dL — ABNORMAL HIGH (ref 70–99)

## 2022-12-02 LAB — MAGNESIUM: Magnesium: 1.9 mg/dL (ref 1.7–2.4)

## 2022-12-02 MED ORDER — ONABOTULINUMTOXINA 100 UNITS IJ SOLR
100.0000 [IU] | INTRAMUSCULAR | Status: AC
  Filled 2022-12-02 (×3): qty 100

## 2022-12-02 MED ORDER — POTASSIUM CHLORIDE 10 MEQ/100ML IV SOLN
10.0000 meq | INTRAVENOUS | Status: AC
  Administered 2022-12-02 (×4): 10 meq via INTRAVENOUS
  Filled 2022-12-02 (×4): qty 100

## 2022-12-02 MED ORDER — HEPARIN SODIUM (PORCINE) 5000 UNIT/ML IJ SOLN
5000.0000 [IU] | Freq: Three times a day (TID) | INTRAMUSCULAR | Status: DC
  Administered 2022-12-03 – 2022-12-05 (×5): 5000 [IU] via SUBCUTANEOUS
  Filled 2022-12-02 (×5): qty 1

## 2022-12-02 NOTE — Progress Notes (Signed)
PROGRESS NOTE  Henry [name] UMP:[id number] DOB: June 21, 1951 DOA: 11/25/2022 PCP: Curlene Labrum, MD  Brief History:  72 year old male with a history of hearing impairment, esophageal dysphagia, GERD, diabetes mellitus type 2 presenting with fever, coughing, and vomiting for the last 2 to 3 days.  He has had increasing shortness of breath.  Apparently multiple family members have been sick. Reportedly patient lives with his sister. Caregiver is not here in the ER now.   Bedside RN states that family members took the patient's hearing aids home.  Patient has severe hearing loss.  Difficult to communicate.   In the ED, the patient was febrile up to 101.2 F with soft blood pressure down to 90/62.  Oxygen saturation was 85% room air. WBC 7.4, hemoglobin 12.9, platelets 171,000.  BMP showed sodium 130, potassium 3.9, bicarbonate 25, serum creatinine 0.79.  AST 71, ALT 32, alk phosphatase 84, total bilirubin 1.2.  Lactic acid peaked at 4.6.  The patient was started on oseltamavir.  Influenza PCR was positive. The patient became increasingly hypoxemic requiring high flow nasal cannula up to 15 L.  The patient has slowly improved with IV abx.  He was found to have acute HFrEF with EF 25-30% and started on IV lasix. His hospitalization has been prolonged by his slow improvement as well as his intractable vomiting and SVT/atrial tachycardia.   Assessment/Plan:  Severe Sepsis -present on admission -fever, tachycardia, AMS, resp failure -due to bacteremia, pneumonia -PCT 9.33>>57.78 -sepsis physiology resolved   Streptococcus pneumonia bacteremia -source = pneumonia -personally reviewed CXR--bilateral patchy opacities R>L -initially on Unasyn>>changed to ceftriaxone 2 g IV daily   Lobar Pneumonia -there is a component of aspiration pneumonitis -initially on unasyn>>ceftriaxone   Acute respiratory failure with hypoxia -11/26/22 ABG 7.4 2/37/188/24 (15L) -now down to  4L>>3L -Secondary to influenza, pulm edema, pneumonia -has component of aspiration pneumonitis with a history of esophageal dysphagia (Achalasia) and vomiting -Wean oxygen as tolerated -The patient does have signs of volume overload -BNP 2649 -Saline lock IV fluids   Acute HFrEF -12/28 Echo--EF 35-30% -received 4 days IV lasix--last dose 12/01/22 -appreciate cardiology -low BP and vomiting limiting GDMP presently>>initiate gradually as BP improves -12/30 ReDS vest reading--44 -12/01/22 ReDS vest--30   Atrial tachycardia -personally reviewed EKG>>atrial tach -continue IV digoxin for now  until swallowing better -added lopressor IV now that hypotension is improved>>increased to 5 mg IV q6 -if persists, may need amiodarone -optimize electrolytes -still having occasional non-sustained short runs, appreciate cardiology team recommendations/assistance   Esophageal dysphagia/Achalasia/Intractable vomiting -Given the patient's history of this and respiratory failure, concern about aspiration pneumonitis -seen by speech>>dys 3 with thin -start compazine around the clock -GI consult appreciated -planning barium pill esophagram--Severe narrowing of distal esophagus and gastroesophageal junction with bird's beak appearance most consistent with achalasia -reconsult GI with plan for EGD/Botox on 12/03/22  Influenza A -continue oseltamavir -Check PCT 9.33 -now on dys 3 diet   Lactic acidosis -Primarily driven by the patient's hypoxia and sepsis   Diabetes mellitus type 2 with hyperglycemia -Check A1c--8.3 -NovoLog sliding scale -Holding metformin   GERD -Continue pantoprazole   Hypomagnesemia/Hypokalemia -repleted      Family Communication:   sister updated 12/02/22 Consultants:  cardiology   Code Status:  DNR   DVT Prophylaxis:  [place] Heparin     Procedures: As Listed in Progress Note Above   Antibiotics: Unasyn 12/28>>12/29 Ceftriaxone 12/29>> Azithro 12/28>>12/29    Subjective: Pt continues  to have vomiting after eating. Planning for EGD/botox on 12/03/22  Objective: Vitals:   12/02/22 0413 12/02/22 0900 12/02/22 0905 12/02/22 1255  BP: 113/61 133/67  (!) 137/59  Pulse: 82 79  78  Resp: (!) _0 Temp: 98.5 F (36.9 C)   98.7 F (37.1 C)  TempSrc: Oral     SpO2: 94% (!) 87% 93%   Weight:      Height:        Intake/Output Summary (Last 24 hours) at 12/02/2022 1643 Last data filed at 12/02/2022 1330 Gross per 24 hour  Intake 865.14 ml  Output --  Net 865.14 ml   Weight change:  Exam:  General:  Pt is alert, follows commands appropriately, not in acute distress HEENT: No icterus, No thrush, No neck mass, /AT Cardiovascular: RRR, S1/S2, no rubs, no gallops Respiratory: bilateral rales Abdomen: Soft/+BS, non tender, non distended, no guarding Extremities: No edema, No lymphangitis, No petechiae, No rashes, no synovitis  Data Reviewed: I have personally reviewed following labs and imaging studies Basic Metabolic Panel: Recent Labs  Lab 11/27/22 0534 11/28/22 0416 11/29/22 0400 11/30/22 0617 12/01/22 0407 12/02/22 0404  NA 139 137 139 139 139 137  K 3.7 3.7 3.4* 3.4* 3.2* 3.2*  CL 106 105 101 100 98 94*  CO2 _1 GLUCOSE 179* 146* 144* 169* 161* 187*  BUN 32* 32* 24* _2 CREATININE 1.07 0.70 0.66 0.61 0.53* 0.53*  CALCIUM 7.3* 7.4* 7.5* 7.6* 7.4* 7.6*  MG 1.9 2.0 1.9 2.0 1.8 1.9  PHOS 2.9  --  3.2  --   --   --    Liver Function Tests: Recent Labs  Lab 11/25/22 1952 11/26/22 0450 11/27/22 0534  AST 71* 79* 69*  ALT 32 34 29  ALKPHOS 84 72 54  BILITOT 1.2 0.8 1.0  PROT 7.3 6.1* 5.1*  ALBUMIN 3.5 3.0* 2.3*   No results for input(s): "LIPASE", "AMYLASE" in the last 168 hours. No results for input(s): "AMMONIA" in the last 168 hours. Coagulation Profile: Recent Labs  Lab 11/25/22 1952  INR 1.3*   CBC: Recent Labs  Lab 11/25/22 1952 11/26/22 0450 11/27/22 0534 11/28/22 0416  11/29/22 0400 11/30/22 0617  WBC 7.4 12.4* 13.5* 14.3* 11.4* 7.7  NEUTROABS 6.2 9.4*  --   --   --   --   HGB 12.9* 12.5* 11.5* 11.8* 11.5* 11.5*  HCT 40.2 38.9* 35.5* 36.2* 33.9* 34.3*  MCV 88.4 89.4 87.7 87.7 86.5 85.1  PLT 171 165 170 173 159 159   Cardiac Enzymes: No results for input(s): "CKTOTAL", "CKMB", "CKMBINDEX", "TROPONINI" in the last 168 hours. BNP: Invalid input(s): "POCBNP" CBG: Recent Labs  Lab 12/01/22 1151 12/01/22 1637 12/01/22 2049 12/02/22 0748 12/02/22 1141  GLUCAP 194* 184* 170* 170* 130*   HbA1C: No results for input(s): "HGBA1C" in the last 72 hours. Urine analysis:    Component Value Date/Time   COLORURINE AMBER (A) 11/26/2022 0108   APPEARANCEUR HAZY (A) 11/26/2022 0108   LABSPEC 1.025 11/26/2022 0108   PHURINE 5.0 11/26/2022 0108   GLUCOSEU 50 (A) 11/26/2022 0108   HGBUR NEGATIVE 11/26/2022 0108   BILIRUBINUR NEGATIVE 11/26/2022 0108   KETONESUR 5 (A) 11/26/2022 0108   PROTEINUR >=300 (A) 11/26/2022 0108   NITRITE NEGATIVE 11/26/2022 0108   LEUKOCYTESUR NEGATIVE 11/26/2022 0108    Recent Results (from the past 240 hour(s))  Culture, blood (Routine x 2)     Status: Abnormal  Collection Time: 11/25/22  7:52 PM   Specimen: BLOOD  Result Value Ref Range Status   Specimen Description   Final    BLOOD LEFT ANTECUBITAL Performed at [facility], [address] [place], [place] [address]    Special Requests   Final    BOTTLES DRAWN AEROBIC AND ANAEROBIC Blood Culture adequate volume Performed at [facility], [address] [place], [place] [address]    Culture  Setup Time   Final    GRAM POSITIVE COCCI BOTTLES DRAWN AEROBIC AND ANAEROBIC Gram Stain Report Called to,Read Back By and Verified With: BASS,V _0  BY MATTHEWS, B 12.28.2023 CRITICAL RESULT CALLED TO, READ BACK BY AND VERIFIED WITHMaralyn Sago [facility] [phone] [id number] JRS Performed at [facility], [place] [address], [place], [place] [address]    Culture STREPTOCOCCUS  PNEUMONIAE (A)  Final   Report Status 11/28/2022 FINAL  Final   Organism ID, Bacteria STREPTOCOCCUS PNEUMONIAE  Final      Susceptibility   Streptococcus pneumoniae - MIC*    ERYTHROMYCIN <=0.12 SENSITIVE Sensitive     LEVOFLOXACIN 0.5 SENSITIVE Sensitive     VANCOMYCIN <=0.12 SENSITIVE Sensitive     PENO - penicillin <=0.06      PENICILLIN (non-meningitis) <=0.06 SENSITIVE Sensitive     PENICILLIN (oral) <=0.06 SENSITIVE Sensitive     CEFTRIAXONE (meningitis) <=0.12 SENSITIVE Sensitive     * STREPTOCOCCUS PNEUMONIAE  Blood Culture ID Panel (Reflexed)     Status: Abnormal   Collection Time: 11/25/22  7:52 PM  Result Value Ref Range Status   Enterococcus faecalis NOT DETECTED NOT DETECTED Final   Enterococcus Faecium NOT DETECTED NOT DETECTED Final   Listeria monocytogenes NOT DETECTED NOT DETECTED Final   Staphylococcus species NOT DETECTED NOT DETECTED Final   Staphylococcus aureus (BCID) NOT DETECTED NOT DETECTED Final   Staphylococcus epidermidis NOT DETECTED NOT DETECTED Final   Staphylococcus lugdunensis NOT DETECTED NOT DETECTED Final   Streptococcus species DETECTED (A) NOT DETECTED Final    Comment: PHARMD STEVEN HURSH [id number] JRS   Streptococcus agalactiae NOT DETECTED NOT DETECTED Final   Streptococcus pneumoniae DETECTED (A) NOT DETECTED Final    Comment: PHARMD STEVEN Mead [id number] JRS   Streptococcus pyogenes NOT DETECTED NOT DETECTED Final   A.calcoaceticus-baumannii NOT DETECTED NOT DETECTED Final   Bacteroides fragilis NOT DETECTED NOT DETECTED Final   Enterobacterales NOT DETECTED NOT DETECTED Final   Enterobacter cloacae complex NOT DETECTED NOT DETECTED Final   Escherichia coli NOT DETECTED NOT DETECTED Final   Klebsiella aerogenes NOT DETECTED NOT DETECTED Final   Klebsiella oxytoca NOT DETECTED NOT DETECTED Final   Klebsiella pneumoniae NOT DETECTED NOT DETECTED Final   Proteus species NOT DETECTED NOT DETECTED Final   Salmonella species NOT DETECTED  NOT DETECTED Final   Serratia marcescens NOT DETECTED NOT DETECTED Final   Haemophilus influenzae NOT DETECTED NOT DETECTED Final   Neisseria meningitidis NOT DETECTED NOT DETECTED Final   Pseudomonas aeruginosa NOT DETECTED NOT DETECTED Final   Stenotrophomonas maltophilia NOT DETECTED NOT DETECTED Final   Candida albicans NOT DETECTED NOT DETECTED Final   Candida auris NOT DETECTED NOT DETECTED Final   Candida glabrata NOT DETECTED NOT DETECTED Final   Candida krusei NOT DETECTED NOT DETECTED Final   Candida parapsilosis NOT DETECTED NOT DETECTED Final   Candida tropicalis NOT DETECTED NOT DETECTED Final   Cryptococcus neoformans/gattii NOT DETECTED NOT DETECTED Final    Comment: Performed at [facility], [address],  [place], [place] [address]  Culture, blood (Routine x 2)     Status: Abnormal   Collection Time: 11/25/22  7:53 PM   Specimen: BLOOD  Result Value Ref Range Status   Specimen Description   Final    BLOOD RIGHT ANTECUBITAL Performed at [facility], [address], [place], [place] [address]    Special Requests   Final    BOTTLES DRAWN AEROBIC AND ANAEROBIC Blood Culture adequate volume Performed at [place] [place], [place] [address]    Culture  Setup Time   Final    GRAM POSITIVE COCCI BOTTLES DRAWN AEROBIC AND ANAEROBIC Gram Stain Report Called to,Read Back By and Verified With: BASS,V_0  BY MATTHEWS,B 12.28.2023 IN BOTH AEROBIC AND ANAEROBIC BOTTLES    Culture (A)  Final    STREPTOCOCCUS PNEUMONIAE SUSCEPTIBILITIES PERFORMED ON PREVIOUS CULTURE WITHIN THE LAST 5 DAYS. Performed at [facility], [place] [address] [place], [place] [address]    Report Status 11/28/2022 FINAL  Final  Resp panel by RT-PCR (RSV, Flu A&B, Covid) Anterior Nasal Swab     Status: Abnormal   Collection Time: 11/25/22  8:05 PM   Specimen: Anterior Nasal Swab  Result Value Ref Range Status   SARS Coronavirus 2 by RT PCR NEGATIVE NEGATIVE Final     Comment: (NOTE) SARS-CoV-2 target nucleic acids are NOT DETECTED.  The SARS-CoV-2 RNA is generally detectable in upper respiratory specimens during the acute phase of infection. The lowest concentration of SARS-CoV-2 viral copies this assay can detect is 138 copies/mL. A negative result does not preclude SARS-Cov-2 infection and should not be used as the sole basis for treatment or other patient management decisions. A negative result may occur with  improper specimen collection/handling, submission of specimen other than nasopharyngeal swab, presence of viral mutation(s) within the areas targeted by this assay, and inadequate number of viral copies(<138 copies/mL). A negative result must be combined with clinical observations, patient history, and epidemiological information. The expected result is Negative.  Fact Sheet for Patients:  EntrepreneurPulse.com.au  Fact Sheet for Healthcare Providers:  IncredibleEmployment.be  This test is no t yet approved or cleared by the [place] FDA and  has been authorized for detection and/or diagnosis of SARS-CoV-2 by FDA under an Emergency Use Authorization (EUA). This EUA will remain  in effect (meaning this test can be used) for the duration of the COVID-19 declaration under Section 564(b)(1) of the Act, 21 U.S.C.section 360bbb-3(b)(1), unless the authorization is terminated  or revoked sooner.       Influenza A by PCR POSITIVE (A) NEGATIVE Final   Influenza B by PCR NEGATIVE NEGATIVE Final    Comment: (NOTE) The Xpert Xpress SARS-CoV-2/FLU/RSV plus assay is intended as an aid in the diagnosis of influenza from Nasopharyngeal swab specimens and should not be used as a sole basis for treatment. Nasal washings and aspirates are unacceptable for Xpert Xpress SARS-CoV-2/FLU/RSV testing.  Fact Sheet for Patients: EntrepreneurPulse.com.au  Fact Sheet for Healthcare  Providers: IncredibleEmployment.be  This test is not yet approved or cleared by the [place] FDA and has been authorized for detection and/or diagnosis of SARS-CoV-2 by FDA under an Emergency Use Authorization (EUA). This EUA will remain in effect (meaning this test can be used) for the duration of the COVID-19 declaration under Section 564(b)(1) of the Act, 21 U.S.C. section 360bbb-3(b)(1), unless the authorization is terminated or revoked.     Resp Syncytial Virus by PCR NEGATIVE NEGATIVE Final    Comment: (NOTE)  Fact Sheet for Patients: EntrepreneurPulse.com.au  Fact Sheet for Healthcare Providers: IncredibleEmployment.be  This test is not yet approved or cleared by the [place] FDA and has been authorized for detection and/or diagnosis of SARS-CoV-2 by FDA under an Emergency Use Authorization (EUA). This EUA will remain in effect (meaning this test can be used) for the duration of the COVID-19 declaration under Section 564(b)(1) of the Act, 21 U.S.C. section 360bbb-3(b)(1), unless the authorization is terminated or revoked.  Performed at [facility], [address] [place], [place] [address]   MRSA Next Gen by PCR, Nasal     Status: None   Collection Time: 11/27/22 11:56 AM   Specimen: Nasal Mucosa; Nasal Swab  Result Value Ref Range Status   MRSA by PCR Next Gen NOT DETECTED NOT DETECTED Final    Comment: (NOTE) The GeneXpert MRSA Assay (FDA approved for NASAL specimens only), is one component of a comprehensive MRSA colonization surveillance program. It is not intended to diagnose MRSA infection nor to guide or monitor treatment for MRSA infections. Test performance is not FDA approved in patients less than 50 years old. Performed at [facility], [address], [place], [place] [address]      Scheduled Meds:  [START ON 12/03/2022] botulinum toxin Type A  100 Units Intramuscular To Endo   Chlorhexidine  Gluconate Cloth  6 each Topical Daily   digoxin  0.125 mg Intravenous Daily   heparin  5,000 Units Subcutaneous Q8H   insulin aspart  0-5 Units Subcutaneous QHS   insulin aspart  0-9 Units Subcutaneous TID WC   metoprolol tartrate  5 mg Intravenous Q6H   ondansetron  4 mg Intravenous Once   pantoprazole (PROTONIX) IV  40 mg Intravenous Q24H   prochlorperazine  5 mg Intravenous Q6H   Continuous Infusions:  cefTRIAXone (ROCEPHIN)  IV 2 g (12/01/22 1858)    Procedures/Studies: DG ESOPHAGUS W SINGLE CM (SOL OR THIN BA)  Result Date: 12/01/2022 CLINICAL DATA:  Dysphagia.  Vomiting. EXAM: ESOPHOGRAM/BARIUM SWALLOW TECHNIQUE: Single contrast examination was performed using  thin barium . FLUOROSCOPY: Radiation Exposure Index (as provided by the fluoroscopic device): 20.1 mGy Kerma COMPARISON:  None Available. FINDINGS: There is severe narrowing of the distal esophagus and gastroesophageal junction with bird beak appearance most consistent with achalasia. Mild esophageal dilatation is noted. A small amount of contrast is seen passing through the gastroesophageal junction into the stomach. IMPRESSION: Severe narrowing of distal esophagus and gastroesophageal junction with bird's beak appearance most consistent with achalasia. Endoscopy is recommended for further evaluation. Electronically Signed   By: Marijo Conception M.D.   On: 12/01/2022 15:38   CT CHEST WO CONTRAST  Result Date: 11/28/2022 CLINICAL DATA:  Pneumonia, complication suspected.  Sepsis.  Fever. EXAM: CT CHEST WITHOUT CONTRAST TECHNIQUE: Multidetector CT imaging of the chest was performed following the standard protocol without IV contrast. RADIATION DOSE REDUCTION: This exam was performed according to the departmental dose-optimization program which includes automated exposure control, adjustment of the mA and/or kV according to patient size and/or use of iterative reconstruction technique. COMPARISON:  Chest radiograph November 25, 2022  and esophagram December 11, 2021 FINDINGS: Cardiovascular: Aortic atherosclerosis. Cardiac enlargement. Small pericardial effusion. Coronary artery calcifications. Mediastinum/Nodes: Prominent mediastinal lymph nodes and hilar nodal tissue not pathologically enlarged by size criteria and favored reactive. Fluid-filled patulous esophagus compatible with findings of achalasia seen on prior esophagram. Lungs/Pleura: Masslike consolidations in the bilateral lower lobes. Additional multifocal airspace opacities in the bilateral upper lobes and right  middle lobe. Small bilateral pleural effusions. Upper Abdomen: No acute abnormality. Musculoskeletal: Remote left lateral rib fractures. Multilevel degenerative changes spine. Degenerative change of the bilateral glenohumeral joints. IMPRESSION: 1. Masslike consolidations in the bilateral lower lobes with additional multifocal airspace opacities in the bilateral upper lobes and right middle lobe, favored to reflect multifocal pneumonia. Recommend follow-up imaging to ensure resolution. 2. Small bilateral pleural effusions. 3. Fluid-filled patulous esophagus compatible with findings of achalasia seen on prior esophagram. 4. Cardiac enlargement with small pericardial effusion, consider further evaluation with echocardiography. 5.  Aortic Atherosclerosis (ICD10-I70.0). Electronically Signed   By: Dahlia Bailiff M.D.   On: 11/28/2022 12:11   ECHOCARDIOGRAM COMPLETE  Result Date: 11/26/2022    ECHOCARDIOGRAM REPORT   Patient Name:   SHYHEIM TANNEY Date of Exam: 11/26/2022 Medical Rec #:  [id number]    Height:       71.0 in Accession #:    [id number]   Weight:       200.6 lb Date of Birth:  07/27/1951    BSA:          2.111 m Patient Age:    20 years     BP:           103/64 mmHg Patient Gender: M            HR:           102 bpm. Exam Location:  [facility] Procedure: 2D Echo, Cardiac Doppler and Color Doppler Indications:    CHF-Acute Diastolic O13.08  History:        Patient has  no prior history of Echocardiogram examinations.                 Risk Factors:Diabetes. Acute respiratory failure with hypoxia                 and Positive for FLU A at present, Mental disability (From Hx).  Sonographer:    Alvino Chapel RCS Referring Phys: [phone] DAVID TAT  Sonographer Comments: Patient could NOT follow instructions to "sniff". IMPRESSIONS  1. Global hypokinesis worse in the inferior base . Left ventricular ejection fraction, by estimation, is 25 to 30%. The left ventricle has severely decreased function. The left ventricle demonstrates global hypokinesis. The left ventricular internal cavity size was moderately dilated. Left ventricular diastolic parameters are indeterminate.  2. Some septal flattening suggesting elevated PA pressure TR velocity inadequate to estimate . Right ventricular systolic function is mildly reduced. The right ventricular size is mildly enlarged.  3. Left atrial size was moderately dilated.  4. The mitral valve is normal in structure. Trivial mitral valve regurgitation. No evidence of mitral stenosis.  5. The aortic valve is tricuspid. Aortic valve regurgitation is not visualized. No aortic stenosis is present.  6. The inferior vena cava is dilated in size with >50% respiratory variability, suggesting right atrial pressure of 8 mmHg. FINDINGS  Left Ventricle: Global hypokinesis worse in the inferior base. Left ventricular ejection fraction, by estimation, is 25 to 30%. The left ventricle has severely decreased function. The left ventricle demonstrates global hypokinesis. Definity contrast agent was given IV to delineate the left ventricular endocardial borders. The left ventricular internal cavity size was moderately dilated. There is no left ventricular hypertrophy. Left ventricular diastolic parameters are indeterminate. Right Ventricle: Some septal flattening suggesting elevated PA pressure TR velocity inadequate to estimate. The right ventricular size is mildly enlarged.  No increase in right ventricular wall thickness. Right ventricular systolic function is mildly reduced.  Left Atrium: Left atrial size was moderately dilated. Right Atrium: Right atrial size was normal in size. Pericardium: There is no evidence of pericardial effusion. Mitral Valve: The mitral valve is normal in structure. Trivial mitral valve regurgitation. No evidence of mitral valve stenosis. Tricuspid Valve: The tricuspid valve is normal in structure. Tricuspid valve regurgitation is mild . No evidence of tricuspid stenosis. Aortic Valve: The aortic valve is tricuspid. Aortic valve regurgitation is not visualized. No aortic stenosis is present. Pulmonic Valve: The pulmonic valve was normal in structure. Pulmonic valve regurgitation is trivial. No evidence of pulmonic stenosis. Aorta: The aortic root is normal in size and structure. Venous: The inferior vena cava is dilated in size with greater than 50% respiratory variability, suggesting right atrial pressure of 8 mmHg. IAS/Shunts: No atrial level shunt detected by color flow Doppler.  LEFT VENTRICLE PLAX 2D LVIDd:         5.60 cm      Diastology LVIDs:         4.60 cm      LV e' medial:    6.64 cm/s LV PW:         1.00 cm      LV E/e' medial:  12.2 LV IVS:        1.10 cm      LV e' lateral:   8.27 cm/s LVOT diam:     2.00 cm      LV E/e' lateral: 9.8 LV SV:         52 LV SV Index:   25 LVOT Area:     3.14 cm  LV Volumes (MOD) LV vol d, MOD A2C: 116.0 ml LV vol d, MOD A4C: 126.0 ml LV vol s, MOD A2C: 92.4 ml LV vol s, MOD A4C: 81.5 ml LV SV MOD A2C:     23.6 ml LV SV MOD A4C:     126.0 ml LV SV MOD BP:      37.7 ml RIGHT VENTRICLE RV S prime:     12.65 cm/s TAPSE (M-mode): 1.5 cm LEFT ATRIUM             Index        RIGHT ATRIUM           Index LA diam:        4.40 cm 2.08 cm/m   RA Area:     24.70 cm LA Vol (A2C):   70.4 ml 33.34 ml/m  RA Volume:   82.00 ml  38.84 ml/m LA Vol (A4C):   65.2 ml 30.88 ml/m LA Biplane Vol: 74.5 ml 35.29 ml/m  AORTIC VALVE  LVOT Vmax:   97.60 cm/s LVOT Vmean:  74.900 cm/s LVOT VTI:    0.166 m  AORTA Ao Root diam: 3.50 cm MITRAL VALVE               TRICUSPID VALVE MV Area (PHT): 4.94 cm    TR Peak grad:   40.7 mmHg MV Decel Time: 154 msec    TR Vmax:        319.00 cm/s MV E velocity: 80.70 cm/s MV A velocity: 53.30 cm/s  SHUNTS MV E/A ratio:  1.51        Systemic VTI:  0.17 m                            Systemic Diam: 2.00 cm Jenkins Rouge MD Electronically signed by Jenkins Rouge MD Signature Date/Time: 11/26/2022/4:51:55  PM    Final    DG Chest Port 1 View  Result Date: 11/25/2022 CLINICAL DATA:  Shortness of breath. EXAM: PORTABLE CHEST 1 VIEW COMPARISON:  Chest radiograph dated 04/15/2022. FINDINGS: Bilateral mid to lower lung field confluent nodular densities most consistent with developing infiltrate. Trace left pleural effusion suspected. No pneumothorax. Stable cardiomegaly. No acute osseous pathology. IMPRESSION: Bilateral mid to lower lung field developing infiltrate. Electronically Signed   By: Anner Crete M.D.   On: 11/25/2022 19:19    Irwin Brakeman, MD  [facility]  If 7PM-7AM, please contact night-coverage [email or web address] Password [facility] 12/02/2022, 4:43 PM   LOS: 6 days

## 2022-12-02 NOTE — Progress Notes (Signed)
Patient did not rest well during night. Sister at bedside. Up multiple to [facility]. 1 bowel movement.

## 2022-12-02 NOTE — Progress Notes (Signed)
SLP Cancellation Note  Patient Details Name: Henry [name] MRN: [id number] DOB: August 13, 1951   Cancelled treatment:       Reason Eval/Treat Not Completed: Other (comment); Chart reviewed following BPE completed yesterday with suspicion of achalasia. Plan is for EGD tomorrow and Botox. Consider D2 (as opposed to D3) due to some impulsivity with intake and reduced mastication (sister also requested chopped foods) given possible achalasia. No further SLP services indicated at this time. SLP will sign off. Reconsult post EGD if indicated.  Thank you,  Genene Churn, [place]    [place] 12/02/2022, 11:57 AM

## 2022-12-02 NOTE — Progress Notes (Addendum)
Subjective: Patient doing well this morning. Has been NPO since midnight. Famiy at bedside tates they were told he was having EGD this morning. Has chronic nausea. He Is hungry. Last BM was last night.   Objective: Vital signs in last 24 hours: Temp:  [97.9 F (36.6 C)-99 F (37.2 C)] 98.5 F (36.9 C) (01/03 0413) Pulse Rate:  [74-87] 82 (01/03 0413) Resp:  [20-24] 24 (01/03 0413) BP: (109-122)/(61-78) 113/61 (01/03 0413) SpO2:  [87 %-95 %] 94 % (01/03 0413) Last BM Date : 11/24/22 General:   Alert pleasant Head:  Normocephalic and atraumatic. Eyes:  No icterus, sclera clear. Conjuctiva pink.  Mouth:  Without lesions, mucosa pink and moist.  Heart:  S1, S2 present, no murmurs noted.  Lungs: +rhonchi  Abdomen:  Bowel sounds present, soft, non-tender, non-distended. No HSM or hernias noted. No rebound or guarding. No masses appreciated  Msk:  Symmetrical without gross deformities. Normal posture. Pulses:  Normal pulses noted. Extremities:  Without clubbing or edema. Neurologic:  Alert Skin:  Warm and dry, intact without significant lesions.  Psych:  Alert and cooperative. Normal mood and affect.  Intake/Output from previous day: 01/02 0701 - 01/03 0700 In: 865.1 [P.O.:480; IV Piggyback:385.1] Out: 900 [Urine:900]  Lab Results: Recent Labs    11/30/22 0617  WBC 7.7  HGB 11.5*  HCT 34.3*  PLT 159   BMET Recent Labs    11/30/22 0617 12/01/22 0407 12/02/22 0404  NA 139 139 137  K 3.4* 3.2* 3.2*  CL 100 98 94*  CO2 '28 31 31  '$ GLUCOSE 169* 161* 187*  BUN '19 15 15  '$ CREATININE 0.61 0.53* 0.53*  CALCIUM 7.6* 7.4* 7.6*    Studies/Results: DG ESOPHAGUS W SINGLE CM (SOL OR THIN BA)  Result Date: 12/01/2022 CLINICAL DATA:  Dysphagia.  Vomiting. EXAM: ESOPHOGRAM/BARIUM SWALLOW TECHNIQUE: Single contrast examination was performed using  thin barium . FLUOROSCOPY: Radiation Exposure Index (as provided by the fluoroscopic device): 20.1 mGy Kerma COMPARISON:  None Available.  FINDINGS: There is severe narrowing of the distal esophagus and gastroesophageal junction with bird beak appearance most consistent with achalasia. Mild esophageal dilatation is noted. A small amount of contrast is seen passing through the gastroesophageal junction into the stomach. IMPRESSION: Severe narrowing of distal esophagus and gastroesophageal junction with bird's beak appearance most consistent with achalasia. Endoscopy is recommended for further evaluation. Electronically Signed   By: Marijo Conception M.D.   On: 12/01/2022 15:38    Assessment: Henry [name] is a 72 year old gentleman with a longstanding esophageal dysphagia admitted to the hospital with recurrent pneumonia(influenza PCR positive + streptococcal pneumonia bacteremia) along with exacerbation of esophageal dysphagia to liquids and solids with associated regurgitation.   Patient initially seen during consult on 12/31.  Suspected to have achalasia as endoscopic findings (EGD 11/2021 and clinical course highly suggestive, although it has not been manometrically proven as he did not tolerate previously attempted esophageal manometry. Barium pill esophagram done yesterday with Severe narrowing of distal esophagus and gastroesophageal junction with bird's beak appearance most consistent with achalasia. High suscpicion for aspiration from back up in his esophagus. Having false teeth exacerbates the situation as he likely swallows larger boluses of solid food on a regular basis.   Unfortunately, he has not a candidate for traditional definitive therapy (surgical myotomy, endoscopic myotomy, pneumatic bag dilation). Recommended that patient be considered for endoscopic botox injections once clinically improved, prior to his discharge. Discussed case with Dr. Abbey Chatters, will plan for EGD with botox injections  tomorrow. Indications, risks and benefits of procedure discussed in detail with patient. Patient verbalized understanding and is in agreement  to proceed with EGD at this time.   Cardiology has been onboard as patient having some intermittent bursts of a tach and NSVT which decreased following increase in BB dosing. Cardiology aware we are planning for upper endoscopy and patient cleared for this from cardiac standpoint.    Plan: Continue PPI once daily DYS 3 diet today, NPO midnight  Plan for EGD with Botox injection with Dr. Abbey Chatters tomorrow    LOS: 6 days    12/02/2022, 9:10 AM  Chelsea L. Alver Sorrow, MSN, APRN, AGNP-C Adult-Gerontology Nurse Practitioner [facility]  Attending note: Agree with above as outlined.  Discussed risks and benefits with Tammy, sister, at the bedside.  Discussed in detail regarding the temporary effects of Botox and the potential need for serial treatments over time.  Questions have been answered.  Both patient and sister agreeable.

## 2022-12-02 NOTE — Progress Notes (Signed)
Pt has been up and out of the bed several times today. Pt is a one assist and does well. Pt has been able to eat throughout the shift and has done well with no complaints of nausea. Pts sister has been bed side this shift.

## 2022-12-02 NOTE — Progress Notes (Signed)
Progress Note  Patient Name: Henry [name] Date of Encounter: 12/02/2022  Primary Cardiologist: Jenkins Rouge, MD  Hospital course reviewed since rounds yesterday.  Inpatient Medications    Scheduled Meds:  Chlorhexidine Gluconate Cloth  6 each Topical Daily   digoxin  0.125 mg Intravenous Daily   heparin  5,000 Units Subcutaneous Q8H   insulin aspart  0-5 Units Subcutaneous QHS   insulin aspart  0-9 Units Subcutaneous TID WC   metoprolol tartrate  5 mg Intravenous Q6H   ondansetron  4 mg Intravenous Once   pantoprazole (PROTONIX) IV  40 mg Intravenous Q24H   prochlorperazine  5 mg Intravenous Q6H   Continuous Infusions:  cefTRIAXone (ROCEPHIN)  IV 2 g (12/01/22 1858)   PRN Meds: acetaminophen **OR** acetaminophen, ipratropium-albuterol, melatonin, ondansetron **OR** ondansetron (ZOFRAN) IV   Vital Signs    Vitals:   12/01/22 2027 12/02/22 0413 12/02/22 0900 12/02/22 0905  BP:  113/61 133/67   Pulse: 87 82 79   Resp:  (!) 24 20   Temp:  98.5 F (36.9 C)    TempSrc:  Oral    SpO2: 92% 94% (!) 87% 93%  Weight:      Height:        Intake/Output Summary (Last 24 hours) at 12/02/2022 0959 Last data filed at 12/02/2022 0327 Gross per 24 hour  Intake 865.14 ml  Output 900 ml  Net -34.86 ml   Filed Weights   11/29/22 0500 11/30/22 0500 12/01/22 0500  Weight: 92 kg 84 kg 86.3 kg    Telemetry    Sinus rhythm with rare PVCs.  Personally reviewed.  Labs    Chemistry Recent Labs  Lab 11/25/22 1952 11/26/22 0450 11/27/22 0534 11/28/22 0416 11/30/22 0617 12/01/22 0407 12/02/22 0404  NA 138 140 139   < > 139 139 137  K 3.9 4.2 3.7   < > 3.4* 3.2* 3.2*  CL 101 107 106   < > 100 98 94*  CO2 '25 22 25   '$ < > '28 31 31  '$ GLUCOSE 205* 166* 179*   < > 169* 161* 187*  BUN 16 19 32*   < > '19 15 15  '$ CREATININE 0.79 0.81 1.07   < > 0.61 0.53* 0.53*  CALCIUM 8.6* 8.0* 7.3*   < > 7.6* 7.4* 7.6*  PROT 7.3 6.1* 5.1*  --   --   --   --   ALBUMIN 3.5 3.0* 2.3*  --   --   --    --   AST 71* 79* 69*  --   --   --   --   ALT 32 34 29  --   --   --   --   ALKPHOS 84 72 54  --   --   --   --   BILITOT 1.2 0.8 1.0  --   --   --   --   GFRNONAA >60 >60 >60   < > >60 >60 >60  ANIONGAP '12 11 8   '$ < > '11 10 12   '$ < > = values in this interval not displayed.    Hematology Recent Labs  Lab 11/28/22 0416 11/29/22 0400 11/30/22 0617  WBC 14.3* 11.4* 7.7  RBC 4.13* 3.92* 4.03*  HGB 11.8* 11.5* 11.5*  HCT 36.2* 33.9* 34.3*  MCV 87.7 86.5 85.1  MCH 28.6 29.3 28.5  MCHC 32.6 33.9 33.5  RDW 15.1 15.2 15.3  PLT 173 159 159   BNP Recent  Labs  Lab 11/26/22 0450 11/28/22 0746  BNP 2,649.0* 809.0*     Radiology    DG ESOPHAGUS W SINGLE CM (SOL OR THIN BA)  Result Date: 12/01/2022 CLINICAL DATA:  Dysphagia.  Vomiting. EXAM: ESOPHOGRAM/BARIUM SWALLOW TECHNIQUE: Single contrast examination was performed using  thin barium . FLUOROSCOPY: Radiation Exposure Index (as provided by the fluoroscopic device): 20.1 mGy Kerma COMPARISON:  None Available. FINDINGS: There is severe narrowing of the distal esophagus and gastroesophageal junction with bird beak appearance most consistent with achalasia. Mild esophageal dilatation is noted. A small amount of contrast is seen passing through the gastroesophageal junction into the stomach. IMPRESSION: Severe narrowing of distal esophagus and gastroesophageal junction with bird's beak appearance most consistent with achalasia. Endoscopy is recommended for further evaluation. Electronically Signed   By: Marijo Conception M.D.   On: 12/01/2022 15:38    Cardiac Studies   Echocardiogram 11/26/2022:  1. Global hypokinesis worse in the inferior base . Left ventricular  ejection fraction, by estimation, is 25 to 30%. The left ventricle has  severely decreased function. The left ventricle demonstrates global  hypokinesis. The left ventricular internal  cavity size was moderately dilated. Left ventricular diastolic parameters  are indeterminate.    2. Some septal flattening suggesting elevated PA pressure TR velocity  inadequate to estimate . Right ventricular systolic function is mildly  reduced. The right ventricular size is mildly enlarged.   3. Left atrial size was moderately dilated.   4. The mitral valve is normal in structure. Trivial mitral valve  regurgitation. No evidence of mitral stenosis.   5. The aortic valve is tricuspid. Aortic valve regurgitation is not  visualized. No aortic stenosis is present.   6. The inferior vena cava is dilated in size with >50% respiratory  variability, suggesting right atrial pressure of 8 mmHg.   Assessment & Plan    1.  HFrEF, newly documented with LVEF 25 to 30% and mild RV dysfunction by echocardiogram on December 28.  This is in the setting of influenza A and sepsis as well as suspected aspiration pneumonia.  GDMT limited with n.p.o. status and severe distal esophageal narrowing with suspected achalasia undergoing further GI workup.  He is currently on IV Lopressor and IV digoxin.  Lasix discontinued yesterday with normalization of ReDS clip measurement at 30.  2.  Intermittent bursts of atrial tachycardia and NSVT, decreased following increase in beta-blocker dosing.  Continue telemetry.  No changes from a cardiac perspective at this time.  Anticipate further GI assessment, potentially endoscopy.  If able to take p.o. medications, we can plan on making further adjustments in terms of GDMT for cardiomyopathy.  Signed, Rozann Lesches, MD  12/02/2022, 9:59 AM

## 2022-12-02 NOTE — Anesthesia Preprocedure Evaluation (Signed)
Anesthesia Evaluation  Patient identified by MRN, date of birth, ID band Patient awake    Reviewed: Allergy & Precautions, NPO status , Patient's Chart, lab work & pertinent test results  Airway Mallampati: II  TM Distance: >3 FB Neck ROM: Full   Comment: Cervical fx Dental  (+) Edentulous Upper, Edentulous Lower   Pulmonary neg pulmonary ROS   Pulmonary exam normal breath sounds clear to auscultation       Cardiovascular +CHF  negative cardio ROS Normal cardiovascular exam Rhythm:Regular Rate:Normal     Neuro/Psych  PSYCHIATRIC DISORDERS Anxiety     Mental disabilitynegative neurological ROS     GI/Hepatic Neg liver ROS,GERD  Medicated,,  Endo/Other  diabetes, Poorly Controlled, Type 2, Insulin Dependent    Renal/GU negative Renal ROS  negative genitourinary   Musculoskeletal negative musculoskeletal ROS (+)    Abdominal   Peds negative pediatric ROS (+)  Hematology negative hematology ROS (+)   Anesthesia Other Findings Colon cancer screening Esophageal dysphagia    Reproductive/Obstetrics negative OB ROS                              Anesthesia Physical Anesthesia Plan  ASA: 3  Anesthesia Plan: General   Post-op Pain Management: Minimal or no pain anticipated   Induction: Intravenous  PONV Risk Score and Plan: TIVA and Propofol infusion  Airway Management Planned: Natural Airway and Nasal Cannula  Additional Equipment:   Intra-op Plan:   Post-operative Plan:   Informed Consent: I have reviewed the patients History and Physical, chart, labs and discussed the procedure including the risks, benefits and alternatives for the proposed anesthesia with the patient or authorized representative who has indicated his/her understanding and acceptance.    Continue DNR and Discussed DNR with power of attorney.   Dental advisory given  Plan Discussed with: CRNA and  Surgeon  Anesthesia Plan Comments: (DNR: Per sister Joanne Chars, may reverse effects of anesthetic meds if required, but continue DNR/DNI order during perioperative period.)         Anesthesia Quick Evaluation

## 2022-12-03 ENCOUNTER — Inpatient Hospital Stay (HOSPITAL_COMMUNITY): Payer: Medicare Other | Admitting: Anesthesiology

## 2022-12-03 ENCOUNTER — Encounter (HOSPITAL_COMMUNITY)
Admission: EM | Disposition: A | Discharge status: Home Health | Payer: Self-pay | Source: Home / Self Care | Attending: Internal Medicine

## 2022-12-03 ENCOUNTER — Encounter (HOSPITAL_COMMUNITY): Payer: Self-pay | Admitting: Internal Medicine

## 2022-12-03 DIAGNOSIS — J101 Influenza due to other identified influenza virus with other respiratory manifestations: Secondary | ICD-10-CM | POA: Diagnosis not present

## 2022-12-03 DIAGNOSIS — K2970 Gastritis, unspecified, without bleeding: Secondary | ICD-10-CM

## 2022-12-03 DIAGNOSIS — J9601 Acute respiratory failure with hypoxia: Secondary | ICD-10-CM | POA: Diagnosis not present

## 2022-12-03 DIAGNOSIS — K220 Achalasia of cardia: Secondary | ICD-10-CM

## 2022-12-03 DIAGNOSIS — I4719 Other supraventricular tachycardia: Secondary | ICD-10-CM | POA: Diagnosis not present

## 2022-12-03 DIAGNOSIS — K449 Diaphragmatic hernia without obstruction or gangrene: Secondary | ICD-10-CM

## 2022-12-03 DIAGNOSIS — R1310 Dysphagia, unspecified: Secondary | ICD-10-CM

## 2022-12-03 DIAGNOSIS — R1110 Vomiting, unspecified: Secondary | ICD-10-CM | POA: Diagnosis not present

## 2022-12-03 DIAGNOSIS — K2289 Other specified disease of esophagus: Secondary | ICD-10-CM

## 2022-12-03 HISTORY — PX: ESOPHAGOGASTRODUODENOSCOPY (EGD) WITH PROPOFOL: SHX5813

## 2022-12-03 HISTORY — PX: BOTOX INJECTION: SHX5754

## 2022-12-03 LAB — BASIC METABOLIC PANEL
Anion gap: 10 (ref 5–15)
BUN: 14 mg/dL (ref 8–23)
CO2: 31 mmol/L (ref 22–32)
Calcium: 7.7 mg/dL — ABNORMAL LOW (ref 8.9–10.3)
Chloride: 96 mmol/L — ABNORMAL LOW (ref 98–111)
Creatinine, Ser: 0.6 mg/dL — ABNORMAL LOW (ref 0.61–1.24)
GFR, Estimated: >60 mL/min (ref >60)
Glucose, Bld: 148 mg/dL — ABNORMAL HIGH (ref 70–99)
Potassium: 3.7 mmol/L (ref 3.5–5.1)
Sodium: 137 mmol/L (ref 135–145)

## 2022-12-03 LAB — GLUCOSE, CAPILLARY
Glucose-Capillary: 175 mg/dL — ABNORMAL HIGH (ref 70–99)
Glucose-Capillary: 120 mg/dL — ABNORMAL HIGH (ref 70–99)
Glucose-Capillary: 130 mg/dL — ABNORMAL HIGH (ref 70–99)
Glucose-Capillary: 183 mg/dL — ABNORMAL HIGH (ref 70–99)

## 2022-12-03 LAB — BRAIN NATRIURETIC PEPTIDE: B Natriuretic Peptide: 845 pg/mL — ABNORMAL HIGH (ref 0.0–100.0)

## 2022-12-03 LAB — MAGNESIUM: Magnesium: 2 mg/dL (ref 1.7–2.4)

## 2022-12-03 SURGERY — ESOPHAGOGASTRODUODENOSCOPY (EGD) WITH PROPOFOL
Anesthesia: General

## 2022-12-03 MED ORDER — SODIUM CHLORIDE 0.9 % IV SOLN: INTRAVENOUS | Status: DC

## 2022-12-03 MED ORDER — SODIUM CHLORIDE (PF) 0.9 % IJ SOLN
INTRAMUSCULAR | Status: DC | PRN
  Administered 2022-12-03: 8 mL via SUBMUCOSAL

## 2022-12-03 MED ORDER — LIDOCAINE HCL (CARDIAC) PF 100 MG/5ML IV SOSY
PREFILLED_SYRINGE | INTRAVENOUS | Status: DC | PRN
  Administered 2022-12-03: 50 mg via INTRAVENOUS

## 2022-12-03 MED ORDER — PROPOFOL 10 MG/ML IV BOLUS
INTRAVENOUS | Status: DC | PRN
  Administered 2022-12-03: 20 mg via INTRAVENOUS
  Administered 2022-12-03: 30 mg via INTRAVENOUS
  Administered 2022-12-03: 70 mg via INTRAVENOUS
  Administered 2022-12-03 (×2): 30 mg via INTRAVENOUS

## 2022-12-03 MED ORDER — LACTATED RINGERS IV SOLN: INTRAVENOUS | Status: DC | PRN

## 2022-12-03 MED ORDER — POTASSIUM CHLORIDE 10 MEQ/100ML IV SOLN
10.0000 meq | INTRAVENOUS | Status: AC
  Administered 2022-12-03 (×3): 10 meq via INTRAVENOUS
  Filled 2022-12-03 (×3): qty 100

## 2022-12-03 NOTE — Interval H&P Note (Signed)
History and Physical Interval Note:  12/03/2022 11:28 AM  Henry [name]  has presented today for surgery, with the diagnosis of achalasia.  The various methods of treatment have been discussed with the patient and family. After consideration of risks, benefits and other options for treatment, the patient has consented to  Procedure(s) with comments: ESOPHAGOGASTRODUODENOSCOPY (EGD) WITH PROPOFOL (N/A) - with botox injections for achalasia BOTOX INJECTION (N/A) - Dr. Abbey Chatters plans to inject 72m in each quadrant for a total of 880m as a surgical intervention.  The patient's history has been reviewed, patient examined, no change in status, stable for surgery.  I have reviewed the patient's chart and labs.  Questions were answered to the patient's satisfaction.     ChEloise Harman

## 2022-12-03 NOTE — Anesthesia Postprocedure Evaluation (Signed)
Anesthesia Post Note  Patient: Henry [name]  Procedure(s) Performed: ESOPHAGOGASTRODUODENOSCOPY (EGD) WITH PROPOFOL BOTOX INJECTION  Patient location during evaluation: Phase II Anesthesia Type: General Level of consciousness: awake and alert Pain management: pain level controlled Vital Signs Assessment: post-procedure vital signs reviewed and stable Respiratory status: spontaneous breathing, nonlabored ventilation, respiratory function stable and patient connected to nasal cannula oxygen Cardiovascular status: blood pressure returned to baseline and stable Postop Assessment: no apparent nausea or vomiting Anesthetic complications: no   There were no known notable events for this encounter.   Last Vitals:  Vitals:   12/03/22 1221 12/03/22 1256  BP: 120/70 134/73  Pulse:    Resp: (!) 35 20  Temp: 37.6 C 37 C  SpO2: (!) 89% 93%    Last Pain:  Vitals:   12/03/22 1256  TempSrc: Oral  PainSc:                  Nicanor Alcon

## 2022-12-03 NOTE — Progress Notes (Signed)
PROGRESS NOTE  Henry [name] PPJ:[id number] DOB: [birth year] DOA: 11/25/2022 PCP: Curlene Labrum, MD  Brief History:  72 year old male with a history of hearing impairment, esophageal dysphagia, GERD, diabetes mellitus type 2 presenting with fever, coughing, and vomiting for the last 2 to 3 days.  He has had increasing shortness of breath.  Apparently multiple family members have been sick. Reportedly patient lives with his sister. Caregiver is not here in the ER now.   Bedside RN states that family members took the patient's hearing aids home.  Patient has severe hearing loss.  Difficult to communicate.   In the ED, the patient was febrile up to 101.2 F with soft blood pressure down to 90/62.  Oxygen saturation was 85% room air. WBC 7.4, hemoglobin 12.9, platelets 171,000.  BMP showed sodium 130, potassium 3.9, bicarbonate 25, serum creatinine 0.79.  AST 71, ALT 32, alk phosphatase 84, total bilirubin 1.2.  Lactic acid peaked at 4.6.  The patient was started on oseltamavir.  Influenza PCR was positive. The patient became increasingly hypoxemic requiring high flow nasal cannula up to 15 L.  The patient has slowly improved with IV abx.  He was found to have acute HFrEF with EF 25-30% and started on IV lasix. His hospitalization has been prolonged by his slow improvement as well as his intractable vomiting and SVT/atrial tachycardia.   Assessment/Plan:  Severe Sepsis - RESOLVED -present on admission -fever, tachycardia, AMS, resp failure -due to bacteremia, pneumonia -PCT 9.33>>57.78 -sepsis physiology resolved   Streptococcus pneumonia bacteremia -source = pneumonia -personally reviewed CXR--bilateral patchy opacities R>L -initially on Unasyn>>changed to ceftriaxone 2 g IV daily   Lobar Pneumonia -there is a component of aspiration pneumonitis -initially on unasyn>>ceftriaxone   Acute respiratory failure with hypoxia -11/26/22 ABG 7.4 2/37/188/24 (15L) -now down to  4L>>3L -Secondary to influenza, pulm edema, pneumonia -has component of aspiration pneumonitis with a history of esophageal dysphagia (Achalasia) and vomiting -Wean oxygen as tolerated -The patient did have signs of volume overload that was treated -BNP 2649>>845   Acute HFrEF -12/28 Echo--EF 35-30% -received 4 days IV lasix--last dose 12/01/22 -appreciate cardiology -low BP and vomiting limiting GDMP presently>>initiate gradually as BP improves -12/30 ReDS vest reading--44 -12/01/22 ReDS vest--30   Atrial tachycardia -personally reviewed EKG>>atrial tach -continue IV digoxin for now  until swallowing better -continue lopressor IV >>increased to 5 mg IV q6 -if persists, may need amiodarone -optimize electrolytes -still having occasional non-sustained short runs, appreciate cardiology team recommendations/assistance   Esophageal dysphagia/Achalasia/Intractable vomiting -Given the patient's history of this and respiratory failure, concern about aspiration pneumonitis -seen by speech>>dys 3 with thin -start compazine around the clock -GI consult appreciated -planning barium pill esophagram--Severe narrowing of distal esophagus and gastroesophageal junction with bird's beak appearance most consistent with achalasia -reconsult GI with plan for EGD/Botox on 12/03/22 -pt had treatment and started on full liquid diet 1/4  Influenza A -continue oseltamavir to complete full course -Check PCT 9.33 -now on full liquid diet   Lactic acidosis -resolved with treatments    Diabetes mellitus type 2 with hyperglycemia -Check A1c--8.3 -NovoLog sliding scale -Holding metformin  CBG (last 3)  Recent Labs    12/02/22 2038 12/03/22 0729 12/03/22 1154  GLUCAP 157* 175* 120*    GERD -Continue pantoprazole   Hypomagnesemia/Hypokalemia -repleted      Family Communication:   sister updated 12/02/22 Consultants:  cardiology   Code Status:  DNR   DVT Prophylaxis:  [place] Heparin      Procedures: As Listed in Progress Note Above   Antibiotics: Unasyn 12/28>>12/29 Ceftriaxone 12/29>> Azithro 12/28>>12/29   Subjective: Pt seen just prior to EGD/botox on 12/03/22  Objective: Vitals:   12/03/22 1147 12/03/22 1150 12/03/22 1221 12/03/22 1256  BP: (!) 143/76  120/70 134/73  Pulse:      Resp:   (!) 35 20  Temp:   99.7 F (37.6 C) 98.6 F (37 C)  TempSrc:    Oral  SpO2:   (!) 89% 93%  Weight:      Height:  _0  (1.803 m)      Intake/Output Summary (Last 24 hours) at 12/03/2022 1343 Last data filed at 12/03/2022 1221 Gross per 24 hour  Intake 500 ml  Output 300 ml  Net 200 ml   Weight change:  Exam:  General:  Pt is alert, follows commands appropriately, not in acute distress HEENT: No icterus, No thrush, No neck mass, [place]/AT Cardiovascular: normal S1/S2, no rubs, no gallops Respiratory: bilateral rales Abdomen: Soft/+BS, non tender, non distended, no guarding Extremities: trace LE edema, No lymphangitis, No petechiae, No rashes, no synovitis  Data Reviewed: I have personally reviewed following labs and imaging studies Basic Metabolic Panel: Recent Labs  Lab 11/27/22 0534 11/28/22 0416 11/29/22 0400 11/30/22 0617 12/01/22 0407 12/02/22 0404 12/03/22 0326  NA 139   < > 139 139 139 137 137  K 3.7   < > 3.4* 3.4* 3.2* 3.2* 3.7  CL 106   < > 101 100 98 94* 96*  CO2 25   < > _1 GLUCOSE 179*   < > 144* 169* 161* 187* 148*  BUN 32*   < > 24* _2 CREATININE 1.07   < > 0.66 0.61 0.53* 0.53* 0.60*  CALCIUM 7.3*   < > 7.5* 7.6* 7.4* 7.6* 7.7*  MG 1.9   < > 1.9 2.0 1.8 1.9 2.0  PHOS 2.9  --  3.2  --   --   --   --    < > = values in this interval not displayed.   Liver Function Tests: Recent Labs  Lab 11/27/22 0534  AST 69*  ALT 29  ALKPHOS 54  BILITOT 1.0  PROT 5.1*  ALBUMIN 2.3*   No results for input(s): "LIPASE", "AMYLASE" in the last 168 hours. No results for input(s): "AMMONIA" in the last 168 hours. Coagulation  Profile: No results for input(s): "INR", "PROTIME" in the last 168 hours.  CBC: Recent Labs  Lab 11/27/22 0534 11/28/22 0416 11/29/22 0400 11/30/22 0617  WBC 13.5* 14.3* 11.4* 7.7  HGB 11.5* 11.8* 11.5* 11.5*  HCT 35.5* 36.2* 33.9* 34.3*  MCV 87.7 87.7 86.5 85.1  PLT 170 173 159 159   Cardiac Enzymes: No results for input(s): "CKTOTAL", "CKMB", "CKMBINDEX", "TROPONINI" in the last 168 hours. BNP: Invalid input(s): "POCBNP" CBG: Recent Labs  Lab 12/02/22 1141 12/02/22 1701 12/02/22 2038 12/03/22 0729 12/03/22 1154  GLUCAP 130* 137* 157* 175* 120*   HbA1C: No results for input(s): "HGBA1C" in the last 72 hours. Urine analysis:    Component Value Date/Time   COLORURINE AMBER (A) 11/26/2022 0108   APPEARANCEUR HAZY (A) 11/26/2022 0108   LABSPEC 1.025 11/26/2022 0108   PHURINE 5.0 11/26/2022 0108   GLUCOSEU 50 (A) 11/26/2022 0108   HGBUR NEGATIVE 11/26/2022 0108   BILIRUBINUR NEGATIVE 11/26/2022 0108   KETONESUR 5 (A) 11/26/2022 0108   PROTEINUR >=300 (A)  11/26/2022 0108   NITRITE NEGATIVE 11/26/2022 0108   LEUKOCYTESUR NEGATIVE 11/26/2022 0108    Recent Results (from the past 240 hour(s))  Culture, blood (Routine x 2)     Status: Abnormal   Collection Time: 11/25/22  7:52 PM   Specimen: BLOOD  Result Value Ref Range Status   Specimen Description   Final    BLOOD LEFT ANTECUBITAL Performed at [facility], [address], [place], [place] [address]    Special Requests   Final    BOTTLES DRAWN AEROBIC AND ANAEROBIC Blood Culture adequate volume Performed at [facility], [address] [place], [place] [address]    Culture  Setup Time   Final    GRAM POSITIVE COCCI BOTTLES DRAWN AEROBIC AND ANAEROBIC Gram Stain Report Called to,Read Back By and Verified With: BASS,V _0  BY MATTHEWS, B 12.28.2023 CRITICAL RESULT CALLED TO, READ BACK BY AND VERIFIED WITHMaralyn Sago [facility] [id number] JRS Performed at [facility], [place] [address], [place],  [place] [address]    Culture STREPTOCOCCUS PNEUMONIAE (A)  Final   Report Status 11/28/2022 FINAL  Final   Organism ID, Bacteria STREPTOCOCCUS PNEUMONIAE  Final      Susceptibility   Streptococcus pneumoniae - MIC*    ERYTHROMYCIN <=0.12 SENSITIVE Sensitive     LEVOFLOXACIN 0.5 SENSITIVE Sensitive     VANCOMYCIN <=0.12 SENSITIVE Sensitive     PENO - penicillin <=0.06      PENICILLIN (non-meningitis) <=0.06 SENSITIVE Sensitive     PENICILLIN (oral) <=0.06 SENSITIVE Sensitive     CEFTRIAXONE (meningitis) <=0.12 SENSITIVE Sensitive     * STREPTOCOCCUS PNEUMONIAE  Blood Culture ID Panel (Reflexed)     Status: Abnormal   Collection Time: 11/25/22  7:52 PM  Result Value Ref Range Status   Enterococcus faecalis NOT DETECTED NOT DETECTED Final   Enterococcus Faecium NOT DETECTED NOT DETECTED Final   Listeria monocytogenes NOT DETECTED NOT DETECTED Final   Staphylococcus species NOT DETECTED NOT DETECTED Final   Staphylococcus aureus (BCID) NOT DETECTED NOT DETECTED Final   Staphylococcus epidermidis NOT DETECTED NOT DETECTED Final   Staphylococcus lugdunensis NOT DETECTED NOT DETECTED Final   Streptococcus species DETECTED (A) NOT DETECTED Final    Comment: PHARMD STEVEN HURSH [id number] JRS   Streptococcus agalactiae NOT DETECTED NOT DETECTED Final   Streptococcus pneumoniae DETECTED (A) NOT DETECTED Final    Comment: PHARMD STEVEN [place]ton [id number] JRS   Streptococcus pyogenes NOT DETECTED NOT DETECTED Final   A.calcoaceticus-baumannii NOT DETECTED NOT DETECTED Final   Bacteroides fragilis NOT DETECTED NOT DETECTED Final   Enterobacterales NOT DETECTED NOT DETECTED Final   Enterobacter cloacae complex NOT DETECTED NOT DETECTED Final   Escherichia coli NOT DETECTED NOT DETECTED Final   Klebsiella aerogenes NOT DETECTED NOT DETECTED Final   Klebsiella oxytoca NOT DETECTED NOT DETECTED Final   Klebsiella pneumoniae NOT DETECTED NOT DETECTED Final   Proteus species NOT DETECTED NOT DETECTED  Final   Salmonella species NOT DETECTED NOT DETECTED Final   Serratia marcescens NOT DETECTED NOT DETECTED Final   Haemophilus influenzae NOT DETECTED NOT DETECTED Final   Neisseria meningitidis NOT DETECTED NOT DETECTED Final   Pseudomonas aeruginosa NOT DETECTED NOT DETECTED Final   Stenotrophomonas maltophilia NOT DETECTED NOT DETECTED Final   Candida albicans NOT DETECTED NOT DETECTED Final   Candida auris NOT DETECTED NOT DETECTED Final   Candida glabrata NOT DETECTED NOT DETECTED Final   Candida krusei NOT DETECTED NOT DETECTED Final   Candida  parapsilosis NOT DETECTED NOT DETECTED Final   Candida tropicalis NOT DETECTED NOT DETECTED Final   Cryptococcus neoformans/gattii NOT DETECTED NOT DETECTED Final    Comment: Performed at [facility], [address], [place], [place] [address]  Culture, blood (Routine x 2)     Status: Abnormal   Collection Time: 11/25/22  7:53 PM   Specimen: BLOOD  Result Value Ref Range Status   Specimen Description   Final    BLOOD RIGHT ANTECUBITAL Performed at [facility], [address] [place], [place] [address]    Special Requests   Final    BOTTLES DRAWN AEROBIC AND ANAEROBIC Blood Culture adequate volume Performed at [place] [place], [place] [address]    Culture  Setup Time   Final    GRAM POSITIVE COCCI BOTTLES DRAWN AEROBIC AND ANAEROBIC Gram Stain Report Called to,Read Back By and Verified With: BASS,V_0  BY MATTHEWS,B 12.28.2023 IN BOTH AEROBIC AND ANAEROBIC BOTTLES    Culture (A)  Final    STREPTOCOCCUS PNEUMONIAE SUSCEPTIBILITIES PERFORMED ON PREVIOUS CULTURE WITHIN THE LAST 5 DAYS. Performed at [facility], [place] [address], [place], [place] [address]    Report Status 11/28/2022 FINAL  Final  Resp panel by RT-PCR (RSV, Flu A&B, Covid) Anterior Nasal Swab     Status: Abnormal   Collection Time: 11/25/22  8:05 PM   Specimen: Anterior Nasal Swab  Result Value Ref Range Status   SARS Coronavirus 2 by  RT PCR NEGATIVE NEGATIVE Final    Comment: (NOTE) SARS-CoV-2 target nucleic acids are NOT DETECTED.  The SARS-CoV-2 RNA is generally detectable in upper respiratory specimens during the acute phase of infection. The lowest concentration of SARS-CoV-2 viral copies this assay can detect is 138 copies/mL. A negative result does not preclude SARS-Cov-2 infection and should not be used as the sole basis for treatment or other patient management decisions. A negative result may occur with  improper specimen collection/handling, submission of specimen other than nasopharyngeal swab, presence of viral mutation(s) within the areas targeted by this assay, and inadequate number of viral copies(<138 copies/mL). A negative result must be combined with clinical observations, patient history, and epidemiological information. The expected result is Negative.  Fact Sheet for Patients:  EntrepreneurPulse.com.au  Fact Sheet for Healthcare Providers:  IncredibleEmployment.be  This test is no t yet approved or cleared by the [place] FDA and  has been authorized for detection and/or diagnosis of SARS-CoV-2 by FDA under an Emergency Use Authorization (EUA). This EUA will remain  in effect (meaning this test can be used) for the duration of the COVID-19 declaration under Section 564(b)(1) of the Act, 21 U.S.C.section 360bbb-3(b)(1), unless the authorization is terminated  or revoked sooner.       Influenza A by PCR POSITIVE (A) NEGATIVE Final   Influenza B by PCR NEGATIVE NEGATIVE Final    Comment: (NOTE) The Xpert Xpress SARS-CoV-2/FLU/RSV plus assay is intended as an aid in the diagnosis of influenza from Nasopharyngeal swab specimens and should not be used as a sole basis for treatment. Nasal washings and aspirates are unacceptable for Xpert Xpress SARS-CoV-2/FLU/RSV testing.  Fact Sheet for Patients: EntrepreneurPulse.com.au  Fact  Sheet for Healthcare Providers: IncredibleEmployment.be  This test is not yet approved or cleared by the [place] FDA and has been authorized for detection and/or diagnosis of SARS-CoV-2 by FDA under an Emergency Use Authorization (EUA). This EUA will remain in effect (meaning this test can be used) for the duration  of the COVID-19 declaration under Section 564(b)(1) of the Act, 21 U.S.C. section 360bbb-3(b)(1), unless the authorization is terminated or revoked.     Resp Syncytial Virus by PCR NEGATIVE NEGATIVE Final    Comment: (NOTE) Fact Sheet for Patients: EntrepreneurPulse.com.au  Fact Sheet for Healthcare Providers: IncredibleEmployment.be  This test is not yet approved or cleared by the [place] FDA and has been authorized for detection and/or diagnosis of SARS-CoV-2 by FDA under an Emergency Use Authorization (EUA). This EUA will remain in effect (meaning this test can be used) for the duration of the COVID-19 declaration under Section 564(b)(1) of the Act, 21 U.S.C. section 360bbb-3(b)(1), unless the authorization is terminated or revoked.  Performed at [facility], [address] [place], [place] [address]   MRSA Next Gen by PCR, Nasal     Status: None   Collection Time: 11/27/22 11:56 AM   Specimen: Nasal Mucosa; Nasal Swab  Result Value Ref Range Status   MRSA by PCR Next Gen NOT DETECTED NOT DETECTED Final    Comment: (NOTE) The GeneXpert MRSA Assay (FDA approved for NASAL specimens only), is one component of a comprehensive MRSA colonization surveillance program. It is not intended to diagnose MRSA infection nor to guide or monitor treatment for MRSA infections. Test performance is not FDA approved in patients less than 27 years old. Performed at [facility], [address], [place], [place] [address]      Scheduled Meds:  botulinum toxin Type A  100 Units Intramuscular To Endo   Chlorhexidine  Gluconate Cloth  6 each Topical Daily   digoxin  0.125 mg Intravenous Daily   heparin  5,000 Units Subcutaneous Q8H   insulin aspart  0-5 Units Subcutaneous QHS   insulin aspart  0-9 Units Subcutaneous TID WC   metoprolol tartrate  5 mg Intravenous Q6H   ondansetron  4 mg Intravenous Once   pantoprazole (PROTONIX) IV  40 mg Intravenous Q24H   prochlorperazine  5 mg Intravenous Q6H   Continuous Infusions:  cefTRIAXone (ROCEPHIN)  IV 2 g (12/02/22 2227)   potassium chloride 10 mEq (12/03/22 1335)    Procedures/Studies: DG ESOPHAGUS W SINGLE CM (SOL OR THIN BA)  Result Date: 12/01/2022 CLINICAL DATA:  Dysphagia.  Vomiting. EXAM: ESOPHOGRAM/BARIUM SWALLOW TECHNIQUE: Single contrast examination was performed using  thin barium . FLUOROSCOPY: Radiation Exposure Index (as provided by the fluoroscopic device): 20.1 mGy Kerma COMPARISON:  None Available. FINDINGS: There is severe narrowing of the distal esophagus and gastroesophageal junction with bird beak appearance most consistent with achalasia. Mild esophageal dilatation is noted. A small amount of contrast is seen passing through the gastroesophageal junction into the stomach. IMPRESSION: Severe narrowing of distal esophagus and gastroesophageal junction with bird's beak appearance most consistent with achalasia. Endoscopy is recommended for further evaluation. Electronically Signed   By: Marijo Conception M.D.   On: 12/01/2022 15:38   CT CHEST WO CONTRAST  Result Date: 11/28/2022 CLINICAL DATA:  Pneumonia, complication suspected.  Sepsis.  Fever. EXAM: CT CHEST WITHOUT CONTRAST TECHNIQUE: Multidetector CT imaging of the chest was performed following the standard protocol without IV contrast. RADIATION DOSE REDUCTION: This exam was performed according to the departmental dose-optimization program which includes automated exposure control, adjustment of the mA and/or kV according to patient size and/or use of iterative reconstruction technique.  COMPARISON:  Chest radiograph November 25, 2022 and esophagram December 11, 2021 FINDINGS: Cardiovascular: Aortic atherosclerosis. Cardiac enlargement. Small pericardial effusion. Coronary artery calcifications. Mediastinum/Nodes: Prominent mediastinal lymph nodes and  hilar nodal tissue not pathologically enlarged by size criteria and favored reactive. Fluid-filled patulous esophagus compatible with findings of achalasia seen on prior esophagram. Lungs/Pleura: Masslike consolidations in the bilateral lower lobes. Additional multifocal airspace opacities in the bilateral upper lobes and right middle lobe. Small bilateral pleural effusions. Upper Abdomen: No acute abnormality. Musculoskeletal: Remote left lateral rib fractures. Multilevel degenerative changes spine. Degenerative change of the bilateral glenohumeral joints. IMPRESSION: 1. Masslike consolidations in the bilateral lower lobes with additional multifocal airspace opacities in the bilateral upper lobes and right middle lobe, favored to reflect multifocal pneumonia. Recommend follow-up imaging to ensure resolution. 2. Small bilateral pleural effusions. 3. Fluid-filled patulous esophagus compatible with findings of achalasia seen on prior esophagram. 4. Cardiac enlargement with small pericardial effusion, consider further evaluation with echocardiography. 5.  Aortic Atherosclerosis (ICD10-I70.0). Electronically Signed   By: Dahlia Bailiff M.D.   On: 11/28/2022 12:11   ECHOCARDIOGRAM COMPLETE  Result Date: 11/26/2022    ECHOCARDIOGRAM REPORT   Patient Name:   ARNE SCHLENDER Date of Exam: 11/26/2022 Medical Rec #:  [id number]    Height:       71.0 in Accession #:    [id number]   Weight:       200.6 lb Date of Birth:  24-Mar-1951    BSA:          2.111 m Patient Age:    43 years     BP:           103/64 mmHg Patient Gender: M            HR:           102 bpm. Exam Location:  [facility] Procedure: 2D Echo, Cardiac Doppler and Color Doppler Indications:     CHF-Acute Diastolic Y60.60  History:        Patient has no prior history of Echocardiogram examinations.                 Risk Factors:Diabetes. Acute respiratory failure with hypoxia                 and Positive for FLU A at present, Mental disability (From Hx).  Sonographer:    Alvino Chapel RCS Referring Phys: [phone] DAVID TAT  Sonographer Comments: Patient could NOT follow instructions to "sniff". IMPRESSIONS  1. Global hypokinesis worse in the inferior base . Left ventricular ejection fraction, by estimation, is 25 to 30%. The left ventricle has severely decreased function. The left ventricle demonstrates global hypokinesis. The left ventricular internal cavity size was moderately dilated. Left ventricular diastolic parameters are indeterminate.  2. Some septal flattening suggesting elevated PA pressure TR velocity inadequate to estimate . Right ventricular systolic function is mildly reduced. The right ventricular size is mildly enlarged.  3. Left atrial size was moderately dilated.  4. The mitral valve is normal in structure. Trivial mitral valve regurgitation. No evidence of mitral stenosis.  5. The aortic valve is tricuspid. Aortic valve regurgitation is not visualized. No aortic stenosis is present.  6. The inferior vena cava is dilated in size with >50% respiratory variability, suggesting right atrial pressure of 8 mmHg. FINDINGS  Left Ventricle: Global hypokinesis worse in the inferior base. Left ventricular ejection fraction, by estimation, is 25 to 30%. The left ventricle has severely decreased function. The left ventricle demonstrates global hypokinesis. Definity contrast agent was given IV to delineate the left ventricular endocardial borders. The left ventricular internal cavity size was moderately dilated. There is no left ventricular  hypertrophy. Left ventricular diastolic parameters are indeterminate. Right Ventricle: Some septal flattening suggesting elevated PA pressure TR velocity inadequate to  estimate. The right ventricular size is mildly enlarged. No increase in right ventricular wall thickness. Right ventricular systolic function is mildly reduced. Left Atrium: Left atrial size was moderately dilated. Right Atrium: Right atrial size was normal in size. Pericardium: There is no evidence of pericardial effusion. Mitral Valve: The mitral valve is normal in structure. Trivial mitral valve regurgitation. No evidence of mitral valve stenosis. Tricuspid Valve: The tricuspid valve is normal in structure. Tricuspid valve regurgitation is mild . No evidence of tricuspid stenosis. Aortic Valve: The aortic valve is tricuspid. Aortic valve regurgitation is not visualized. No aortic stenosis is present. Pulmonic Valve: The pulmonic valve was normal in structure. Pulmonic valve regurgitation is trivial. No evidence of pulmonic stenosis. Aorta: The aortic root is normal in size and structure. Venous: The inferior vena cava is dilated in size with greater than 50% respiratory variability, suggesting right atrial pressure of 8 mmHg. IAS/Shunts: No atrial level shunt detected by color flow Doppler.  LEFT VENTRICLE PLAX 2D LVIDd:         5.60 cm      Diastology LVIDs:         4.60 cm      LV e' medial:    6.64 cm/s LV PW:         1.00 cm      LV E/e' medial:  12.2 LV IVS:        1.10 cm      LV e' lateral:   8.27 cm/s LVOT diam:     2.00 cm      LV E/e' lateral: 9.8 LV SV:         52 LV SV Index:   25 LVOT Area:     3.14 cm  LV Volumes (MOD) LV vol d, MOD A2C: 116.0 ml LV vol d, MOD A4C: 126.0 ml LV vol s, MOD A2C: 92.4 ml LV vol s, MOD A4C: 81.5 ml LV SV MOD A2C:     23.6 ml LV SV MOD A4C:     126.0 ml LV SV MOD BP:      37.7 ml RIGHT VENTRICLE RV S prime:     12.65 cm/s TAPSE (M-mode): 1.5 cm LEFT ATRIUM             Index        RIGHT ATRIUM           Index LA diam:        4.40 cm 2.08 cm/m   RA Area:     24.70 cm LA Vol (A2C):   70.4 ml 33.34 ml/m  RA Volume:   82.00 ml  38.84 ml/m LA Vol (A4C):   65.2 ml 30.88  ml/m LA Biplane Vol: 74.5 ml 35.29 ml/m  AORTIC VALVE LVOT Vmax:   97.60 cm/s LVOT Vmean:  74.900 cm/s LVOT VTI:    0.166 m  AORTA Ao Root diam: 3.50 cm MITRAL VALVE               TRICUSPID VALVE MV Area (PHT): 4.94 cm    TR Peak grad:   40.7 mmHg MV Decel Time: 154 msec    TR Vmax:        319.00 cm/s MV E velocity: 80.70 cm/s MV A velocity: 53.30 cm/s  SHUNTS MV E/A ratio:  1.51        Systemic VTI:  0.17 m  Systemic Diam: 2.00 cm Jenkins Rouge MD Electronically signed by Jenkins Rouge MD Signature Date/Time: 11/26/2022/4:51:55 PM    Final    DG Chest Port 1 View  Result Date: 11/25/2022 CLINICAL DATA:  Shortness of breath. EXAM: PORTABLE CHEST 1 VIEW COMPARISON:  Chest radiograph dated 04/15/2022. FINDINGS: Bilateral mid to lower lung field confluent nodular densities most consistent with developing infiltrate. Trace left pleural effusion suspected. No pneumothorax. Stable cardiomegaly. No acute osseous pathology. IMPRESSION: Bilateral mid to lower lung field developing infiltrate. Electronically Signed   By: Anner Crete M.D.   On: 11/25/2022 19:19    Irwin Brakeman, MD  [facility]  If 7PM-7AM, please contact night-coverage [email or web address] Password [email or web address] 12/03/2022, 1:43 PM   LOS: 7 days

## 2022-12-03 NOTE — Transfer of Care (Signed)
Immediate Anesthesia Transfer of Care Note  Patient: Henry [name]  Procedure(s) Performed: ESOPHAGOGASTRODUODENOSCOPY (EGD) WITH PROPOFOL BOTOX INJECTION  Patient Location: PACU  Anesthesia Type:General  Level of Consciousness: sedated and patient cooperative  Airway & Oxygen Therapy: Patient Spontanous Breathing and Patient connected to nasal cannula oxygen  Post-op Assessment: Report given to RN, Post -op Vital signs reviewed and stable, and Patient moving all extremities  Post vital signs: Reviewed and stable  Last Vitals:  Vitals Value Taken Time  BP    Temp    Pulse    Resp    SpO2      Last Pain:  Vitals:   12/03/22 1158  TempSrc:   PainSc: 0-No pain      Patients Stated Pain Goal: 0 (01/74/94 4967)  Complications: No notable events documented.

## 2022-12-03 NOTE — Op Note (Signed)
[facility] Patient Name: Henry [name] Procedure Date: 12/03/2022 11:26 AM MRN: [id number] Date of Birth: May 15, 1951 Attending MD: Elon Alas. Abbey Chatters , [place], [id number] CSN: [id number] Age: 72 Admit Type: Inpatient Procedure:                Upper GI endoscopy Indications:              Dysphagia, For botulinum toxin injection of                            achalasia, Abnormal BPE Providers:                Elon Alas. Abbey Chatters, DO, Lambert Mody, Aram Candela Referring MD:              Medicines:                See the Anesthesia note for documentation of the                            administered medications Complications:            No immediate complications. Estimated Blood Loss:     Estimated blood loss was minimal. Procedure:                Pre-Anesthesia Assessment:                           - The anesthesia plan was to use monitored                            anesthesia care (MAC).                           After obtaining informed consent, the endoscope was                            passed under direct vision. Throughout the                            procedure, the patient's blood pressure, pulse, and                            oxygen saturations were monitored continuously. The                            [id number]-H190 ([id number]) scope was introduced through the                            mouth, and advanced to the second part of duodenum.                            The upper GI endoscopy was accomplished without                            difficulty. The patient tolerated the procedure  well. Scope In: 37:62:83 PM Scope Out: 12:16:03 PM Total Procedure Duration: 0 hours 9 minutes 22 seconds  Findings:      The lumen of the upper third of the esophagus and middle third of the       esophagus was dilated.      A 3 cm hiatal hernia was present.      Minimal appreciable esophageal motility was noted. In addition, a        hypertonic lower esophageal sphincter was found. There was moderate       resistance to endoscope advancement into the stomach. Area was       successfully injected with 100 units botulinum toxin in 4 quadrants (25U       each).      Diffuse mild inflammation characterized by erythema was found in the       entire examined stomach.      The duodenal bulb, first portion of the duodenum and second portion of       the duodenum were normal. Impression:               - Dilation in the upper third of the esophagus and                            in the middle third of the esophagus.                           - 3 cm hiatal hernia.                           - The esophageal examination was consistent with                            achalasia. Injected with botulinum toxin.                           - Gastritis.                           - Normal duodenal bulb, first portion of the                            duodenum and second portion of the duodenum.                           - No specimens collected. Moderate Sedation:      Per Anesthesia Care Recommendation:           - Return patient to hospital ward for ongoing care.                           - Full liquid diet.                           - Continue present medications.                           - Repeat upper endoscopy PRN for retreatment. Procedure Code(s):        --- Professional ---  78478, Esophagogastroduodenoscopy, flexible,                            transoral; with directed submucosal injection(s),                            any substance Diagnosis Code(s):        --- Professional ---                           K22.89, Other specified disease of esophagus                           K44.9, Diaphragmatic hernia without obstruction or                            gangrene                           K29.70, Gastritis, unspecified, without bleeding                           R13.10, Dysphagia, unspecified                            K22.0, Achalasia of cardia CPT copyright 2022 [employer]. All rights reserved. The codes documented in this report are preliminary and upon coder review may  be revised to meet current compliance requirements. Elon Alas. Abbey Chatters, DO [place] Abbey Chatters, DO 12/03/2022 12:22:28 PM This report has been signed electronically. Number of Addenda: 0

## 2022-12-03 NOTE — Progress Notes (Signed)
   Chart, telemetry reviewed. Last episode of a-tach was early AM hours of 12/02/22. Since then he has maintained NSR with occasional PVCs on IV digoxin and IV metoprolol. HR stable. BP stable. Creatinine stable. Recent digoxin level 1/2 was 0.5. Not yet able to transition to orals -> going for endoscopy today therefore do not anticipate change in regimen today. Would continue IV digoxin and IV metoprolol at present doses. BNP remains 800 range but remains improved from 12/28, weight stable. Cardiology will follow again in AM. Will defer lyte management to primary team - recommend keeping K 4.0 or greater (3.7 today) and Mg 2.0 or greater (2.0).   Charlie Pitter, PA-C

## 2022-12-04 DIAGNOSIS — R1110 Vomiting, unspecified: Secondary | ICD-10-CM | POA: Diagnosis not present

## 2022-12-04 DIAGNOSIS — I4719 Other supraventricular tachycardia: Secondary | ICD-10-CM | POA: Diagnosis not present

## 2022-12-04 DIAGNOSIS — J101 Influenza due to other identified influenza virus with other respiratory manifestations: Secondary | ICD-10-CM | POA: Diagnosis not present

## 2022-12-04 DIAGNOSIS — K219 Gastro-esophageal reflux disease without esophagitis: Secondary | ICD-10-CM | POA: Diagnosis not present

## 2022-12-04 DIAGNOSIS — I5020 Unspecified systolic (congestive) heart failure: Secondary | ICD-10-CM | POA: Diagnosis not present

## 2022-12-04 LAB — BASIC METABOLIC PANEL
Anion gap: 10 (ref 5–15)
BUN: 13 mg/dL (ref 8–23)
CO2: 29 mmol/L (ref 22–32)
Calcium: 7.6 mg/dL — ABNORMAL LOW (ref 8.9–10.3)
Chloride: 98 mmol/L (ref 98–111)
Creatinine, Ser: 0.58 mg/dL — ABNORMAL LOW (ref 0.61–1.24)
GFR, Estimated: >60 mL/min (ref >60)
Glucose, Bld: 150 mg/dL — ABNORMAL HIGH (ref 70–99)
Potassium: 3.5 mmol/L (ref 3.5–5.1)
Sodium: 137 mmol/L (ref 135–145)

## 2022-12-04 LAB — GLUCOSE, CAPILLARY
Glucose-Capillary: 135 mg/dL — ABNORMAL HIGH (ref 70–99)
Glucose-Capillary: 202 mg/dL — ABNORMAL HIGH (ref 70–99)
Glucose-Capillary: 201 mg/dL — ABNORMAL HIGH (ref 70–99)
Glucose-Capillary: 156 mg/dL — ABNORMAL HIGH (ref 70–99)

## 2022-12-04 LAB — MAGNESIUM: Magnesium: 2 mg/dL (ref 1.7–2.4)

## 2022-12-04 LAB — TSH: TSH: 0.473 u[IU]/mL (ref 0.350–4.500)

## 2022-12-04 MED ORDER — ORAL CARE MOUTH RINSE: 15.0000 mL | OROMUCOSAL | Status: DC | PRN

## 2022-12-04 NOTE — Progress Notes (Signed)
Patient was able to drink sips of water and eat an [place] ice and tolerated it well. Patient rested well during the 7P-7A shift. Sister is at bedside.

## 2022-12-04 NOTE — Progress Notes (Signed)
PROGRESS NOTE  Henry [name]:[id number] DOB: 04-19-1951 DOA: 11/25/2022 PCP: Curlene Labrum, MD  Brief History:  72 year old male with a history of hearing impairment, esophageal dysphagia, GERD, diabetes mellitus type 2 presenting with fever, coughing, and vomiting for the last 2 to 3 days.  He has had increasing shortness of breath.  Apparently multiple family members have been sick. Reportedly patient lives with his sister. Caregiver is not here in the ER now.   Bedside RN states that family members took the patient's hearing aids home.  Patient has severe hearing loss.  Difficult to communicate.   In the ED, the patient was febrile up to 101.2 F with soft blood pressure down to 90/62.  Oxygen saturation was 85% room air. WBC 7.4, hemoglobin 12.9, platelets 171,000.  BMP showed sodium 130, potassium 3.9, bicarbonate 25, serum creatinine 0.79.  AST 71, ALT 32, alk phosphatase 84, total bilirubin 1.2.  Lactic acid peaked at 4.6.  The patient was started on oseltamavir.  Influenza PCR was positive. The patient became increasingly hypoxemic requiring high flow nasal cannula up to 15 L.  The patient has slowly improved with IV abx.  He was found to have acute HFrEF with EF 25-30% and started on IV lasix. His hospitalization has been prolonged by his slow improvement as well as his intractable vomiting and SVT/atrial tachycardia.   Assessment/Plan:  Severe Sepsis - RESOLVED -present on admission -fever, tachycardia, AMS, resp failure -due to bacteremia, pneumonia -PCT 9.33>>57.78 -sepsis physiology resolved   Streptococcus pneumonia bacteremia -source = pneumonia -personally reviewed CXR--bilateral patchy opacities R>L -initially on Unasyn>>changed to ceftriaxone 2 g IV daily, complete doses 12/04/22   Lobar Pneumonia -there is a component of aspiration pneumonitis -initially on unasyn>>ceftriaxone   Acute respiratory failure with hypoxia -11/26/22 ABG 7.4 2/37/188/24  (15L) -now down to 4L>>3L -Secondary to influenza, pulm edema, pneumonia -has component of aspiration pneumonitis with a history of esophageal dysphagia (Achalasia) and vomiting -Wean oxygen as tolerated -The patient did have signs of volume overload that was treated -BNP 2649>>845   Acute HFrEF -12/28 Echo--EF 35-30% -received 4 days IV lasix--last dose 12/01/22 -appreciate cardiology -low BP and vomiting limiting GDMP presently>>initiate gradually as BP improves -12/30 ReDS vest reading--44 -12/01/22 ReDS vest--30   Atrial tachycardia -personally reviewed EKG>>atrial tach -continue IV digoxin for now  until swallowing better -continue lopressor IV >>increased to 5 mg IV q6 -if persists, may need amiodarone -optimize electrolytes -still having occasional non-sustained short runs, appreciate cardiology team recommendations/assistance -likely to start on toprol XL 50 mg daily starting 12/05/22   Esophageal dysphagia/Achalasia/Intractable vomiting -Given the patient's history of this and respiratory failure, concern about aspiration pneumonitis -seen by speech>>dys 3 with thin -start compazine around the clock -GI consult appreciated -planning barium pill esophagram--Severe narrowing of distal esophagus and gastroesophageal junction with bird's beak appearance most consistent with achalasia -reconsult GI with plan for EGD/Botox on 12/03/22 -pt had treatment and started on full liquid diet 1/4  Influenza A -continue oseltamavir to complete full course -Check PCT 9.33 -now on full liquid diet   Lactic acidosis -resolved with treatments    Diabetes mellitus type 2 with hyperglycemia -Check A1c--8.3 -NovoLog sliding scale -Holding metformin  CBG (last 3)  Recent Labs    12/04/22 0730 12/04/22 1115 12/04/22 1633  GLUCAP 135* 202* 201*    GERD -Continue pantoprazole   Hypomagnesemia/Hypokalemia -repleted      Family Communication:   sister updated  12/02/22 Consultants:   cardiology   Code Status:  DNR   DVT Prophylaxis:  Miller Heparin     Procedures: As Listed in Progress Note Above   Antibiotics: Unasyn 12/28>>12/29 Ceftriaxone 12/29>> Azithro 12/28>>12/29   Subjective: Pt able to tolerate liquids so far.    Objective: Vitals:   12/04/22 0401 12/04/22 0900 12/04/22 1351 12/04/22 1400  BP: 119/70  121/69   Pulse: 80  83   Resp: 20  (!) 36 20  Temp: 98 F (36.7 C)  97.7 F (36.5 C)   TempSrc:   Oral   SpO2: 95% 93% 96%   Weight: 86 kg     Height:        Intake/Output Summary (Last 24 hours) at 12/04/2022 1758 Last data filed at 12/04/2022 1733 Gross per 24 hour  Intake 340 ml  Output 550 ml  Net -210 ml   Weight change: -0.5 kg Exam:  General:  Pt is alert, follows commands appropriately, not in acute distress HEENT: No icterus, No thrush, No neck mass, [place]/AT Cardiovascular: normal S1/S2, no rubs, no gallops Respiratory: bilateral rales Abdomen: Soft/+BS, non tender, non distended, no guarding Extremities: trace LE edema, No lymphangitis, No petechiae, No rashes, no synovitis  Data Reviewed: I have personally reviewed following labs and imaging studies Basic Metabolic Panel: Recent Labs  Lab 11/29/22 0400 11/30/22 0617 12/01/22 0407 12/02/22 0404 12/03/22 0326 12/04/22 0305  NA 139 139 139 137 137 137  K 3.4* 3.4* 3.2* 3.2* 3.7 3.5  CL 101 100 98 94* 96* 98  CO2 '29 28 31 31 31 29  '$ GLUCOSE 144* 169* 161* 187* 148* 150*  BUN 24* '19 15 15 14 13  '$ CREATININE 0.66 0.61 0.53* 0.53* 0.60* 0.58*  CALCIUM 7.5* 7.6* 7.4* 7.6* 7.7* 7.6*  MG 1.9 2.0 1.8 1.9 2.0 2.0  PHOS 3.2  --   --   --   --   --    Liver Function Tests: No results for input(s): "AST", "ALT", "ALKPHOS", "BILITOT", "PROT", "ALBUMIN" in the last 168 hours.  No results for input(s): "LIPASE", "AMYLASE" in the last 168 hours. No results for input(s): "AMMONIA" in the last 168 hours. Coagulation Profile: No results for input(s): "INR", "PROTIME" in the last 168  hours.  CBC: Recent Labs  Lab 11/28/22 0416 11/29/22 0400 11/30/22 0617  WBC 14.3* 11.4* 7.7  HGB 11.8* 11.5* 11.5*  HCT 36.2* 33.9* 34.3*  MCV 87.7 86.5 85.1  PLT 173 159 159   Cardiac Enzymes: No results for input(s): "CKTOTAL", "CKMB", "CKMBINDEX", "TROPONINI" in the last 168 hours. BNP: Invalid input(s): "POCBNP" CBG: Recent Labs  Lab 12/03/22 1627 12/03/22 2121 12/04/22 0730 12/04/22 1115 12/04/22 1633  GLUCAP 130* 183* 135* 202* 201*   HbA1C: No results for input(s): "HGBA1C" in the last 72 hours. Urine analysis:    Component Value Date/Time   COLORURINE AMBER (A) 11/26/2022 0108   APPEARANCEUR HAZY (A) 11/26/2022 0108   LABSPEC 1.025 11/26/2022 0108   PHURINE 5.0 11/26/2022 0108   GLUCOSEU 50 (A) 11/26/2022 0108   HGBUR NEGATIVE 11/26/2022 0108   BILIRUBINUR NEGATIVE 11/26/2022 0108   KETONESUR 5 (A) 11/26/2022 0108   PROTEINUR >=300 (A) 11/26/2022 0108   NITRITE NEGATIVE 11/26/2022 0108   LEUKOCYTESUR NEGATIVE 11/26/2022 0108    Recent Results (from the past 240 hour(s))  Culture, blood (Routine x 2)     Status: Abnormal   Collection Time: 11/25/22  7:52 PM   Specimen: BLOOD  Result Value Ref Range Status  Specimen Description   Final    BLOOD LEFT ANTECUBITAL Performed at [facility], [address], [place], [place] [address]    Special Requests   Final    BOTTLES DRAWN AEROBIC AND ANAEROBIC Blood Culture adequate volume Performed at [facility], [address], [place], [place] [address]    Culture  Setup Time   Final    GRAM POSITIVE COCCI BOTTLES DRAWN AEROBIC AND ANAEROBIC Gram Stain Report Called to,Read Back By and Verified With: BASS,V '@1000'$  BY MATTHEWS, B 12.28.2023 CRITICAL RESULT CALLED TO, READ BACK BY AND VERIFIED WITHMaralyn Sago [facility] [id number] JRS Performed at [facility], [place] [address], [place], [place] [address]    Culture STREPTOCOCCUS PNEUMONIAE (A)  Final   Report Status 11/28/2022 FINAL  Final   Organism  ID, Bacteria STREPTOCOCCUS PNEUMONIAE  Final      Susceptibility   Streptococcus pneumoniae - MIC*    ERYTHROMYCIN <=0.12 SENSITIVE Sensitive     LEVOFLOXACIN 0.5 SENSITIVE Sensitive     VANCOMYCIN <=0.12 SENSITIVE Sensitive     PENO - penicillin <=0.06      PENICILLIN (non-meningitis) <=0.06 SENSITIVE Sensitive     PENICILLIN (oral) <=0.06 SENSITIVE Sensitive     CEFTRIAXONE (meningitis) <=0.12 SENSITIVE Sensitive     * STREPTOCOCCUS PNEUMONIAE  Blood Culture ID Panel (Reflexed)     Status: Abnormal   Collection Time: 11/25/22  7:52 PM  Result Value Ref Range Status   Enterococcus faecalis NOT DETECTED NOT DETECTED Final   Enterococcus Faecium NOT DETECTED NOT DETECTED Final   Listeria monocytogenes NOT DETECTED NOT DETECTED Final   Staphylococcus species NOT DETECTED NOT DETECTED Final   Staphylococcus aureus (BCID) NOT DETECTED NOT DETECTED Final   Staphylococcus epidermidis NOT DETECTED NOT DETECTED Final   Staphylococcus lugdunensis NOT DETECTED NOT DETECTED Final   Streptococcus species DETECTED (A) NOT DETECTED Final    Comment: PHARMD STEVEN HURSH [id number] JRS   Streptococcus agalactiae NOT DETECTED NOT DETECTED Final   Streptococcus pneumoniae DETECTED (A) NOT DETECTED Final    Comment: PHARMD STEVEN [place] [id number] JRS   Streptococcus pyogenes NOT DETECTED NOT DETECTED Final   A.calcoaceticus-baumannii NOT DETECTED NOT DETECTED Final   Bacteroides fragilis NOT DETECTED NOT DETECTED Final   Enterobacterales NOT DETECTED NOT DETECTED Final   Enterobacter cloacae complex NOT DETECTED NOT DETECTED Final   Escherichia coli NOT DETECTED NOT DETECTED Final   Klebsiella aerogenes NOT DETECTED NOT DETECTED Final   Klebsiella oxytoca NOT DETECTED NOT DETECTED Final   Klebsiella pneumoniae NOT DETECTED NOT DETECTED Final   Proteus species NOT DETECTED NOT DETECTED Final   Salmonella species NOT DETECTED NOT DETECTED Final   Serratia marcescens NOT DETECTED NOT DETECTED Final    Haemophilus influenzae NOT DETECTED NOT DETECTED Final   Neisseria meningitidis NOT DETECTED NOT DETECTED Final   Pseudomonas aeruginosa NOT DETECTED NOT DETECTED Final   Stenotrophomonas maltophilia NOT DETECTED NOT DETECTED Final   Candida albicans NOT DETECTED NOT DETECTED Final   Candida auris NOT DETECTED NOT DETECTED Final   Candida glabrata NOT DETECTED NOT DETECTED Final   Candida krusei NOT DETECTED NOT DETECTED Final   Candida parapsilosis NOT DETECTED NOT DETECTED Final   Candida tropicalis NOT DETECTED NOT DETECTED Final   Cryptococcus neoformans/gattii NOT DETECTED NOT DETECTED Final    Comment: Performed at [facility], [address], [place], [place] [address]  Culture, blood (Routine x 2)     Status: Abnormal   Collection  Time: 11/25/22  7:53 PM   Specimen: BLOOD  Result Value Ref Range Status   Specimen Description   Final    BLOOD RIGHT ANTECUBITAL Performed at [facility], [address] [place], [place] [address]    Special Requests   Final    BOTTLES DRAWN AEROBIC AND ANAEROBIC Blood Culture adequate volume Performed at [place] [place], [place] [address]    Culture  Setup Time   Final    GRAM POSITIVE COCCI BOTTLES DRAWN AEROBIC AND ANAEROBIC Gram Stain Report Called to,Read Back By and Verified With: BASS,V'@1000'$  BY MATTHEWS,B 12.28.2023 IN BOTH AEROBIC AND ANAEROBIC BOTTLES    Culture (A)  Final    STREPTOCOCCUS PNEUMONIAE SUSCEPTIBILITIES PERFORMED ON PREVIOUS CULTURE WITHIN THE LAST 5 DAYS. Performed at [facility], [place] [address] [place], [place] [address]    Report Status 11/28/2022 FINAL  Final  Resp panel by RT-PCR (RSV, Flu A&B, Covid) Anterior Nasal Swab     Status: Abnormal   Collection Time: 11/25/22  8:05 PM   Specimen: Anterior Nasal Swab  Result Value Ref Range Status   SARS Coronavirus 2 by RT PCR NEGATIVE NEGATIVE Final    Comment: (NOTE) SARS-CoV-2 target nucleic acids are NOT DETECTED.  The  SARS-CoV-2 RNA is generally detectable in upper respiratory specimens during the acute phase of infection. The lowest concentration of SARS-CoV-2 viral copies this assay can detect is 138 copies/mL. A negative result does not preclude SARS-Cov-2 infection and should not be used as the sole basis for treatment or other patient management decisions. A negative result may occur with  improper specimen collection/handling, submission of specimen other than nasopharyngeal swab, presence of viral mutation(s) within the areas targeted by this assay, and inadequate number of viral copies(<138 copies/mL). A negative result must be combined with clinical observations, patient history, and epidemiological information. The expected result is Negative.  Fact Sheet for Patients:  EntrepreneurPulse.com.au  Fact Sheet for Healthcare Providers:  IncredibleEmployment.be  This test is no t yet approved or cleared by the [place] FDA and  has been authorized for detection and/or diagnosis of SARS-CoV-2 by FDA under an Emergency Use Authorization (EUA). This EUA will remain  in effect (meaning this test can be used) for the duration of the COVID-19 declaration under Section 564(b)(1) of the Act, 21 U.S.C.section 360bbb-3(b)(1), unless the authorization is terminated  or revoked sooner.       Influenza A by PCR POSITIVE (A) NEGATIVE Final   Influenza B by PCR NEGATIVE NEGATIVE Final    Comment: (NOTE) The Xpert Xpress SARS-CoV-2/FLU/RSV plus assay is intended as an aid in the diagnosis of influenza from Nasopharyngeal swab specimens and should not be used as a sole basis for treatment. Nasal washings and aspirates are unacceptable for Xpert Xpress SARS-CoV-2/FLU/RSV testing.  Fact Sheet for Patients: EntrepreneurPulse.com.au  Fact Sheet for Healthcare Providers: IncredibleEmployment.be  This test is not yet approved or  cleared by the [place] FDA and has been authorized for detection and/or diagnosis of SARS-CoV-2 by FDA under an Emergency Use Authorization (EUA). This EUA will remain in effect (meaning this test can be used) for the duration of the COVID-19 declaration under Section 564(b)(1) of the Act, 21 U.S.C. section 360bbb-3(b)(1), unless the authorization is terminated or revoked.     Resp Syncytial Virus by PCR NEGATIVE NEGATIVE Final    Comment: (NOTE) Fact Sheet for Patients: EntrepreneurPulse.com.au  Fact Sheet for Healthcare Providers: IncredibleEmployment.be  This test is not yet  approved or cleared by the [place] and has been authorized for detection and/or diagnosis of SARS-CoV-2 by FDA under an Emergency Use Authorization (EUA). This EUA will remain in effect (meaning this test can be used) for the duration of the COVID-19 declaration under Section 564(b)(1) of the Act, 21 U.S.C. section 360bbb-3(b)(1), unless the authorization is terminated or revoked.  Performed at [facility], [address], [place], [place] [address]   MRSA Next Gen by PCR, Nasal     Status: None   Collection Time: 11/27/22 11:56 AM   Specimen: Nasal Mucosa; Nasal Swab  Result Value Ref Range Status   MRSA by PCR Next Gen NOT DETECTED NOT DETECTED Final    Comment: (NOTE) The GeneXpert MRSA Assay (FDA approved for NASAL specimens only), is one component of a comprehensive MRSA colonization surveillance program. It is not intended to diagnose MRSA infection nor to guide or monitor treatment for MRSA infections. Test performance is not FDA approved in patients less than 55 years old. Performed at [facility], [address], [place], [place] [address]      Scheduled Meds:  heparin  5,000 Units Subcutaneous Q8H   insulin aspart  0-5 Units Subcutaneous QHS   insulin aspart  0-9 Units Subcutaneous TID WC   metoprolol tartrate  5 mg Intravenous Q6H    ondansetron  4 mg Intravenous Once   pantoprazole (PROTONIX) IV  40 mg Intravenous Q24H   prochlorperazine  5 mg Intravenous Q6H   Continuous Infusions:  cefTRIAXone (ROCEPHIN)  IV 2 g (12/03/22 2006)    Procedures/Studies: DG ESOPHAGUS W SINGLE CM (SOL OR THIN BA)  Result Date: 12/01/2022 CLINICAL DATA:  Dysphagia.  Vomiting. EXAM: ESOPHOGRAM/BARIUM SWALLOW TECHNIQUE: Single contrast examination was performed using  thin barium . FLUOROSCOPY: Radiation Exposure Index (as provided by the fluoroscopic device): 20.1 mGy Kerma COMPARISON:  None Available. FINDINGS: There is severe narrowing of the distal esophagus and gastroesophageal junction with bird beak appearance most consistent with achalasia. Mild esophageal dilatation is noted. A small amount of contrast is seen passing through the gastroesophageal junction into the stomach. IMPRESSION: Severe narrowing of distal esophagus and gastroesophageal junction with bird's beak appearance most consistent with achalasia. Endoscopy is recommended for further evaluation. Electronically Signed   By: Marijo Conception M.D.   On: 12/01/2022 15:38   CT CHEST WO CONTRAST  Result Date: 11/28/2022 CLINICAL DATA:  Pneumonia, complication suspected.  Sepsis.  Fever. EXAM: CT CHEST WITHOUT CONTRAST TECHNIQUE: Multidetector CT imaging of the chest was performed following the standard protocol without IV contrast. RADIATION DOSE REDUCTION: This exam was performed according to the departmental dose-optimization program which includes automated exposure control, adjustment of the mA and/or kV according to patient size and/or use of iterative reconstruction technique. COMPARISON:  Chest radiograph November 25, 2022 and esophagram December 11, 2021 FINDINGS: Cardiovascular: Aortic atherosclerosis. Cardiac enlargement. Small pericardial effusion. Coronary artery calcifications. Mediastinum/Nodes: Prominent mediastinal lymph nodes and hilar nodal tissue not pathologically  enlarged by size criteria and favored reactive. Fluid-filled patulous esophagus compatible with findings of achalasia seen on prior esophagram. Lungs/Pleura: Masslike consolidations in the bilateral lower lobes. Additional multifocal airspace opacities in the bilateral upper lobes and right middle lobe. Small bilateral pleural effusions. Upper Abdomen: No acute abnormality. Musculoskeletal: Remote left lateral rib fractures. Multilevel degenerative changes spine. Degenerative change of the bilateral glenohumeral joints. IMPRESSION: 1. Masslike consolidations in the bilateral lower lobes with additional multifocal airspace opacities in the bilateral upper lobes and right middle lobe,  favored to reflect multifocal pneumonia. Recommend follow-up imaging to ensure resolution. 2. Small bilateral pleural effusions. 3. Fluid-filled patulous esophagus compatible with findings of achalasia seen on prior esophagram. 4. Cardiac enlargement with small pericardial effusion, consider further evaluation with echocardiography. 5.  Aortic Atherosclerosis (ICD10-I70.0). Electronically Signed   By: Dahlia Bailiff M.D.   On: 11/28/2022 12:11   ECHOCARDIOGRAM COMPLETE  Result Date: 11/26/2022    ECHOCARDIOGRAM REPORT   Patient Name:   Henry [name] Date of Exam: 11/26/2022 Medical Rec #:  [id number]    Height:       71.0 in Accession #:    [id number]   Weight:       200.6 lb Date of Birth:  05-24-1951    BSA:          2.111 m Patient Age:    17 years     BP:           103/64 mmHg Patient Gender: M            HR:           102 bpm. Exam Location:  [facility] Procedure: 2D Echo, Cardiac Doppler and Color Doppler Indications:    CHF-Acute Diastolic D42.87  History:        Patient has no prior history of Echocardiogram examinations.                 Risk Factors:Diabetes. Acute respiratory failure with hypoxia                 and Positive for FLU A at present, Mental disability (From Hx).  Sonographer:    Alvino Chapel RCS Referring Phys:  [phone] DAVID TAT  Sonographer Comments: Patient could NOT follow instructions to "sniff". IMPRESSIONS  1. Global hypokinesis worse in the inferior base . Left ventricular ejection fraction, by estimation, is 25 to 30%. The left ventricle has severely decreased function. The left ventricle demonstrates global hypokinesis. The left ventricular internal cavity size was moderately dilated. Left ventricular diastolic parameters are indeterminate.  2. Some septal flattening suggesting elevated PA pressure TR velocity inadequate to estimate . Right ventricular systolic function is mildly reduced. The right ventricular size is mildly enlarged.  3. Left atrial size was moderately dilated.  4. The mitral valve is normal in structure. Trivial mitral valve regurgitation. No evidence of mitral stenosis.  5. The aortic valve is tricuspid. Aortic valve regurgitation is not visualized. No aortic stenosis is present.  6. The inferior vena cava is dilated in size with >50% respiratory variability, suggesting right atrial pressure of 8 mmHg. FINDINGS  Left Ventricle: Global hypokinesis worse in the inferior base. Left ventricular ejection fraction, by estimation, is 25 to 30%. The left ventricle has severely decreased function. The left ventricle demonstrates global hypokinesis. Definity contrast agent was given IV to delineate the left ventricular endocardial borders. The left ventricular internal cavity size was moderately dilated. There is no left ventricular hypertrophy. Left ventricular diastolic parameters are indeterminate. Right Ventricle: Some septal flattening suggesting elevated PA pressure TR velocity inadequate to estimate. The right ventricular size is mildly enlarged. No increase in right ventricular wall thickness. Right ventricular systolic function is mildly reduced. Left Atrium: Left atrial size was moderately dilated. Right Atrium: Right atrial size was normal in size. Pericardium: There is no evidence of pericardial  effusion. Mitral Valve: The mitral valve is normal in structure. Trivial mitral valve regurgitation. No evidence of mitral valve stenosis. Tricuspid Valve: The tricuspid valve is normal  in structure. Tricuspid valve regurgitation is mild . No evidence of tricuspid stenosis. Aortic Valve: The aortic valve is tricuspid. Aortic valve regurgitation is not visualized. No aortic stenosis is present. Pulmonic Valve: The pulmonic valve was normal in structure. Pulmonic valve regurgitation is trivial. No evidence of pulmonic stenosis. Aorta: The aortic root is normal in size and structure. Venous: The inferior vena cava is dilated in size with greater than 50% respiratory variability, suggesting right atrial pressure of 8 mmHg. IAS/Shunts: No atrial level shunt detected by color flow Doppler.  LEFT VENTRICLE PLAX 2D LVIDd:         5.60 cm      Diastology LVIDs:         4.60 cm      LV e' medial:    6.64 cm/s LV PW:         1.00 cm      LV E/e' medial:  12.2 LV IVS:        1.10 cm      LV e' lateral:   8.27 cm/s LVOT diam:     2.00 cm      LV E/e' lateral: 9.8 LV SV:         52 LV SV Index:   25 LVOT Area:     3.14 cm  LV Volumes (MOD) LV vol d, MOD A2C: 116.0 ml LV vol d, MOD A4C: 126.0 ml LV vol s, MOD A2C: 92.4 ml LV vol s, MOD A4C: 81.5 ml LV SV MOD A2C:     23.6 ml LV SV MOD A4C:     126.0 ml LV SV MOD BP:      37.7 ml RIGHT VENTRICLE RV S prime:     12.65 cm/s TAPSE (M-mode): 1.5 cm LEFT ATRIUM             Index        RIGHT ATRIUM           Index LA diam:        4.40 cm 2.08 cm/m   RA Area:     24.70 cm LA Vol (A2C):   70.4 ml 33.34 ml/m  RA Volume:   82.00 ml  38.84 ml/m LA Vol (A4C):   65.2 ml 30.88 ml/m LA Biplane Vol: 74.5 ml 35.29 ml/m  AORTIC VALVE LVOT Vmax:   97.60 cm/s LVOT Vmean:  74.900 cm/s LVOT VTI:    0.166 m  AORTA Ao Root diam: 3.50 cm MITRAL VALVE               TRICUSPID VALVE MV Area (PHT): 4.94 cm    TR Peak grad:   40.7 mmHg MV Decel Time: 154 msec    TR Vmax:        319.00 cm/s MV E  velocity: 80.70 cm/s MV A velocity: 53.30 cm/s  SHUNTS MV E/A ratio:  1.51        Systemic VTI:  0.17 m                            Systemic Diam: 2.00 cm Jenkins Rouge MD Electronically signed by Jenkins Rouge MD Signature Date/Time: 11/26/2022/4:51:55 PM    Final    DG Chest Port 1 View  Result Date: 11/25/2022 CLINICAL DATA:  Shortness of breath. EXAM: PORTABLE CHEST 1 VIEW COMPARISON:  Chest radiograph dated 04/15/2022. FINDINGS: Bilateral mid to lower lung field confluent nodular densities most consistent with developing infiltrate. Trace left  pleural effusion suspected. No pneumothorax. Stable cardiomegaly. No acute osseous pathology. IMPRESSION: Bilateral mid to lower lung field developing infiltrate. Electronically Signed   By: Anner Crete M.D.   On: 11/25/2022 19:19    Irwin Brakeman, MD  [facility]  If 7PM-7AM, please contact night-coverage [email or web address] Password [id number] 12/04/2022, 5:58 PM   LOS: 8 days

## 2022-12-04 NOTE — Progress Notes (Addendum)
Progress Note  Patient Name: Henry [name] Date of Encounter: 12/04/2022  Primary Cardiologist: Jenkins Rouge, MD  Subjective   Patient perseverating on breakfast tray not yet being delivered. Sister at bedside. No symptoms or palpitations with brief a-tach this AM. No CP. SOB improving.  Inpatient Medications    Scheduled Meds:  botulinum toxin Type A  100 Units Intramuscular To Endo   digoxin  0.125 mg Intravenous Daily   heparin  5,000 Units Subcutaneous Q8H   insulin aspart  0-5 Units Subcutaneous QHS   insulin aspart  0-9 Units Subcutaneous TID WC   metoprolol tartrate  5 mg Intravenous Q6H   ondansetron  4 mg Intravenous Once   pantoprazole (PROTONIX) IV  40 mg Intravenous Q24H   prochlorperazine  5 mg Intravenous Q6H   Continuous Infusions:  cefTRIAXone (ROCEPHIN)  IV 2 g (12/03/22 2006)   PRN Meds: acetaminophen **OR** acetaminophen, ipratropium-albuterol, melatonin, ondansetron **OR** ondansetron (ZOFRAN) IV   Vital Signs    Vitals:   12/03/22 1221 12/03/22 1256 12/03/22 2040 12/04/22 0401  BP: 120/70 134/73 131/76 119/70  Pulse:   89 80  Resp: (!) 35 '20 18 20  '$ Temp: 99.7 F (37.6 C) 98.6 F (37 C) 98.3 F (36.8 C) 98 F (36.7 C)  TempSrc:  Oral    SpO2: (!) 89% 93% 96% 95%  Weight:    86 kg  Height:        Intake/Output Summary (Last 24 hours) at 12/04/2022 0833 Last data filed at 12/04/2022 0300 Gross per 24 hour  Intake 650 ml  Output 400 ml  Net 250 ml      12/04/2022    4:01 AM 12/03/2022   11:39 AM 12/03/2022    3:57 AM  Last 3 Weights  Weight (lbs) 189 lb 9.5 oz 189 lb 9.5 oz 190 lb 11.2 oz  Weight (kg) 86 kg 86 kg 86.5 kg     Telemetry    NSR, occ PVCs/couplets, brief atrial tach this AM - Personally Reviewed  Physical Exam   GEN: No acute distress.  HEENT: Normocephalic, atraumatic, sclera non-icteric. Bilateral pstosis. Neck: No JVD or bruits. Cardiac: RRR no murmurs, rubs, or gallops.  Respiratory: Coarse BS bilaterally. Breathing is  unlabored. Poor inspiratory effort. GI: Soft, nontender, non-distended, BS +x 4. MS: no deformity. Extremities: No clubbing or cyanosis. No edema. Distal pedal pulses are 2+ and equal bilaterally. Neuro:  A+O to self, hospital. Follows commands with begrudging affect Psych:  Tangential   Labs    Chemistry Recent Labs  Lab 12/02/22 0404 12/03/22 0326 12/04/22 0305  NA 137 137 137  K 3.2* 3.7 3.5  CL 94* 96* 98  CO2 '31 31 29  '$ GLUCOSE 187* 148* 150*  BUN '15 14 13  '$ CREATININE 0.53* 0.60* 0.58*  CALCIUM 7.6* 7.7* 7.6*  GFRNONAA >60 >60 >60  ANIONGAP '12 10 10     '$ Hematology Recent Labs  Lab 11/28/22 0416 11/29/22 0400 11/30/22 0617  WBC 14.3* 11.4* 7.7  RBC 4.13* 3.92* 4.03*  HGB 11.8* 11.5* 11.5*  HCT 36.2* 33.9* 34.3*  MCV 87.7 86.5 85.1  MCH 28.6 29.3 28.5  MCHC 32.6 33.9 33.5  RDW 15.1 15.2 15.3  PLT 173 159 159    BNP Recent Labs  Lab 11/28/22 0746 12/03/22 0326  BNP 809.0* 845.0*     Radiology    No results found.  Cardiac Studies   2d echo 11/26/22   1. Global hypokinesis worse in the inferior base . Left  ventricular  ejection fraction, by estimation, is 25 to 30%. The left ventricle has  severely decreased function. The left ventricle demonstrates global  hypokinesis. The left ventricular internal  cavity size was moderately dilated. Left ventricular diastolic parameters  are indeterminate.   2. Some septal flattening suggesting elevated PA pressure TR velocity  inadequate to estimate . Right ventricular systolic function is mildly  reduced. The right ventricular size is mildly enlarged.   3. Left atrial size was moderately dilated.   4. The mitral valve is normal in structure. Trivial mitral valve  regurgitation. No evidence of mitral stenosis.   5. The aortic valve is tricuspid. Aortic valve regurgitation is not  visualized. No aortic stenosis is present.   6. The inferior vena cava is dilated in size with >50% respiratory  variability,  suggesting right atrial pressure of 8 mmHg.   Patient Profile     72 y.o. male with acid reflux, type 2 DM, severe hearing loss, multiple family sick contacts recently who presented to [facility] with acute hypoxic respiratory failure, found to have lactic acidosis and influenza A. Also diagnosed with severe sepsis, hypomagnesemia, hypokalemia, lobar PNA with streptococcal PNA as well as component of aspiration pneumonitis. Hospital course complicated by intractable vomiting and esophageal dysphagia, found to have achalasia requiring NPO status. Cardiology consulted due to atrial arrhythmias - felt moreso atrial tach and PVCs rather than afib, also with acute HFrEF 25-30% on echo of unclear chronicity. On 12/03/22, underwent botox injection of achalasia - study also showed dilation in upper third and middle third of esophagus, 3cm hiatal hernia, and gastritis.  Assessment & Plan    1. Multiple medical issues including acute hypoxic respiratory failure with influenza A, strep PNA, achalasia - per medicine/GI - full liquid diet planned today  2. Acute HFrEF with RV dysfunction as well - etiology of CM unclear, may be stress induced from multiple physiologic stressors - patient is poor historian so unclear any history of recent ACS symptoms - did not have troponins this admission; would be low yield to repeat this far out - BP prohibits aggressive GDMT - as below, will review next steps with MD regarding regimen and further workup - anticipate given frailty and poor cooperation with exam that conservative management with medical therapy would be appropriate to start with - volume status looks reasonable  3. Atrial tachycardia, PVCs - will discuss regimen with Dr. Domenic Polite now that he has progressed to liquid diet - given that he is in NSR most of the time, not sure to what degree the digoxin is helping much - will hold this for now and continue IV metoprolol pending further recs - check TSH with labs -  lyte management as below  4. Hypocalcemia, hypomagnsemia, hypokalemia - given swallowing issues, defer electrolyte management to primary team, recommend maintaining normal electrolytes given atrial arrhythmias  Tentatively arranged 2 week cardiology f/u, [place] had next available 1/19. Appt info placed on AVS.   For questions or updates, please contact [facility] Please consult [email or web address] for contact info under Cardiology/STEMI.  Signed, Charlie Pitter, PA-C 12/04/2022, 8:33 AM      Attending note:  Hospital course reviewed since GI testing yesterday, case discussed with Ms. Purcell Mouton.  He underwent EGD with Dr. Abbey Chatters yesterday including dilatation of the esophagus and botulinum toxin treatment for achalasia.  Written for liquid diet today.  Otherwise remains on IV medications for now.  Recent vital signs with heart rate in the 80s in sinus  rhythm by telemetry and blood pressure 119/70.  Brief episode of atrial tachycardia noted that was asymptomatic.  Pertinent lab work includes potassium 3.5, BUN 13, creatinine 0.58.  IV digoxin discontinued this morning, he otherwise remains on IV Lopressor 5 mg every 6 hours.  Would recommend conservative management of cardiomyopathy with medical therapy as tolerated.  Deferring ischemic evaluation at this point.  If he is able to tolerate oral intake, suggest transitioning to Toprol-XL 50 mg tomorrow.  Blood pressure may well preclude further adjustments particularly as it relates to ARNI, however he may ultimately be a candidate for low-dose Aldactone and possibly SGLT2 inhibitor.  Satira Sark, M.D., F.A.C.C.

## 2022-12-04 NOTE — Progress Notes (Signed)
Gastroenterology Progress Note   Referring Provider: No ref. provider found Primary Care Physician:  Henry Labrum, MD Primary Gastroenterologist:  Dr. Abbey [name]  Patient ID: Henry [name]; [id number]; 07-Nov-1951    Subjective   Feeling well this morning. Sister at bedside and reports he is hard of hearing. Has been able to eat small bites of icee and tolerate liquids okay without regurgitation. Despite full liquid diet ordered it appears there was attempt at having eggs this morning which he was not able to tolerate.    Objective   Vital signs in last 24 hours Temp:  [98 F (36.7 C)-99.7 F (37.6 C)] 98 F (36.7 C) (01/05 0401) Pulse Rate:  [80-89] 80 (01/05 0401) Resp:  [18-35] 20 (01/05 0401) BP: (119-170)/(60-76) 119/70 (01/05 0401) SpO2:  [89 %-96 %] 95 % (01/05 0401) Weight:  [86 kg] 86 kg (01/05 0401) Last BM Date : 12/02/22  Physical Exam General:   Alert, pleasant Head:  Normocephalic and atraumatic. Eyes:  No icterus, sclera clear. Conjuctiva pink.  Mouth:  Without lesions, mucosa pink and moist.  Abdomen:  Bowel sounds present, soft, non-tender, non-distended. No HSM or hernias noted. No rebound or guarding. No masses appreciated  Extremities:  Without clubbing or edema. Neurologic:  Alert, follows commands. Psych:  Alert and cooperative. Normal [name] and affect.  Intake/Output from previous day: 01/04 0701 - 01/05 0700 In: 650 [P.O.:150; I.V.:400; IV Piggyback:100] Out: 400 [Urine:400] Intake/Output this shift: No intake/output data recorded.  Lab Results  No results for input(s): "WBC", "HGB", "HCT", "PLT" in the last 72 hours. BMET Recent Labs    12/02/22 0404 12/03/22 0326 12/04/22 0305  NA 137 137 137  K 3.2* 3.7 3.5  CL 94* 96* 98  CO2 '31 31 29  '$ GLUCOSE 187* 148* 150*  BUN '15 14 13  '$ CREATININE 0.53* 0.60* 0.58*  CALCIUM 7.6* 7.7* 7.6*   LFT No results for input(s): "PROT", "ALBUMIN", "AST", "ALT", "ALKPHOS", "BILITOT", "BILIDIR",  "IBILI" in the last 72 hours. PT/INR No results for input(s): "LABPROT", "INR" in the last 72 hours. Hepatitis Panel No results for input(s): "HEPBSAG", "HCVAB", "HEPAIGM", "HEPBIGM" in the last 72 hours.   Studies/Results DG ESOPHAGUS W SINGLE CM (SOL OR THIN BA)  Result Date: 12/01/2022 CLINICAL DATA:  Dysphagia.  Vomiting. EXAM: ESOPHOGRAM/BARIUM SWALLOW TECHNIQUE: Single contrast examination was performed using  thin barium . FLUOROSCOPY: Radiation Exposure Index (as provided by the fluoroscopic device): 20.1 mGy Kerma COMPARISON:  None Available. FINDINGS: There is severe narrowing of the distal esophagus and gastroesophageal junction with bird beak appearance most consistent with achalasia. Mild esophageal dilatation is noted. A small amount of contrast is seen passing through the gastroesophageal junction into the stomach. IMPRESSION: Severe narrowing of distal esophagus and gastroesophageal junction with bird's beak appearance most consistent with achalasia. Endoscopy is recommended for further evaluation. Electronically Signed   By: Henry [name] M.D.   On: 12/01/2022 15:38   CT CHEST WO CONTRAST  Result Date: 11/28/2022 CLINICAL DATA:  Pneumonia, complication suspected.  Sepsis.  Fever. EXAM: CT CHEST WITHOUT CONTRAST TECHNIQUE: Multidetector CT imaging of the chest was performed following the standard protocol without IV contrast. RADIATION DOSE REDUCTION: This exam was performed according to the departmental dose-optimization program which includes automated exposure control, adjustment of the mA and/or kV according to patient size and/or use of iterative reconstruction technique. COMPARISON:  Chest radiograph November 25, 2022 and esophagram December 11, 2021 FINDINGS: Cardiovascular: Aortic atherosclerosis. Cardiac enlargement. Small pericardial effusion. Coronary  artery calcifications. Mediastinum/Nodes: Prominent mediastinal lymph nodes and hilar nodal tissue not pathologically  enlarged by size criteria and favored reactive. Fluid-filled patulous esophagus compatible with findings of achalasia seen on prior esophagram. Lungs/Pleura: Masslike consolidations in the bilateral lower lobes. Additional multifocal airspace opacities in the bilateral upper lobes and right middle lobe. Small bilateral pleural effusions. Upper Abdomen: No acute abnormality. Musculoskeletal: Remote left lateral rib fractures. Multilevel degenerative changes spine. Degenerative change of the bilateral glenohumeral joints. IMPRESSION: 1. Masslike consolidations in the bilateral lower lobes with additional multifocal airspace opacities in the bilateral upper lobes and right middle lobe, favored to reflect multifocal pneumonia. Recommend follow-up imaging to ensure resolution. 2. Small bilateral pleural effusions. 3. Fluid-filled patulous esophagus compatible with findings of achalasia seen on prior esophagram. 4. Cardiac enlargement with small pericardial effusion, consider further evaluation with echocardiography. 5.  Aortic Atherosclerosis (ICD10-I70.0). Electronically Signed   By: Henry [name] M.D.   On: 11/28/2022 12:11   ECHOCARDIOGRAM COMPLETE  Result Date: 11/26/2022    ECHOCARDIOGRAM REPORT   Patient Name:   Henry [name] Date of Exam: 11/26/2022 Medical Rec #:  [id number]    Height:       71.0 in Accession #:    [id number]   Weight:       200.6 lb Date of Birth:  [birth year]    BSA:          2.111 m Patient Age:    61 years     BP:           103/64 mmHg Patient Gender: M            HR:           102 bpm. Exam Location:  [facility] Procedure: 2D Echo, Cardiac Doppler and Color Doppler Indications:    CHF-Acute Diastolic W10.93  History:        Patient has no prior history of Echocardiogram examinations.                 Risk Factors:Diabetes. Acute respiratory failure with hypoxia                 and Positive for FLU A at present, Mental disability (From Hx).  Sonographer:    Henry [name] RCS Referring Phys:  [phone] Henry [name]  Sonographer Comments: Patient could NOT follow instructions to "sniff". IMPRESSIONS  1. Global hypokinesis worse in the inferior base . Left ventricular ejection fraction, by estimation, is 25 to 30%. The left ventricle has severely decreased function. The left ventricle demonstrates global hypokinesis. The left ventricular internal cavity size was moderately dilated. Left ventricular diastolic parameters are indeterminate.  2. Some septal flattening suggesting elevated PA pressure TR velocity inadequate to estimate . Right ventricular systolic function is mildly reduced. The right ventricular size is mildly enlarged.  3. Left atrial size was moderately dilated.  4. The mitral valve is normal in structure. Trivial mitral valve regurgitation. No evidence of mitral stenosis.  5. The aortic valve is tricuspid. Aortic valve regurgitation is not visualized. No aortic stenosis is present.  6. The inferior vena cava is dilated in size with >50% respiratory variability, suggesting right atrial pressure of 8 mmHg. FINDINGS  Left Ventricle: Global hypokinesis worse in the inferior base. Left ventricular ejection fraction, by estimation, is 25 to 30%. The left ventricle has severely decreased function. The left ventricle demonstrates global hypokinesis. Definity contrast agent was given IV to delineate the left ventricular endocardial borders. The left ventricular internal cavity  size was moderately dilated. There is no left ventricular hypertrophy. Left ventricular diastolic parameters are indeterminate. Right Ventricle: Some septal flattening suggesting elevated PA pressure TR velocity inadequate to estimate. The right ventricular size is mildly enlarged. No increase in right ventricular wall thickness. Right ventricular systolic function is mildly reduced. Left Atrium: Left atrial size was moderately dilated. Right Atrium: Right atrial size was normal in size. Pericardium: There is no evidence of pericardial  effusion. Mitral Valve: The mitral valve is normal in structure. Trivial mitral valve regurgitation. No evidence of mitral valve stenosis. Tricuspid Valve: The tricuspid valve is normal in structure. Tricuspid valve regurgitation is mild . No evidence of tricuspid stenosis. Aortic Valve: The aortic valve is tricuspid. Aortic valve regurgitation is not visualized. No aortic stenosis is present. Pulmonic Valve: The pulmonic valve was normal in structure. Pulmonic valve regurgitation is trivial. No evidence of pulmonic stenosis. Aorta: The aortic root is normal in size and structure. Venous: The inferior vena cava is dilated in size with greater than 50% respiratory variability, suggesting right atrial pressure of 8 mmHg. IAS/Shunts: No atrial level shunt detected by color flow Doppler.  LEFT VENTRICLE PLAX 2D LVIDd:         5.60 cm      Diastology LVIDs:         4.60 cm      LV e' medial:    6.64 cm/s LV PW:         1.00 cm      LV E/e' medial:  12.2 LV IVS:        1.10 cm      LV e' lateral:   8.27 cm/s LVOT diam:     2.00 cm      LV E/e' lateral: 9.8 LV SV:         52 LV SV Index:   25 LVOT Area:     3.14 cm  LV Volumes (MOD) LV vol d, MOD A2C: 116.0 ml LV vol d, MOD A4C: 126.0 ml LV vol s, MOD A2C: 92.4 ml LV vol s, MOD A4C: 81.5 ml LV SV MOD A2C:     23.6 ml LV SV MOD A4C:     126.0 ml LV SV MOD BP:      37.7 ml RIGHT VENTRICLE RV S prime:     12.65 cm/s TAPSE (M-mode): 1.5 cm LEFT ATRIUM             Index        RIGHT ATRIUM           Index LA diam:        4.40 cm 2.08 cm/m   RA Area:     24.70 cm LA Vol (A2C):   70.4 ml 33.34 ml/m  RA Volume:   82.00 ml  38.84 ml/m LA Vol (A4C):   65.2 ml 30.88 ml/m LA Biplane Vol: 74.5 ml 35.29 ml/m  AORTIC VALVE LVOT Vmax:   97.60 cm/s LVOT Vmean:  74.900 cm/s LVOT VTI:    0.166 m  AORTA Ao Root diam: 3.50 cm MITRAL VALVE               TRICUSPID VALVE MV Area (PHT): 4.94 cm    TR Peak grad:   40.7 mmHg MV Decel Time: 154 msec    TR Vmax:        319.00 cm/s MV E  velocity: 80.70 cm/s MV A velocity: 53.30 cm/s  SHUNTS MV E/A ratio:  1.51  Systemic VTI:  0.17 m                            Systemic Diam: 2.00 cm Jenkins Rouge MD Electronically signed by Jenkins Rouge MD Signature Date/Time: 11/26/2022/4:51:55 PM    Final    DG Chest Port 1 View  Result Date: 11/25/2022 CLINICAL DATA:  Shortness of breath. EXAM: PORTABLE CHEST 1 VIEW COMPARISON:  Chest radiograph dated 04/15/2022. FINDINGS: Bilateral mid to lower lung field confluent nodular densities most consistent with developing infiltrate. Trace left pleural effusion suspected. No pneumothorax. Stable cardiomegaly. No acute osseous pathology. IMPRESSION: Bilateral mid to lower lung field developing infiltrate. Electronically Signed   By: Anner Crete M.D.   On: 11/25/2022 19:19    Assessment  72 y.o. male with longstanding history of esophageal dysphagia who preseted to the hospital with recurrent pneumonia (+ PCR for influenza A and  streptococcal pneumonia bacteremia) and acute exacerbation of esophageal dysphagia characterized by regurgitation of liquids and solids. GI consulted for further evaluation and treatment.   Dysphagia/Achalasia: History of EGD in January 2023 with findings suggestive of achalsia however previous attempts at confirmation via manometry have failed. He underwent BPE 12/01/22 with finding of severe narrowing of distal esophagus and GE junction with bird beak appearance most consistent with achalasia and high suspicion for aspiration c/b presence of dentures and swallowing larger food boluses.He underwent EGD 12/03/22 with Dr. Abbey [name] s/p esophageal dilation and botox injection to LES for treatment of achalasia given no candidacy for traditional definitive treatment. He should remain on daily PPI and monitored closely for recurrent symptoms and receive close and regular outpatient follow up. Tolerating small sips of liquids at this time. Continue full liquid diet and very slow  advancement of diet. Will need moist food and potentially full liquids long term. Medications will need to be crushed for now.   Per discussion with patients sister at bedside today, her and the other family members have decided that if this recent treatment with botox is not effective that they will likely pursue comfort care given his recurrent aspirations and no consideration for long term feeding via PEG tube or other alternative.   Plan / Recommendations  Continue full liquid diet today, consider very slow advancement of diet. Likely will need to continue full liquid diet going forward Remain upright for 30 minutes at least after meals, small bites encouraged.  Continue daily PPI Will need follow up in clinic in 3-4 weeks to reassess dysphagia    LOS: 8 days    12/04/2022, 9:30 AM   Venetia Night, MSN, FNP-BC, AGACNP-BC [facility]

## 2022-12-04 NOTE — Care Management Important Message (Signed)
Important Message  Patient Details  Name: Henry [name] MRN: [id number] Date of Birth: 10-14-1951   Medicare Important Message Given:  Yes     Klaus Medal 12/04/2022, 11:58 AM

## 2022-12-05 DIAGNOSIS — E872 Acidosis, unspecified: Secondary | ICD-10-CM | POA: Diagnosis not present

## 2022-12-05 DIAGNOSIS — R7881 Bacteremia: Secondary | ICD-10-CM | POA: Diagnosis not present

## 2022-12-05 DIAGNOSIS — J101 Influenza due to other identified influenza virus with other respiratory manifestations: Secondary | ICD-10-CM | POA: Diagnosis not present

## 2022-12-05 DIAGNOSIS — I4719 Other supraventricular tachycardia: Secondary | ICD-10-CM | POA: Diagnosis not present

## 2022-12-05 LAB — GLUCOSE, CAPILLARY
Glucose-Capillary: 130 mg/dL — ABNORMAL HIGH (ref 70–99)
Glucose-Capillary: 203 mg/dL — ABNORMAL HIGH (ref 70–99)

## 2022-12-05 MED ORDER — METOPROLOL SUCCINATE ER 50 MG PO TB24: 50.0000 mg | ORAL_TABLET | Freq: Every day | ORAL | 1 refills | Status: AC

## 2022-12-05 MED ORDER — METOPROLOL SUCCINATE ER 50 MG PO TB24
50.0000 mg | ORAL_TABLET | Freq: Every day | ORAL | Status: DC
  Filled 2022-12-05: qty 1

## 2022-12-05 NOTE — Progress Notes (Signed)
DME note  The patient is confined to one level of the home environment and there is no toilet on that level, Weakness, needs bed side commode.

## 2022-12-05 NOTE — Progress Notes (Signed)
Ng Discharge Note  Admit Date:  11/25/2022 Discharge date: 12/05/2022   Henry [name] to be D/C'd Home per MD order.  AVS completed. Patient/caregiver able to verbalize understanding.  Discharge Medication: Allergies as of 12/05/2022   No Known Allergies      Medication List     STOP taking these medications    albuterol 108 (90 Base) MCG/ACT inhaler Commonly known as: VENTOLIN HFA   CORICIDIN HBP COLD/FLU PO       TAKE these medications    acetaminophen 500 MG tablet Commonly known as: TYLENOL Take 1,000 mg by mouth every 6 (six) hours as needed for fever.   cetirizine 10 MG tablet Commonly known as: ZYRTEC Take 10 mg by mouth daily.   diphenhydramine-acetaminophen 25-500 MG Tabs tablet Commonly known as: TYLENOL PM Take 1 tablet by mouth at bedtime as needed (pain/sleep).   metFORMIN 500 MG tablet Commonly known as: GLUCOPHAGE Take 500 mg by mouth 2 (two) times daily.   metoprolol succinate 50 MG 24 hr tablet Commonly known as: TOPROL-XL Take 1 tablet (50 mg total) by mouth daily. Take with or immediately following a meal. Start taking on: December 06, 2022   multivitamin with minerals tablet Take 1 tablet by mouth daily.   omeprazole 20 MG capsule Commonly known as: PRILOSEC Take 20 mg by mouth daily.   sertraline 25 MG tablet Commonly known as: ZOLOFT Take 25 mg by mouth daily.               Durable Medical Equipment  (From admission, onward)           Start     Ordered   12/05/22 1215  For home use only DME Eelevated commode seat  Once        12/05/22 1214   12/05/22 1214  For home use only DME Shower stool  Once        12/05/22 1214   12/05/22 1214  For home use only DME Walker rolling  Once       Question Answer Comment  Walker: With 5 Inch Wheels   Patient needs a walker to treat with the following condition Gait instability      12/05/22 1214            Discharge Assessment: Vitals:   12/04/22 2054 12/05/22 0416  BP:  113/87 128/80  Pulse: 75 71  Resp: 18 19  Temp: 98.6 F (37 C) 97.6 F (36.4 C)  SpO2: 97% 97%   Skin clean, dry and intact without evidence of skin break down, no evidence of skin tears noted. IV catheter discontinued intact. Site without signs and symptoms of complications - no redness or edema noted at insertion site, patient denies c/o pain - only slight tenderness at site.  Dressing with slight pressure applied.  D/c Instructions-Education: Discharge instructions given to patient/family with verbalized understanding. D/c education completed with patient/family including follow up instructions, medication list, d/c activities limitations if indicated, with other d/c instructions as indicated by MD - patient able to verbalize understanding, all questions fully answered. Patient instructed to return to ED, call 911, or call MD for any changes in condition.  Patient escorted via [place], and D/C home via private auto.  Richrd Prime, LPN 03/01/7061 37:62 PM

## 2022-12-05 NOTE — Discharge Summary (Addendum)
Physician Discharge Summary  Henry [name] GYJ:[id number] DOB: 07-11-1951 DOA: 11/25/2022  PCP: Curlene Labrum, MD  Admit date: 11/25/2022 Discharge date: 12/05/2022  Admitted From:  Home  Disposition:  Home with Yancey   Recommendations for Outpatient Follow-up:  Follow up with [facility] in 1 month Follow up with cardiology in 2 weeks  Follow up with PCP in 1 week  Please obtain BMP in 1-2 weeks   Home Health:  PT, RN   Discharge Condition: STABLE   CODE STATUS: DNR DIET: full liquids only: 2 gm sodium restricted  / handout given on AVS  Brief Hospitalization Summary: Please see all hospital notes, images, labs for full details of the hospitalization. ADMISSION HPI:   72 year old male with a history of hearing impairment, esophageal dysphagia, GERD, diabetes mellitus type 2 presenting with fever, coughing, and vomiting for the last 2 to 3 days.  He has had increasing shortness of breath.  Apparently multiple family members have been sick. Reportedly patient lives with his sister. Caregiver is not here in the ER now.   Bedside RN states that family members took the patient's hearing aids home.  Patient has severe hearing loss.  Difficult to communicate.   In the ED, the patient was febrile up to 101.2 F with soft blood pressure down to 90/62.  Oxygen saturation was 85% room air.  WBC 7.4, hemoglobin 12.9, platelets 171,000.  BMP showed sodium 130, potassium 3.9, bicarbonate 25, serum creatinine 0.79.  AST 71, ALT 32, alk phosphatase 84, total bilirubin 1.2.  Lactic acid peaked at 4.6.  The patient was started on oseltamavir.  Influenza PCR was positive.  The patient became increasingly hypoxemic requiring high flow nasal cannula up to 15 L.  The patient has slowly improved with IV abx.  He was found to have acute HFrEF with EF 25-30% and started on IV lasix.  His hospitalization has been prolonged by his slow improvement as well as his intractable vomiting and SVT/atrial  tachycardia.  HOSPITAL COURSE BY PROBLEM   Severe Sepsis - RESOLVED -present on admission -fever, tachycardia, AMS, resp failure -due to bacteremia, pneumonia -PCT 9.33>>57.78 -sepsis physiology resolved   Streptococcus pneumonia bacteremia -source = pneumonia -personally reviewed CXR--bilateral patchy opacities R>L -initially on Unasyn>>changed to ceftriaxone 2 g IV daily, completed doses 12/04/22   Lobar Pneumonia -there is a component of aspiration pneumonitis -initially on unasyn>>ceftriaxone   Acute respiratory failure with hypoxia -11/26/22 ABG 7.4 2/37/188/24 (15L) -now down to 4L>>3L -Secondary to influenza, pulm edema, pneumonia -has component of aspiration pneumonitis with a history of esophageal dysphagia (Achalasia) and vomiting -Wean oxygen as tolerated -The patient did have signs of volume overload that was treated -BNP 2649>>845   Acute HFrEF / Cardiomyopathy -12/28 Echo--EF 35-30% -received 4 days IV lasix--last dose 12/01/22 -appreciate cardiology input; conservative mgmt of cardiomyopathy for now recommended -low BP and vomiting limiting GDMP presently>>initiate gradually as BP improves -12/30 ReDS vest reading--44 -12/01/22 ReDS vest--30 -toprol XL 50 mg daily  -sodium restricted diet recommended   Atrial tachycardia -personally reviewed EKG>>atrial tach -treated with IV digoxin and discontinued by cardiology -continue lopressor IV >>increased to 5 mg IV q6>>transitioned to oral toprol -optimized electrolytes -still having occasional non-sustained short runs, appreciate cardiology team recommendations/assistance -started on toprol XL 50 mg daily starting 12/05/22 per cardio recommendations -Pt has outpatient follow up with cardiology in about 2 weeks for recheck    Esophageal dysphagia/Achalasia/Intractable vomiting -Given the patient's history of this and respiratory failure, concern about aspiration  pneumonitis -seen by speech>>dys 3 with thin -GI  consult appreciated -s/p barium pill esophagram--Severe narrowing of distal esophagus and gastroesophageal junction with bird's beak appearance most consistent with achalasia -reconsulted GI with plan for EGD/Botox on 12/03/22 which was completed -pt had Botox treatment and has tolerated full liquid diet 1/4 which he was advised to continue at home indefinitely -follow up with GI in 1 month    Influenza A -continue oseltamavir to complete full course -Check PCT 9.33 -now on full liquid diet which he will continue   Lactic acidosis -resolved with treatments    Diabetes mellitus type 2 with hyperglycemia -Check A1c--8.3 -NovoLog sliding scale -Holding metformin CBG (last 3)  Recent Labs    12/04/22 2053 12/05/22 0802 12/05/22 1138  GLUCAP 156* 130* 203*    GERD -Continue pantoprazole   Hypomagnesemia/Hypokalemia -repleted  Discharge Diagnoses:  Principal Problem:   Influenza A Active Problems:   GERD (gastroesophageal reflux disease)   Sepsis, viral (HCC)   Lactic acidosis   Uncontrolled type 2 diabetes mellitus with hyperglycemia, without long-term current use of insulin (HCC)   Acute respiratory failure with hypoxia (HCC)   Atrial tachycardia   Acute systolic CHF (congestive heart failure), NYHA class 3 (HCC)   Sepsis due to Streptococcus pneumoniae ([place])   Bacteremia due to Streptococcus pneumoniae   Intractable vomiting   Discharge Instructions:  Allergies as of 12/05/2022   No Known Allergies      Medication List     STOP taking these medications    albuterol 108 (90 Base) MCG/ACT inhaler Commonly known as: VENTOLIN HFA   CORICIDIN HBP COLD/FLU PO       TAKE these medications    acetaminophen 500 MG tablet Commonly known as: TYLENOL Take 1,000 mg by mouth every 6 (six) hours as needed for fever.   cetirizine 10 MG tablet Commonly known as: ZYRTEC Take 10 mg by mouth daily.   diphenhydramine-acetaminophen 25-500 MG Tabs tablet Commonly  known as: TYLENOL PM Take 1 tablet by mouth at bedtime as needed (pain/sleep).   metFORMIN 500 MG tablet Commonly known as: GLUCOPHAGE Take 500 mg by mouth 2 (two) times daily.   metoprolol succinate 50 MG 24 hr tablet Commonly known as: TOPROL-XL Take 1 tablet (50 mg total) by mouth daily. Take with or immediately following a meal. Start taking on: December 06, 2022   multivitamin with minerals tablet Take 1 tablet by mouth daily.   omeprazole 20 MG capsule Commonly known as: PRILOSEC Take 20 mg by mouth daily.   sertraline 25 MG tablet Commonly known as: ZOLOFT Take 25 mg by mouth daily.               Durable Medical Equipment  (From admission, onward)           Start     Ordered   12/05/22 1215  For home use only DME Eelevated commode seat  Once        12/05/22 1214   12/05/22 1214  For home use only DME Shower stool  Once        12/05/22 1214   12/05/22 1214  For home use only DME Walker rolling  Once       Question Answer Comment  Walker: With [place] Wheels   Patient needs a walker to treat with the following condition Gait instability      12/05/22 1214            Follow-up Information  Finis Bud, NP Follow up.   Specialty: Cardiology Why: [facility] location - cardiology follow-up arranged for you on Friday Dec 18, 2022 at 1:00 PM (Arrive by 12:45 PM). Contact information: [place] [address] [place]. Schedule an appointment as soon as possible for a visit in 1 month(s).   Why: Hospital Follow Up Contact information: [address] [place] [phone]               No Known Allergies Allergies as of 12/05/2022   No Known Allergies      Medication List     STOP taking these medications    albuterol 108 (90 Base) MCG/ACT inhaler Commonly known as: VENTOLIN HFA   CORICIDIN HBP COLD/FLU PO        TAKE these medications    acetaminophen 500 MG tablet Commonly known as: TYLENOL Take 1,000 mg by mouth every 6 (six) hours as needed for fever.   cetirizine 10 MG tablet Commonly known as: ZYRTEC Take 10 mg by mouth daily.   diphenhydramine-acetaminophen 25-500 MG Tabs tablet Commonly known as: TYLENOL PM Take 1 tablet by mouth at bedtime as needed (pain/sleep).   metFORMIN 500 MG tablet Commonly known as: GLUCOPHAGE Take 500 mg by mouth 2 (two) times daily.   metoprolol succinate 50 MG 24 hr tablet Commonly known as: TOPROL-XL Take 1 tablet (50 mg total) by mouth daily. Take with or immediately following a meal. Start taking on: December 06, 2022   multivitamin with minerals tablet Take 1 tablet by mouth daily.   omeprazole 20 MG capsule Commonly known as: PRILOSEC Take 20 mg by mouth daily.   sertraline 25 MG tablet Commonly known as: ZOLOFT Take 25 mg by mouth daily.               Durable Medical Equipment  (From admission, onward)           Start     Ordered   12/05/22 1215  For home use only DME Eelevated commode seat  Once        12/05/22 1214   12/05/22 1214  For home use only DME Shower stool  Once        12/05/22 1214   12/05/22 1214  For home use only DME Walker rolling  Once       Question Answer Comment  Walker: With 5 Inch Wheels   Patient needs a walker to treat with the following condition Gait instability      12/05/22 1214            Procedures/Studies: DG ESOPHAGUS W SINGLE CM (SOL OR THIN BA)  Result Date: 12/01/2022 CLINICAL DATA:  Dysphagia.  Vomiting. EXAM: ESOPHOGRAM/BARIUM SWALLOW TECHNIQUE: Single contrast examination was performed using  thin barium . FLUOROSCOPY: Radiation Exposure Index (as provided by the fluoroscopic device): 20.1 mGy [place] COMPARISON:  None Available. FINDINGS: There is severe narrowing of the distal esophagus and gastroesophageal junction with bird beak appearance most consistent with achalasia.  Mild esophageal dilatation is noted. A small amount of contrast is seen passing through the gastroesophageal junction into the stomach. IMPRESSION: Severe narrowing of distal esophagus and gastroesophageal junction with bird's beak appearance most consistent with achalasia. Endoscopy is recommended for further evaluation. Electronically Signed   By: Marijo Conception M.D.   On: 12/01/2022 15:38   CT CHEST WO CONTRAST  Result Date:  11/28/2022 CLINICAL DATA:  Pneumonia, complication suspected.  Sepsis.  Fever. EXAM: CT CHEST WITHOUT CONTRAST TECHNIQUE: Multidetector CT imaging of the chest was performed following the standard protocol without IV contrast. RADIATION DOSE REDUCTION: This exam was performed according to the departmental dose-optimization program which includes automated exposure control, adjustment of the mA and/or kV according to patient size and/or use of iterative reconstruction technique. COMPARISON:  Chest radiograph November 25, 2022 and esophagram December 11, 2021 FINDINGS: Cardiovascular: Aortic atherosclerosis. Cardiac enlargement. Small pericardial effusion. Coronary artery calcifications. Mediastinum/Nodes: Prominent mediastinal lymph nodes and hilar nodal tissue not pathologically enlarged by size criteria and favored reactive. Fluid-filled patulous esophagus compatible with findings of achalasia seen on prior esophagram. Lungs/Pleura: Masslike consolidations in the bilateral lower lobes. Additional multifocal airspace opacities in the bilateral upper lobes and right middle lobe. Small bilateral pleural effusions. Upper Abdomen: No acute abnormality. Musculoskeletal: Remote left lateral rib fractures. Multilevel degenerative changes spine. Degenerative change of the bilateral glenohumeral joints. IMPRESSION: 1. Masslike consolidations in the bilateral lower lobes with additional multifocal airspace opacities in the bilateral upper lobes and right middle lobe, favored to reflect multifocal  pneumonia. Recommend follow-up imaging to ensure resolution. 2. Small bilateral pleural effusions. 3. Fluid-filled patulous esophagus compatible with findings of achalasia seen on prior esophagram. 4. Cardiac enlargement with small pericardial effusion, consider further evaluation with echocardiography. 5.  Aortic Atherosclerosis (ICD10-I70.0). Electronically Signed   By: Dahlia Bailiff M.D.   On: 11/28/2022 12:11   ECHOCARDIOGRAM COMPLETE  Result Date: 11/26/2022    ECHOCARDIOGRAM REPORT   Patient Name:   Henry [name] Date of Exam: 11/26/2022 Medical Rec #:  [id number]    Height:       71.0 in Accession #:    [id number]   Weight:       200.6 lb Date of Birth:  1951/04/06    BSA:          2.111 m Patient Age:    51 years     BP:           103/64 mmHg Patient Gender: M            HR:           102 bpm. Exam Location:  [facility] Procedure: 2D Echo, Cardiac Doppler and Color Doppler Indications:    CHF-Acute Diastolic X93.71  History:        Patient has no prior history of Echocardiogram examinations.                 Risk Factors:Diabetes. Acute respiratory failure with hypoxia                 and Positive for FLU A at present, Mental disability (From Hx).  Sonographer:    Alvino Chapel RCS Referring Phys: [id number] DAVID TAT  Sonographer Comments: Patient could NOT follow instructions to "sniff". IMPRESSIONS  1. Global hypokinesis worse in the inferior base . Left ventricular ejection fraction, by estimation, is 25 to 30%. The left ventricle has severely decreased function. The left ventricle demonstrates global hypokinesis. The left ventricular internal cavity size was moderately dilated. Left ventricular diastolic parameters are indeterminate.  2. Some septal flattening suggesting elevated PA pressure TR velocity inadequate to estimate . Right ventricular systolic function is mildly reduced. The right ventricular size is mildly enlarged.  3. Left atrial size was moderately dilated.  4. The mitral valve is normal in  structure. Trivial mitral valve regurgitation. No evidence of mitral stenosis.  5. The  aortic valve is tricuspid. Aortic valve regurgitation is not visualized. No aortic stenosis is present.  6. The inferior vena cava is dilated in size with >50% respiratory variability, suggesting right atrial pressure of 8 mmHg. FINDINGS  Left Ventricle: Global hypokinesis worse in the inferior base. Left ventricular ejection fraction, by estimation, is 25 to 30%. The left ventricle has severely decreased function. The left ventricle demonstrates global hypokinesis. Definity contrast agent was given IV to delineate the left ventricular endocardial borders. The left ventricular internal cavity size was moderately dilated. There is no left ventricular hypertrophy. Left ventricular diastolic parameters are indeterminate. Right Ventricle: Some septal flattening suggesting elevated PA pressure TR velocity inadequate to estimate. The right ventricular size is mildly enlarged. No increase in right ventricular wall thickness. Right ventricular systolic function is mildly reduced. Left Atrium: Left atrial size was moderately dilated. Right Atrium: Right atrial size was normal in size. Pericardium: There is no evidence of pericardial effusion. Mitral Valve: The mitral valve is normal in structure. Trivial mitral valve regurgitation. No evidence of mitral valve stenosis. Tricuspid Valve: The tricuspid valve is normal in structure. Tricuspid valve regurgitation is mild . No evidence of tricuspid stenosis. Aortic Valve: The aortic valve is tricuspid. Aortic valve regurgitation is not visualized. No aortic stenosis is present. Pulmonic Valve: The pulmonic valve was normal in structure. Pulmonic valve regurgitation is trivial. No evidence of pulmonic stenosis. Aorta: The aortic root is normal in size and structure. Venous: The inferior vena cava is dilated in size with greater than 50% respiratory variability, suggesting right atrial pressure of  8 mmHg. IAS/Shunts: No atrial level shunt detected by color flow Doppler.  LEFT VENTRICLE PLAX 2D LVIDd:         5.60 cm      Diastology LVIDs:         4.60 cm      LV e' medial:    6.64 cm/s LV PW:         1.00 cm      LV E/e' medial:  12.2 LV IVS:        1.10 cm      LV e' lateral:   8.27 cm/s LVOT diam:     2.00 cm      LV E/e' lateral: 9.8 LV SV:         52 LV SV Index:   25 LVOT Area:     3.14 cm  LV Volumes (MOD) LV vol d, MOD A2C: 116.0 ml LV vol d, MOD A4C: 126.0 ml LV vol s, MOD A2C: 92.4 ml LV vol s, MOD A4C: 81.5 ml LV SV MOD A2C:     23.6 ml LV SV MOD A4C:     126.0 ml LV SV MOD BP:      37.7 ml RIGHT VENTRICLE RV S prime:     12.65 cm/s TAPSE (M-mode): 1.5 cm LEFT ATRIUM             Index        RIGHT ATRIUM           Index LA diam:        4.40 cm 2.08 cm/m   RA Area:     24.70 cm LA Vol (A2C):   70.4 ml 33.34 ml/m  RA Volume:   82.00 ml  38.84 ml/m LA Vol (A4C):   65.2 ml 30.88 ml/m LA Biplane Vol: 74.5 ml 35.29 ml/m  AORTIC VALVE LVOT Vmax:   97.60 cm/s LVOT Vmean:  74.900  cm/s LVOT VTI:    0.166 m  AORTA Ao Root diam: 3.50 cm MITRAL VALVE               TRICUSPID VALVE MV Area (PHT): 4.94 cm    TR Peak grad:   40.7 mmHg MV Decel Time: 154 msec    TR Vmax:        319.00 cm/s MV E velocity: 80.70 cm/s MV A velocity: 53.30 cm/s  SHUNTS MV E/A ratio:  1.51        Systemic VTI:  0.17 m                            Systemic Diam: 2.00 cm Jenkins Rouge MD Electronically signed by Jenkins Rouge MD Signature Date/Time: 11/26/2022/4:51:55 PM    Final    DG Chest Port 1 View  Result Date: 11/25/2022 CLINICAL DATA:  Shortness of breath. EXAM: PORTABLE CHEST 1 VIEW COMPARISON:  Chest radiograph dated 04/15/2022. FINDINGS: Bilateral mid to lower lung field confluent nodular densities most consistent with developing infiltrate. Trace left pleural effusion suspected. No pneumothorax. Stable cardiomegaly. No acute osseous pathology. IMPRESSION: Bilateral mid to lower lung field developing infiltrate.  Electronically Signed   By: Anner Crete M.D.   On: 11/25/2022 19:19     Subjective: Pt says he feels well, he is tolerating diet and he is adamant about going home today  Discharge Exam: Vitals:   12/04/22 2054 12/05/22 0416  BP: 113/87 128/80  Pulse: 75 71  Resp: 18 19  Temp: 98.6 F (37 C) 97.6 F (36.4 C)  SpO2: 97% 97%   Vitals:   12/04/22 1351 12/04/22 1400 12/04/22 2054 12/05/22 0416  BP: 121/69  113/87 128/80  Pulse: 83  75 71  Resp: (!) 36 '20 18 19  '$ Temp: 97.7 F (36.5 C)  98.6 F (37 C) 97.6 F (36.4 C)  TempSrc: Oral  Oral   SpO2: 96%  97% 97%  Weight:    91.2 kg  Height:       General: Pt is alert, awake, not in acute distress Cardiovascular: normal S1/S2 +, no rubs, no gallops Respiratory: CTA bilaterally, no wheezing, no rhonchi Abdominal: Soft, NT, ND, bowel sounds + Extremities: 1+ edema, no cyanosis   The results of significant diagnostics from this hospitalization (including imaging, microbiology, ancillary and laboratory) are listed below for reference.     Microbiology: Recent Results (from the past 240 hour(s))  Culture, blood (Routine x 2)     Status: Abnormal   Collection Time: 11/25/22  7:52 PM   Specimen: BLOOD  Result Value Ref Range Status   Specimen Description   Final    BLOOD LEFT ANTECUBITAL Performed at [facility], [address] [place], [place] [address]    Special Requests   Final    BOTTLES DRAWN AEROBIC AND ANAEROBIC Blood Culture adequate volume Performed at [place] [place], [place] [address]    Culture  Setup Time   Final    GRAM POSITIVE COCCI BOTTLES DRAWN AEROBIC AND ANAEROBIC Gram Stain Report Called to,Read Back By and Verified With: BASS,V '@1000'$  BY MATTHEWS, B 12.28.2023 CRITICAL RESULT CALLED TO, READ BACK BY AND VERIFIED WITHMaralyn Sago [facility] [id number] JRS Performed at [facility], [place] [address] [place], [place] [address]    Culture STREPTOCOCCUS PNEUMONIAE (A)  Final    Report Status 11/28/2022 FINAL  Final   Organism ID, Bacteria  STREPTOCOCCUS PNEUMONIAE  Final      Susceptibility   Streptococcus pneumoniae - MIC*    ERYTHROMYCIN <=0.12 SENSITIVE Sensitive     LEVOFLOXACIN 0.5 SENSITIVE Sensitive     VANCOMYCIN <=0.12 SENSITIVE Sensitive     PENO - penicillin <=0.06      PENICILLIN (non-meningitis) <=0.06 SENSITIVE Sensitive     PENICILLIN (oral) <=0.06 SENSITIVE Sensitive     CEFTRIAXONE (meningitis) <=0.12 SENSITIVE Sensitive     * STREPTOCOCCUS PNEUMONIAE  Blood Culture ID Panel (Reflexed)     Status: Abnormal   Collection Time: 11/25/22  7:52 PM  Result Value Ref Range Status   Enterococcus faecalis NOT DETECTED NOT DETECTED Final   Enterococcus Faecium NOT DETECTED NOT DETECTED Final   Listeria monocytogenes NOT DETECTED NOT DETECTED Final   Staphylococcus species NOT DETECTED NOT DETECTED Final   Staphylococcus aureus (BCID) NOT DETECTED NOT DETECTED Final   Staphylococcus epidermidis NOT DETECTED NOT DETECTED Final   Staphylococcus lugdunensis NOT DETECTED NOT DETECTED Final   Streptococcus species DETECTED (A) NOT DETECTED Final    Comment: PHARMD STEVEN [place] [id number] JRS   Streptococcus agalactiae NOT DETECTED NOT DETECTED Final   Streptococcus pneumoniae DETECTED (A) NOT DETECTED Final    Comment: PHARMD STEVEN [place] [id number] JRS   Streptococcus pyogenes NOT DETECTED NOT DETECTED Final   A.calcoaceticus-baumannii NOT DETECTED NOT DETECTED Final   Bacteroides fragilis NOT DETECTED NOT DETECTED Final   Enterobacterales NOT DETECTED NOT DETECTED Final   Enterobacter cloacae complex NOT DETECTED NOT DETECTED Final   Escherichia coli NOT DETECTED NOT DETECTED Final   Klebsiella aerogenes NOT DETECTED NOT DETECTED Final   Klebsiella oxytoca NOT DETECTED NOT DETECTED Final   Klebsiella pneumoniae NOT DETECTED NOT DETECTED Final   Proteus species NOT DETECTED NOT DETECTED Final   Salmonella species NOT DETECTED NOT DETECTED Final    Serratia marcescens NOT DETECTED NOT DETECTED Final   Haemophilus influenzae NOT DETECTED NOT DETECTED Final   Neisseria meningitidis NOT DETECTED NOT DETECTED Final   Pseudomonas aeruginosa NOT DETECTED NOT DETECTED Final   Stenotrophomonas maltophilia NOT DETECTED NOT DETECTED Final   Candida albicans NOT DETECTED NOT DETECTED Final   Candida auris NOT DETECTED NOT DETECTED Final   Candida glabrata NOT DETECTED NOT DETECTED Final   Candida krusei NOT DETECTED NOT DETECTED Final   Candida parapsilosis NOT DETECTED NOT DETECTED Final   Candida tropicalis NOT DETECTED NOT DETECTED Final   Cryptococcus neoformans/gattii NOT DETECTED NOT DETECTED Final    Comment: Performed at [facility], [address] [place], [place] [address]  Culture, blood (Routine x 2)     Status: Abnormal   Collection Time: 11/25/22  7:53 PM   Specimen: BLOOD  Result Value Ref Range Status   Specimen Description   Final    BLOOD RIGHT ANTECUBITAL Performed at [facility], [address], [place], [place] [address]    Special Requests   Final    BOTTLES DRAWN AEROBIC AND ANAEROBIC Blood Culture adequate volume Performed at [place] [place],  [address]    Culture  Setup Time   Final    GRAM POSITIVE COCCI BOTTLES DRAWN AEROBIC AND ANAEROBIC Gram Stain Report Called to,Read Back By and Verified With: BASS,V'@1000'$  BY MATTHEWS,B 12.28.2023 IN BOTH AEROBIC AND ANAEROBIC BOTTLES    Culture (A)  Final    STREPTOCOCCUS PNEUMONIAE SUSCEPTIBILITIES PERFORMED ON PREVIOUS CULTURE WITHIN THE LAST 5 DAYS. Performed at [facility], [place]  [address] [place], [place] [address]    Report Status 11/28/2022 FINAL  Final  Resp panel by RT-PCR (RSV, Flu A&B, Covid) Anterior Nasal Swab     Status: Abnormal   Collection Time: 11/25/22  8:05 PM   Specimen: Anterior Nasal Swab  Result Value Ref Range Status   SARS Coronavirus 2 by RT PCR NEGATIVE NEGATIVE Final    Comment: (NOTE) SARS-CoV-2  target nucleic acids are NOT DETECTED.  The SARS-CoV-2 RNA is generally detectable in upper respiratory specimens during the acute phase of infection. The lowest concentration of SARS-CoV-2 viral copies this assay can detect is 138 copies/mL. A negative result does not preclude SARS-Cov-2 infection and should not be used as the sole basis for treatment or other patient management decisions. A negative result may occur with  improper specimen collection/handling, submission of specimen other than nasopharyngeal swab, presence of viral mutation(s) within the areas targeted by this assay, and inadequate number of viral copies(<138 copies/mL). A negative result must be combined with clinical observations, patient history, and epidemiological information. The expected result is Negative.  Fact Sheet for Patients:  EntrepreneurPulse.com.au  Fact Sheet for Healthcare Providers:  IncredibleEmployment.be  This test is no t yet approved or cleared by the [place] FDA and  has been authorized for detection and/or diagnosis of SARS-CoV-2 by FDA under an Emergency Use Authorization (EUA). This EUA will remain  in effect (meaning this test can be used) for the duration of the COVID-19 declaration under Section 564(b)(1) of the Act, 21 U.S.C.section 360bbb-3(b)(1), unless the authorization is terminated  or revoked sooner.       Influenza A by PCR POSITIVE (A) NEGATIVE Final   Influenza B by PCR NEGATIVE NEGATIVE Final    Comment: (NOTE) The Xpert Xpress SARS-CoV-2/FLU/RSV plus assay is intended as an aid in the diagnosis of influenza from Nasopharyngeal swab specimens and should not be used as a sole basis for treatment. Nasal washings and aspirates are unacceptable for Xpert Xpress SARS-CoV-2/FLU/RSV testing.  Fact Sheet for Patients: EntrepreneurPulse.com.au  Fact Sheet for Healthcare  Providers: IncredibleEmployment.be  This test is not yet approved or cleared by the [place] FDA and has been authorized for detection and/or diagnosis of SARS-CoV-2 by FDA under an Emergency Use Authorization (EUA). This EUA will remain in effect (meaning this test can be used) for the duration of the COVID-19 declaration under Section 564(b)(1) of the Act, 21 U.S.C. section 360bbb-3(b)(1), unless the authorization is terminated or revoked.     Resp Syncytial Virus by PCR NEGATIVE NEGATIVE Final    Comment: (NOTE) Fact Sheet for Patients: EntrepreneurPulse.com.au  Fact Sheet for Healthcare Providers: IncredibleEmployment.be  This test is not yet approved or cleared by the [place] FDA and has been authorized for detection and/or diagnosis of SARS-CoV-2 by FDA under an Emergency Use Authorization (EUA). This EUA will remain in effect (meaning this test can be used) for the duration of the COVID-19 declaration under Section 564(b)(1) of the Act, 21 U.S.C. section 360bbb-3(b)(1), unless the authorization is terminated or revoked.  Performed at [facility], [address], [place], [place] [address]   MRSA Next Gen by PCR, Nasal     Status: None   Collection Time: 11/27/22 11:56 AM   Specimen: Nasal Mucosa; Nasal Swab  Result Value Ref Range Status   MRSA by PCR Next Gen NOT DETECTED NOT DETECTED Final    Comment: (NOTE) The GeneXpert MRSA Assay (FDA approved for NASAL specimens only), is one component of a comprehensive MRSA  colonization surveillance program. It is not intended to diagnose MRSA infection nor to guide or monitor treatment for MRSA infections. Test performance is not FDA approved in patients less than 52 years old. Performed at [facility], [address], [place], [place] [address]      Labs: BNP (last 3 results) Recent Labs    11/26/22 0450 11/28/22 0746 12/03/22 0326  BNP 2,649.0* 809.0*  413.2*   Basic Metabolic Panel: Recent Labs  Lab 11/29/22 0400 11/30/22 0617 12/01/22 0407 12/02/22 0404 12/03/22 0326 12/04/22 0305  NA 139 139 139 137 137 137  K 3.4* 3.4* 3.2* 3.2* 3.7 3.5  CL 101 100 98 94* 96* 98  CO2 '29 28 31 31 31 29  '$ GLUCOSE 144* 169* 161* 187* 148* 150*  BUN 24* '19 15 15 14 13  '$ CREATININE 0.66 0.61 0.53* 0.53* 0.60* 0.58*  CALCIUM 7.5* 7.6* 7.4* 7.6* 7.7* 7.6*  MG 1.9 2.0 1.8 1.9 2.0 2.0  PHOS 3.2  --   --   --   --   --    Liver Function Tests: No results for input(s): "AST", "ALT", "ALKPHOS", "BILITOT", "PROT", "ALBUMIN" in the last 168 hours. No results for input(s): "LIPASE", "AMYLASE" in the last 168 hours. No results for input(s): "AMMONIA" in the last 168 hours. CBC: Recent Labs  Lab 11/29/22 0400 11/30/22 0617  WBC 11.4* 7.7  HGB 11.5* 11.5*  HCT 33.9* 34.3*  MCV 86.5 85.1  PLT 159 159   Cardiac Enzymes: No results for input(s): "CKTOTAL", "CKMB", "CKMBINDEX", "TROPONINI" in the last 168 hours. BNP: Invalid input(s): "POCBNP" CBG: Recent Labs  Lab 12/04/22 1115 12/04/22 1633 12/04/22 2053 12/05/22 0802 12/05/22 1138  GLUCAP 202* 201* 156* 130* 203*   D-Dimer No results for input(s): "DDIMER" in the last 72 hours. Hgb A1c No results for input(s): "HGBA1C" in the last 72 hours. Lipid Profile No results for input(s): "CHOL", "HDL", "LDLCALC", "TRIG", "CHOLHDL", "LDLDIRECT" in the last 72 hours. Thyroid function studies Recent Labs    12/04/22 1013  TSH 0.473   Anemia work up No results for input(s): "VITAMINB12", "FOLATE", "FERRITIN", "TIBC", "IRON", "RETICCTPCT" in the last 72 hours. Urinalysis    Component Value Date/Time   COLORURINE AMBER (A) 11/26/2022 0108   APPEARANCEUR HAZY (A) 11/26/2022 0108   LABSPEC 1.025 11/26/2022 0108   PHURINE 5.0 11/26/2022 0108   GLUCOSEU 50 (A) 11/26/2022 0108   HGBUR NEGATIVE 11/26/2022 0108   BILIRUBINUR NEGATIVE 11/26/2022 0108   KETONESUR 5 (A) 11/26/2022 0108   PROTEINUR  >=300 (A) 11/26/2022 0108   NITRITE NEGATIVE 11/26/2022 0108   LEUKOCYTESUR NEGATIVE 11/26/2022 0108   Sepsis Labs Recent Labs  Lab 11/29/22 0400 11/30/22 0617  WBC 11.4* 7.7   Microbiology Recent Results (from the past 240 hour(s))  Culture, blood (Routine x 2)     Status: Abnormal   Collection Time: 11/25/22  7:52 PM   Specimen: BLOOD  Result Value Ref Range Status   Specimen Description   Final    BLOOD LEFT ANTECUBITAL Performed at [facility], [address], [place], [place] [address]    Special Requests   Final    BOTTLES DRAWN AEROBIC AND ANAEROBIC Blood Culture adequate volume Performed at [facility], [address], [place], [place] [address]    Culture  Setup Time   Final    GRAM POSITIVE COCCI BOTTLES DRAWN AEROBIC AND ANAEROBIC Gram Stain Report Called to,Read Back By and Verified With: BASS,V '@1000'$  BY MATTHEWS, B 12.28.2023 CRITICAL RESULT CALLED  TO, READ BACK BY AND VERIFIED WITH: Maralyn Sago [facility] [id number] JRS Performed at [facility], [place] [address], [place], [place] [address]    Culture STREPTOCOCCUS PNEUMONIAE (A)  Final   Report Status 11/28/2022 FINAL  Final   Organism ID, Bacteria STREPTOCOCCUS PNEUMONIAE  Final      Susceptibility   Streptococcus pneumoniae - MIC*    ERYTHROMYCIN <=0.12 SENSITIVE Sensitive     LEVOFLOXACIN 0.5 SENSITIVE Sensitive     VANCOMYCIN <=0.12 SENSITIVE Sensitive     PENO - penicillin <=0.06      PENICILLIN (non-meningitis) <=0.06 SENSITIVE Sensitive     PENICILLIN (oral) <=0.06 SENSITIVE Sensitive     CEFTRIAXONE (meningitis) <=0.12 SENSITIVE Sensitive     * STREPTOCOCCUS PNEUMONIAE  Blood Culture ID Panel (Reflexed)     Status: Abnormal   Collection Time: 11/25/22  7:52 PM  Result Value Ref Range Status   Enterococcus faecalis NOT DETECTED NOT DETECTED Final   Enterococcus Faecium NOT DETECTED NOT DETECTED Final   Listeria monocytogenes NOT DETECTED NOT DETECTED Final   Staphylococcus species NOT  DETECTED NOT DETECTED Final   Staphylococcus aureus (BCID) NOT DETECTED NOT DETECTED Final   Staphylococcus epidermidis NOT DETECTED NOT DETECTED Final   Staphylococcus lugdunensis NOT DETECTED NOT DETECTED Final   Streptococcus species DETECTED (A) NOT DETECTED Final    Comment: PHARMD STEVEN HURSH [id number] JRS   Streptococcus agalactiae NOT DETECTED NOT DETECTED Final   Streptococcus pneumoniae DETECTED (A) NOT DETECTED Final    Comment: PHARMD STEVEN [place] [id number] JRS   Streptococcus pyogenes NOT DETECTED NOT DETECTED Final   A.calcoaceticus-baumannii NOT DETECTED NOT DETECTED Final   Bacteroides fragilis NOT DETECTED NOT DETECTED Final   Enterobacterales NOT DETECTED NOT DETECTED Final   Enterobacter cloacae complex NOT DETECTED NOT DETECTED Final   Escherichia coli NOT DETECTED NOT DETECTED Final   Klebsiella aerogenes NOT DETECTED NOT DETECTED Final   Klebsiella oxytoca NOT DETECTED NOT DETECTED Final   Klebsiella pneumoniae NOT DETECTED NOT DETECTED Final   Proteus species NOT DETECTED NOT DETECTED Final   Salmonella species NOT DETECTED NOT DETECTED Final   Serratia marcescens NOT DETECTED NOT DETECTED Final   Haemophilus influenzae NOT DETECTED NOT DETECTED Final   Neisseria meningitidis NOT DETECTED NOT DETECTED Final   Pseudomonas aeruginosa NOT DETECTED NOT DETECTED Final   Stenotrophomonas maltophilia NOT DETECTED NOT DETECTED Final   Candida albicans NOT DETECTED NOT DETECTED Final   Candida auris NOT DETECTED NOT DETECTED Final   Candida glabrata NOT DETECTED NOT DETECTED Final   Candida krusei NOT DETECTED NOT DETECTED Final   Candida parapsilosis NOT DETECTED NOT DETECTED Final   Candida tropicalis NOT DETECTED NOT DETECTED Final   Cryptococcus neoformans/gattii NOT DETECTED NOT DETECTED Final    Comment: Performed at [facility], [address], [place], [place] [address]  Culture, blood (Routine x 2)     Status: Abnormal   Collection Time: 11/25/22   7:53 PM   Specimen: BLOOD  Result Value Ref Range Status   Specimen Description   Final    BLOOD RIGHT ANTECUBITAL Performed at [facility], [address], [place], [place] [address]    Special Requests   Final    BOTTLES DRAWN AEROBIC AND ANAEROBIC Blood Culture adequate volume Performed at [facility], [address] [place], [place] [address]    Culture  Setup Time   Final    GRAM POSITIVE COCCI BOTTLES DRAWN AEROBIC AND ANAEROBIC Gram Stain  Report Called to,Read Back By and Verified With: BASS,V'@1000'$  BY MATTHEWS,B 12.28.2023 IN BOTH AEROBIC AND ANAEROBIC BOTTLES    Culture (A)  Final    STREPTOCOCCUS PNEUMONIAE SUSCEPTIBILITIES PERFORMED ON PREVIOUS CULTURE WITHIN THE LAST 5 DAYS. Performed at [facility], [place] [address] [place], [place] [address]    Report Status 11/28/2022 FINAL  Final  Resp panel by RT-PCR (RSV, Flu A&B, Covid) Anterior Nasal Swab     Status: Abnormal   Collection Time: 11/25/22  8:05 PM   Specimen: Anterior Nasal Swab  Result Value Ref Range Status   SARS Coronavirus 2 by RT PCR NEGATIVE NEGATIVE Final    Comment: (NOTE) SARS-CoV-2 target nucleic acids are NOT DETECTED.  The SARS-CoV-2 RNA is generally detectable in upper respiratory specimens during the acute phase of infection. The lowest concentration of SARS-CoV-2 viral copies this assay can detect is 138 copies/mL. A negative result does not preclude SARS-Cov-2 infection and should not be used as the sole basis for treatment or other patient management decisions. A negative result may occur with  improper specimen collection/handling, submission of specimen other than nasopharyngeal swab, presence of viral mutation(s) within the areas targeted by this assay, and inadequate number of viral copies(<138 copies/mL). A negative result must be combined with clinical observations, patient history, and epidemiological information. The expected result is Negative.  Fact Sheet for Patients:   EntrepreneurPulse.com.au  Fact Sheet for Healthcare Providers:  IncredibleEmployment.be  This test is no t yet approved or cleared by the [place] FDA and  has been authorized for detection and/or diagnosis of SARS-CoV-2 by FDA under an Emergency Use Authorization (EUA). This EUA will remain  in effect (meaning this test can be used) for the duration of the COVID-19 declaration under Section 564(b)(1) of the Act, 21 U.S.C.section 360bbb-3(b)(1), unless the authorization is terminated  or revoked sooner.       Influenza A by PCR POSITIVE (A) NEGATIVE Final   Influenza B by PCR NEGATIVE NEGATIVE Final    Comment: (NOTE) The Xpert Xpress SARS-CoV-2/FLU/RSV plus assay is intended as an aid in the diagnosis of influenza from Nasopharyngeal swab specimens and should not be used as a sole basis for treatment. Nasal washings and aspirates are unacceptable for Xpert Xpress SARS-CoV-2/FLU/RSV testing.  Fact Sheet for Patients: EntrepreneurPulse.com.au  Fact Sheet for Healthcare Providers: IncredibleEmployment.be  This test is not yet approved or cleared by the [place] FDA and has been authorized for detection and/or diagnosis of SARS-CoV-2 by FDA under an Emergency Use Authorization (EUA). This EUA will remain in effect (meaning this test can be used) for the duration of the COVID-19 declaration under Section 564(b)(1) of the Act, 21 U.S.C. section 360bbb-3(b)(1), unless the authorization is terminated or revoked.     Resp Syncytial Virus by PCR NEGATIVE NEGATIVE Final    Comment: (NOTE) Fact Sheet for Patients: EntrepreneurPulse.com.au  Fact Sheet for Healthcare Providers: IncredibleEmployment.be  This test is not yet approved or cleared by the [place] FDA and has been authorized for detection and/or diagnosis of SARS-CoV-2 by FDA under an Emergency Use  Authorization (EUA). This EUA will remain in effect (meaning this test can be used) for the duration of the COVID-19 declaration under Section 564(b)(1) of the Act, 21 U.S.C. section 360bbb-3(b)(1), unless the authorization is terminated or revoked.  Performed at [facility], [address], [place],  [address]   MRSA Next Gen by PCR, Nasal     Status: None   Collection Time: 11/27/22  11:56 AM   Specimen: Nasal Mucosa; Nasal Swab  Result Value Ref Range Status   MRSA by PCR Next Gen NOT DETECTED NOT DETECTED Final    Comment: (NOTE) The GeneXpert MRSA Assay (FDA approved for NASAL specimens only), is one component of a comprehensive MRSA colonization surveillance program. It is not intended to diagnose MRSA infection nor to guide or monitor treatment for MRSA infections. Test performance is not FDA approved in patients less than 36 years old. Performed at [facility], [address], [place], [place] [address]     Time coordinating discharge: 43 mins   SIGNED:  Irwin Brakeman, MD  [facility] 12/05/2022, 12:20 PM How to contact the [facility] Attending or Consulting provider [place] or covering provider during after hours [place], for this patient?  Check the care team in [facility] and look for a) attending/consulting [facility] provider listed and b) the [facility] team listed Log into [email or web address] and use [place]'s universal password to access. If you do not have the password, please contact the hospital operator. Locate the [facility] provider you are looking for under [facility] and page to a number that you can be directly reached. If you still have difficulty reaching the provider, please page the [facility] (Director on Call) for the Hospitalists listed on amion for assistance.

## 2022-12-05 NOTE — TOC Transition Note (Signed)
Transition of Care [facility]) - CM/SW Discharge Note   Patient Details  Name: Henry [name] MRN: [id number] Date of Birth: [birth year]  Transition of Care [facility]) CM/SW Contact:  Boneta Lucks, RN Phone Number: 12/05/2022, 12:39 PM   Clinical Narrative:   Patient discharging home. MD ordering DME. Referral sent to [facility] with [employer], they will ship. MD order HHRN/PT. Georgina Snell with [facility] accepted the referral.  CM added to AVS and left his sister a message explaining.    Final next level of care: Home w Home Health Services Barriers to Discharge: Barriers Resolved   Patient Goals and CMS Choice CMS Medicare.gov Compare Post Acute Care list provided to:: Patient    Discharge Placement                 Name of family member notified: Left daughter a message Patient and family notified of of transfer: 12/05/22  Discharge Plan and Services Additional resources added to the After Visit Summary for                   DME Agency: [place] Date DME Agency Contacted: 12/05/22 Time DME Agency Contacted: [phone] Representative spoke with at DME Agency: Erasmo Downer HH Arranged: PT, RN [facility] Agency: [place] Date [place]: 12/05/22 Time [place]: [phone] Representative spoke with at [place]: [place] ([place]) Interventions [place]: No Food Insecurity (11/27/2022)  Housing: Low Risk  (11/27/2022)  Transportation Needs: No Transportation Needs (11/27/2022)  Utilities: Not At Risk (11/27/2022)  Tobacco Use: Medium Risk (12/03/2022)    Readmission Risk Interventions    12/05/2022   12:39 PM  Readmission Risk Prevention Plan  Transportation Screening Complete  PCP or Specialist Appt within 5-7 Days Complete  Home Care Screening Complete  Medication Review (RN CM) Complete

## 2022-12-05 NOTE — Discharge Instructions (Addendum)
LOW SODIUM RECOMMENDED IN DIET LESS THAN 2 GM DAILY FULL LIQUID DIET ONLY  FOLLOW UP WITH CARDIOLOGY AND GI   IMPORTANT INFORMATION: PAY CLOSE ATTENTION   PHYSICIAN DISCHARGE INSTRUCTIONS  Follow with Primary care provider  [place], Virgina Evener, MD  and other consultants as instructed by your Hospitalist Physician  [place] IF SYMPTOMS COME BACK, WORSEN OR NEW PROBLEM DEVELOPS   Please note: You were cared for by a hospitalist during your hospital stay. Every effort will be made to forward records to your primary care provider.  You can request that your primary care provider send for your hospital records if they have not received them.  Once you are discharged, your primary care physician will handle any further medical issues. Please note that NO REFILLS for any discharge medications will be authorized once you are discharged, as it is imperative that you return to your primary care physician (or establish a relationship with a primary care physician if you do not have one) for your post hospital discharge needs so that they can reassess your need for medications and monitor your lab values.  Please get a complete blood count and chemistry panel checked by your Primary MD at your next visit, and again as instructed by your Primary MD.  Get Medicines reviewed and adjusted: Please take all your medications with you for your next visit with your Primary MD  Laboratory/radiological data: Please request your Primary MD to go over all hospital tests and procedure/radiological results at the follow up, please ask your primary care provider to get all Hospital records sent to his/her office.  In some cases, they will be blood work, cultures and biopsy results pending at the time of your discharge. Please request that your primary care provider follow up on these results.  If you are diabetic, please bring your blood sugar readings with you to your follow up  appointment with primary care.    Please call and make your follow up appointments as soon as possible.    Also Note the following: If you experience worsening of your admission symptoms, develop shortness of breath, life threatening emergency, suicidal or homicidal thoughts you must seek medical attention immediately by calling 911 or calling your MD immediately  if symptoms less severe.  You must read complete instructions/literature along with all the possible adverse reactions/side effects for all the Medicines you take and that have been prescribed to you. Take any new Medicines after you have completely understood and accpet all the possible adverse reactions/side effects.   Do not drive when taking Pain medications or sleeping medications (Benzodiazepines)  Do not take more than prescribed Pain, Sleep and Anxiety Medications. It is not advisable to combine anxiety,sleep and pain medications without talking with your primary care practitioner  Special Instructions: If you have smoked or chewed Tobacco  in the last 2 yrs please stop smoking, stop any regular Alcohol  and or any Recreational drug use.  Wear Seat belts while driving.  Do not drive if taking any narcotic, mind altering or controlled substances or recreational drugs or alcohol.

## 2022-12-09 ENCOUNTER — Encounter (HOSPITAL_COMMUNITY): Payer: Self-pay | Admitting: Internal Medicine

## 2022-12-18 ENCOUNTER — Ambulatory Visit: Payer: Medicare Other | Admitting: Nurse Practitioner

## 2022-12-31 DEATH — deceased

## 2023-02-10 IMAGING — RF DG ESOPHAGUS
11 series · 14 of 24 positions shown · non-contrast
Comparison: December 11, 2021.

CLINICAL DATA: Dysphagia.

EXAM:
ESOPHOGRAM/BARIUM SWALLOW
TECHNIQUE: Single contrast examination was performed using  thin barium.
FLUOROSCOPY:
Radiation Exposure Index (as provided by the fluoroscopic device):
71.7 mGy mGy Kerma

[Series 1: cp_standard · 0.26mm/px · 1 of 112 frames shown (1 of 11)]
[frame 2/112]
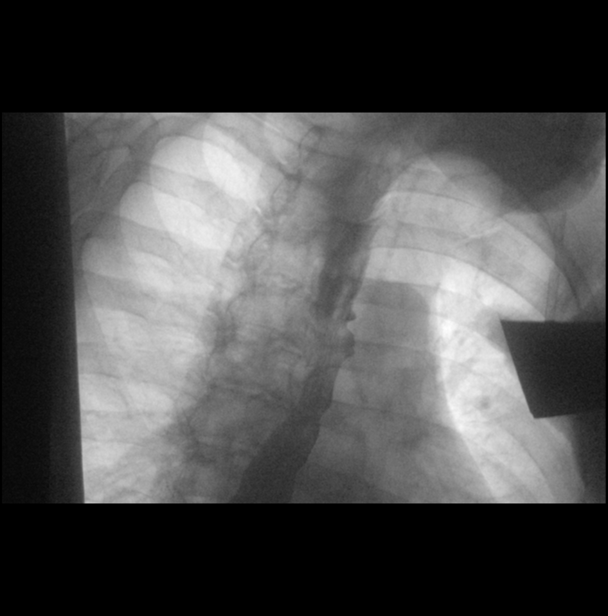

[Series 2: cp_standard · 0.26mm/px · 2 of 151 frames shown (2 of 11)]
[frame 23/151]
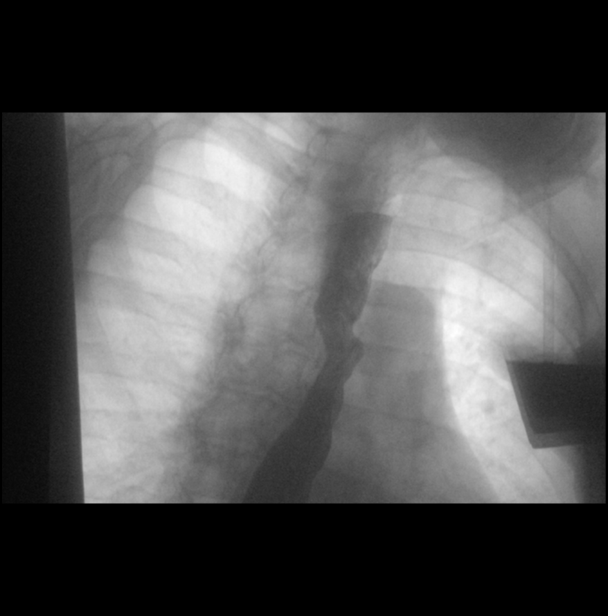
[frame 129/151]
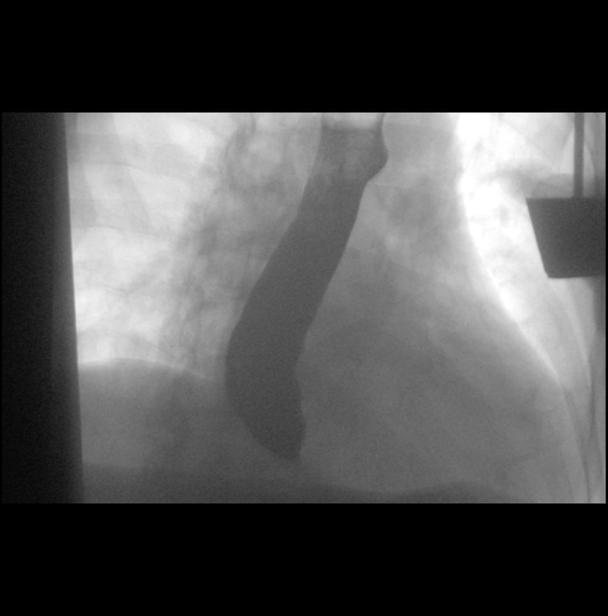

[Series 3: cp_standard · 0.26mm/px · 1 of 49 frames shown (3 of 11)]
[frame 42/49]
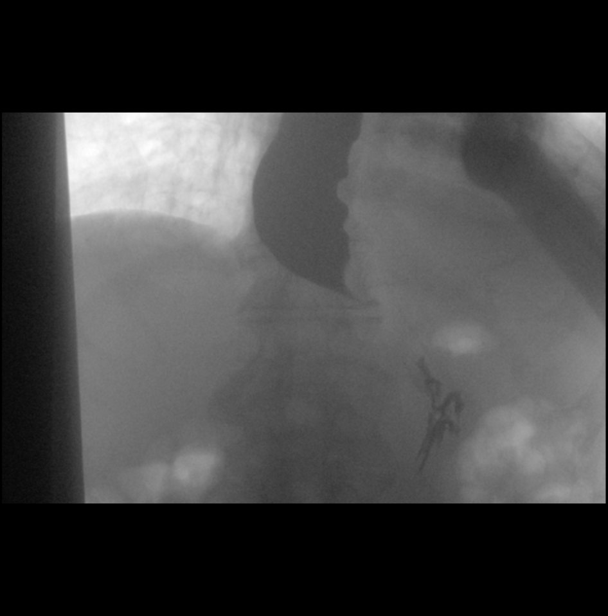

[Series 4: cp_standard · 0.26mm/px · 1 of 133 frames shown (4 of 11)]
[frame 43/133]
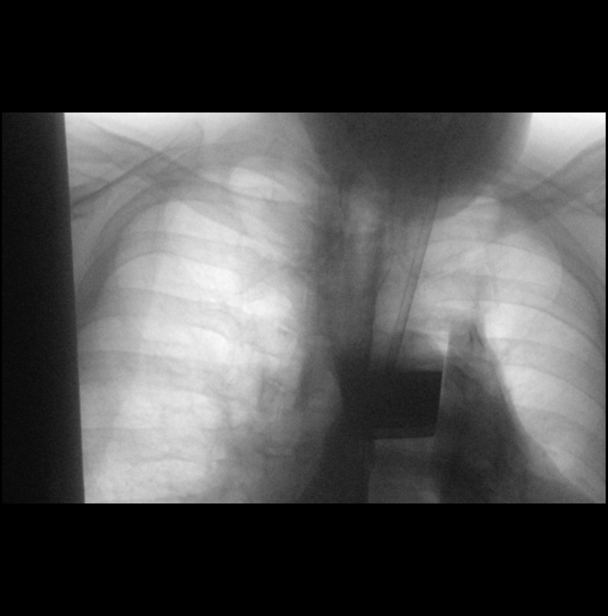

[Series 5: cp_standard · 0.26mm/px · 1 of 56 frames shown (5 of 11)]
[frame 9/56]
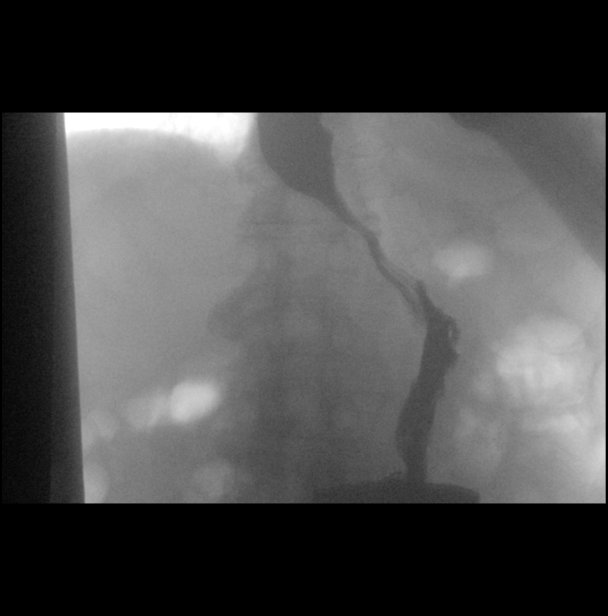

[Series 6: cp_standard · 0.26mm/px · 2 of 67 frames shown (6 of 11)]
[frame 34/67]
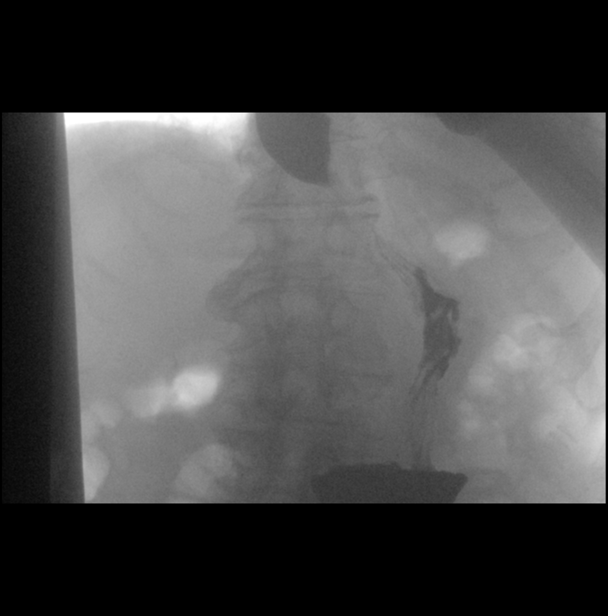
[frame 57/67]
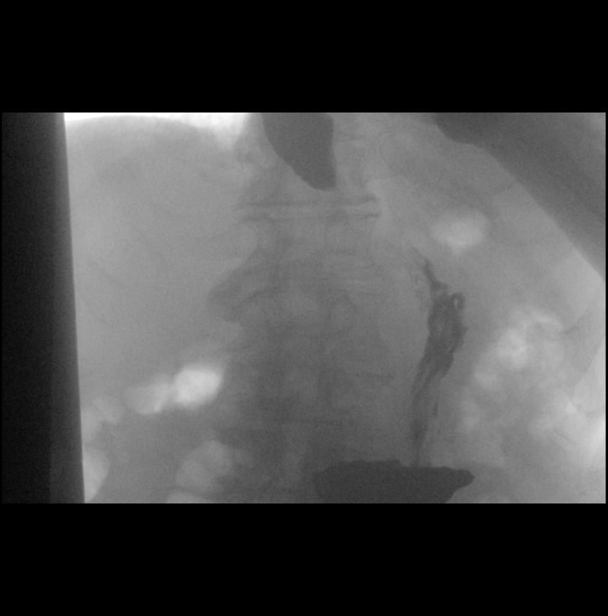

[Series 7: cp_standard · 0.26mm/px · 1 of 151 frames shown (7 of 11)]
[frame 95/151]
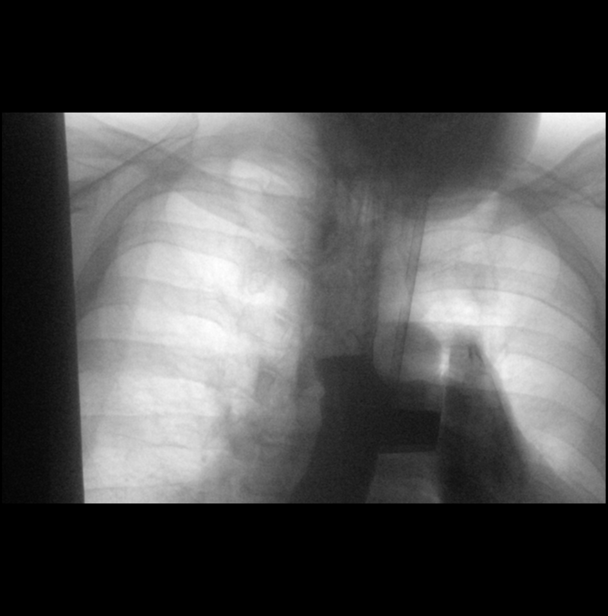

[Series 8: cp_standard · 0.26mm/px · 1 of 72 frames shown (8 of 11)]
[frame 37/72]
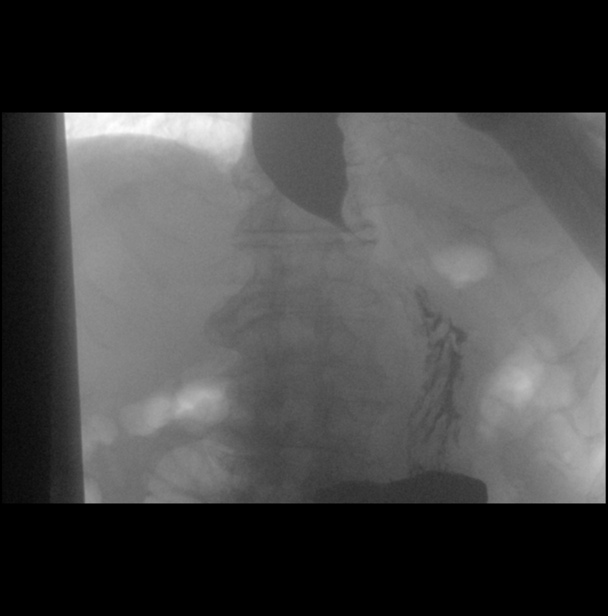

[Series 9: cp_standard · 0.26mm/px · 1 of 94 frames shown (9 of 11)]
[frame 54/94]
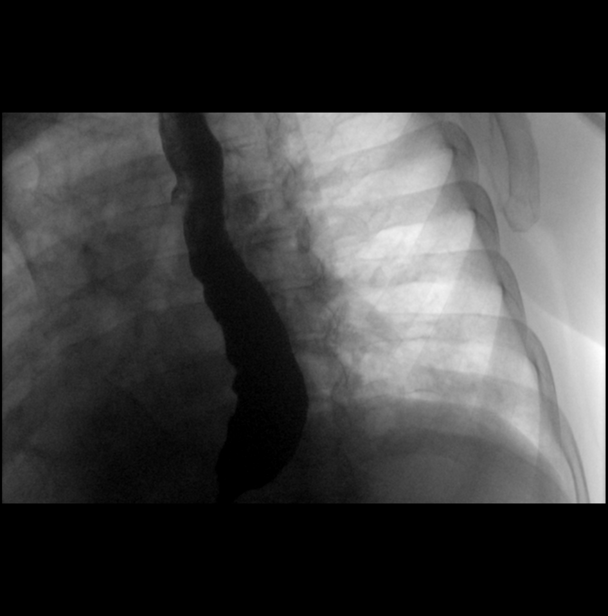

[Series 10: cp_standard · 0.26mm/px · 2 of 172 frames shown (10 of 11)]
[frame 26/172]
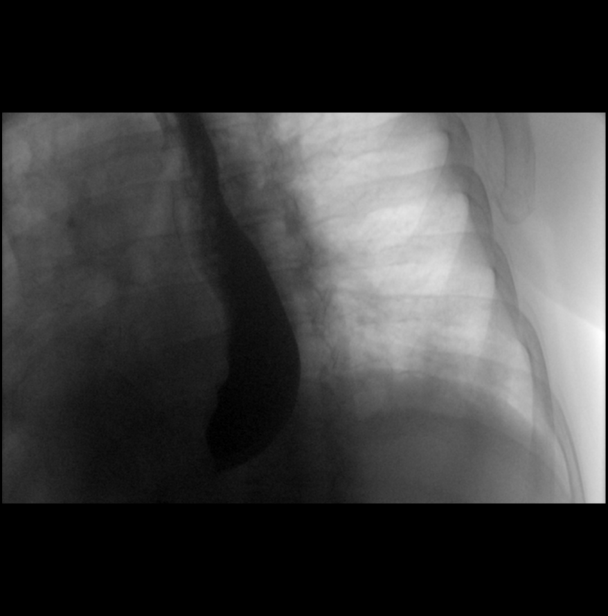
[frame 147/172]
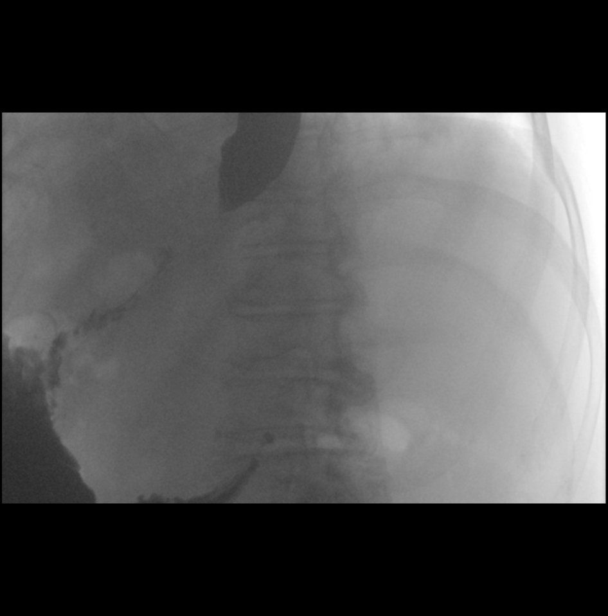

[Series 11: cp_standard · 0.26mm/px · 1 of 79 frames shown (11 of 11)]
[frame 69/79]
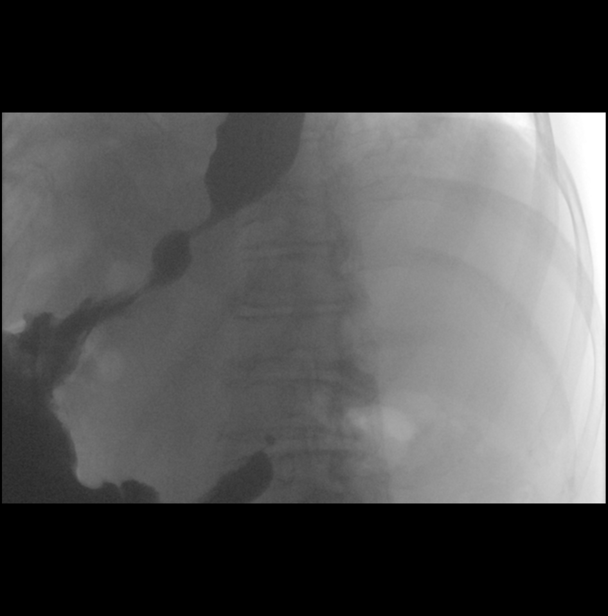

[14 of 24 positions shown; findings below may reference images not displayed]

FINDINGS: Mild tertiary contractions are noted suggesting presbyesophagus.
There is severe persistent narrowing involving the gastroesophageal
junction. Small amount of contrast was seen to passed through this
area into the stomach. This is most consistent with achalasia as
noted on prior exam. Barium tablet was not administered given this
finding.
IMPRESSION: Severe persistent narrowing of the gastroesophageal junction is
again noted consistent with achalasia, as noted on prior exam of
December 11, 2021. Mild tertiary contractions are noted suggesting
presbyesophagus.
